# Patient Record
Sex: Female | Born: 1975 | Race: Black or African American | Hispanic: No | Marital: Married | Smoking: Never smoker
Health system: Southern US, Community
[De-identification: ages and names within clinical notes are randomized; demographics above are authoritative.]

## PROBLEM LIST (undated history)

## (undated) DIAGNOSIS — J189 Pneumonia, unspecified organism: Secondary | ICD-10-CM

## (undated) DIAGNOSIS — G8929 Other chronic pain: Secondary | ICD-10-CM

## (undated) DIAGNOSIS — Z87440 Personal history of urinary (tract) infections: Secondary | ICD-10-CM

## (undated) DIAGNOSIS — J42 Unspecified chronic bronchitis: Secondary | ICD-10-CM

## (undated) DIAGNOSIS — E7800 Pure hypercholesterolemia, unspecified: Secondary | ICD-10-CM

## (undated) DIAGNOSIS — J40 Bronchitis, not specified as acute or chronic: Secondary | ICD-10-CM

## (undated) DIAGNOSIS — D649 Anemia, unspecified: Secondary | ICD-10-CM

## (undated) DIAGNOSIS — G932 Benign intracranial hypertension: Secondary | ICD-10-CM

## (undated) DIAGNOSIS — I209 Angina pectoris, unspecified: Secondary | ICD-10-CM

## (undated) DIAGNOSIS — Z8719 Personal history of other diseases of the digestive system: Secondary | ICD-10-CM

## (undated) DIAGNOSIS — R112 Nausea with vomiting, unspecified: Secondary | ICD-10-CM

## (undated) DIAGNOSIS — K5909 Other constipation: Secondary | ICD-10-CM

## (undated) DIAGNOSIS — R569 Unspecified convulsions: Secondary | ICD-10-CM

## (undated) DIAGNOSIS — Z86718 Personal history of other venous thrombosis and embolism: Secondary | ICD-10-CM

## (undated) DIAGNOSIS — I10 Essential (primary) hypertension: Secondary | ICD-10-CM

## (undated) DIAGNOSIS — K219 Gastro-esophageal reflux disease without esophagitis: Secondary | ICD-10-CM

## (undated) DIAGNOSIS — R109 Unspecified abdominal pain: Secondary | ICD-10-CM

## (undated) DIAGNOSIS — Z9889 Other specified postprocedural states: Secondary | ICD-10-CM

## (undated) DIAGNOSIS — R7303 Prediabetes: Secondary | ICD-10-CM

## (undated) DIAGNOSIS — G43909 Migraine, unspecified, not intractable, without status migrainosus: Secondary | ICD-10-CM

## (undated) DIAGNOSIS — M545 Low back pain, unspecified: Secondary | ICD-10-CM

## (undated) DIAGNOSIS — H539 Unspecified visual disturbance: Secondary | ICD-10-CM

## (undated) HISTORY — PX: OTHER SURGICAL HISTORY: SHX169

## (undated) HISTORY — DX: Gastro-esophageal reflux disease without esophagitis: K21.9

## (undated) HISTORY — DX: Benign intracranial hypertension: G93.2

## (undated) HISTORY — PX: DILATION AND CURETTAGE OF UTERUS: SHX78

## (undated) HISTORY — PX: HERNIA REPAIR: SHX51

## (undated) HISTORY — PX: LAPAROSCOPIC CHOLECYSTECTOMY: SUR755

## (undated) HISTORY — PX: LIPOSUCTION: SHX10

## (undated) HISTORY — PX: AUGMENTATION MAMMAPLASTY: SUR837

## (undated) HISTORY — PX: BREAST ENHANCEMENT SURGERY: SHX7

## (undated) HISTORY — PX: ACHILLES TENDON LENGTHENING: SUR826

## (undated) HISTORY — PX: ABDOMINAL HYSTERECTOMY: SHX81

## (undated) HISTORY — PX: ABDOMINOPLASTY: SUR9

## (undated) HISTORY — PX: LUMBAR PUNCTURE: SHX1985

---

## 2001-03-10 ENCOUNTER — Other Ambulatory Visit: Admission: RE | Admit: 2001-03-10 | Discharge: 2001-03-10 | Payer: Self-pay | Admitting: Obstetrics and Gynecology

## 2001-03-10 ENCOUNTER — Encounter (INDEPENDENT_AMBULATORY_CARE_PROVIDER_SITE_OTHER): Payer: Self-pay

## 2001-03-22 ENCOUNTER — Encounter (INDEPENDENT_AMBULATORY_CARE_PROVIDER_SITE_OTHER): Payer: Self-pay | Admitting: Specialist

## 2001-03-22 ENCOUNTER — Observation Stay (HOSPITAL_COMMUNITY): Admission: RE | Admit: 2001-03-22 | Discharge: 2001-03-23 | Payer: Self-pay | Admitting: Obstetrics and Gynecology

## 2001-04-21 ENCOUNTER — Emergency Department (HOSPITAL_COMMUNITY): Admission: EM | Admit: 2001-04-21 | Discharge: 2001-04-21 | Payer: Self-pay | Admitting: Emergency Medicine

## 2001-05-29 ENCOUNTER — Emergency Department (HOSPITAL_COMMUNITY): Admission: EM | Admit: 2001-05-29 | Discharge: 2001-05-30 | Payer: Self-pay | Admitting: *Deleted

## 2001-05-30 ENCOUNTER — Encounter: Payer: Self-pay | Admitting: *Deleted

## 2003-01-16 ENCOUNTER — Emergency Department (HOSPITAL_COMMUNITY): Admission: EM | Admit: 2003-01-16 | Discharge: 2003-01-16 | Payer: Self-pay | Admitting: Emergency Medicine

## 2003-07-31 ENCOUNTER — Encounter: Payer: Self-pay | Admitting: Emergency Medicine

## 2003-07-31 ENCOUNTER — Emergency Department (HOSPITAL_COMMUNITY): Admission: EM | Admit: 2003-07-31 | Discharge: 2003-08-01 | Payer: Self-pay | Admitting: Emergency Medicine

## 2003-10-04 ENCOUNTER — Emergency Department (HOSPITAL_COMMUNITY): Admission: EM | Admit: 2003-10-04 | Discharge: 2003-10-04 | Payer: Self-pay | Admitting: Internal Medicine

## 2003-11-27 ENCOUNTER — Ambulatory Visit (HOSPITAL_COMMUNITY): Admission: RE | Admit: 2003-11-27 | Discharge: 2003-11-27 | Payer: Self-pay | Admitting: Family Medicine

## 2004-04-15 ENCOUNTER — Emergency Department (HOSPITAL_COMMUNITY): Admission: EM | Admit: 2004-04-15 | Discharge: 2004-04-15 | Payer: Self-pay | Admitting: Emergency Medicine

## 2004-06-08 ENCOUNTER — Emergency Department (HOSPITAL_COMMUNITY): Admission: EM | Admit: 2004-06-08 | Discharge: 2004-06-08 | Payer: Self-pay | Admitting: Emergency Medicine

## 2004-11-07 ENCOUNTER — Emergency Department (HOSPITAL_COMMUNITY): Admission: EM | Admit: 2004-11-07 | Discharge: 2004-11-07 | Payer: Self-pay | Admitting: Emergency Medicine

## 2004-11-19 ENCOUNTER — Emergency Department (HOSPITAL_COMMUNITY): Admission: EM | Admit: 2004-11-19 | Discharge: 2004-11-19 | Payer: Self-pay | Admitting: Emergency Medicine

## 2004-12-10 ENCOUNTER — Emergency Department (HOSPITAL_COMMUNITY): Admission: EM | Admit: 2004-12-10 | Discharge: 2004-12-10 | Payer: Self-pay | Admitting: Family Medicine

## 2005-02-03 ENCOUNTER — Emergency Department (HOSPITAL_COMMUNITY): Admission: EM | Admit: 2005-02-03 | Discharge: 2005-02-03 | Payer: Self-pay | Admitting: Family Medicine

## 2005-03-11 ENCOUNTER — Emergency Department (HOSPITAL_COMMUNITY): Admission: EM | Admit: 2005-03-11 | Discharge: 2005-03-11 | Payer: Self-pay | Admitting: Emergency Medicine

## 2005-04-21 ENCOUNTER — Emergency Department (HOSPITAL_COMMUNITY): Admission: EM | Admit: 2005-04-21 | Discharge: 2005-04-21 | Payer: Self-pay | Admitting: Emergency Medicine

## 2005-06-13 ENCOUNTER — Emergency Department (HOSPITAL_COMMUNITY): Admission: EM | Admit: 2005-06-13 | Discharge: 2005-06-13 | Payer: Self-pay | Admitting: Family Medicine

## 2005-07-08 ENCOUNTER — Emergency Department (HOSPITAL_COMMUNITY): Admission: EM | Admit: 2005-07-08 | Discharge: 2005-07-08 | Payer: Self-pay | Admitting: Emergency Medicine

## 2005-09-03 ENCOUNTER — Emergency Department (HOSPITAL_COMMUNITY): Admission: EM | Admit: 2005-09-03 | Discharge: 2005-09-03 | Payer: Self-pay | Admitting: Family Medicine

## 2005-09-03 ENCOUNTER — Emergency Department (HOSPITAL_COMMUNITY): Admission: EM | Admit: 2005-09-03 | Discharge: 2005-09-03 | Payer: Self-pay | Admitting: Emergency Medicine

## 2006-03-02 ENCOUNTER — Emergency Department (HOSPITAL_COMMUNITY): Admission: EM | Admit: 2006-03-02 | Discharge: 2006-03-02 | Payer: Self-pay | Admitting: Emergency Medicine

## 2006-06-17 ENCOUNTER — Ambulatory Visit (HOSPITAL_COMMUNITY): Admission: RE | Admit: 2006-06-17 | Discharge: 2006-06-17 | Payer: Self-pay | Admitting: Family Medicine

## 2006-10-06 HISTORY — PX: COLONOSCOPY: SHX174

## 2006-12-28 ENCOUNTER — Emergency Department (HOSPITAL_COMMUNITY): Admission: EM | Admit: 2006-12-28 | Discharge: 2006-12-28 | Payer: Self-pay | Admitting: Family Medicine

## 2006-12-30 ENCOUNTER — Emergency Department (HOSPITAL_COMMUNITY): Admission: EM | Admit: 2006-12-30 | Discharge: 2006-12-30 | Payer: Self-pay | Admitting: Emergency Medicine

## 2007-06-01 ENCOUNTER — Emergency Department: Payer: Self-pay | Admitting: Emergency Medicine

## 2007-06-01 ENCOUNTER — Other Ambulatory Visit: Payer: Self-pay

## 2007-07-26 ENCOUNTER — Other Ambulatory Visit: Admission: RE | Admit: 2007-07-26 | Discharge: 2007-07-26 | Payer: Self-pay | Admitting: Obstetrics and Gynecology

## 2007-08-13 ENCOUNTER — Ambulatory Visit: Payer: Self-pay | Admitting: Internal Medicine

## 2007-08-20 ENCOUNTER — Ambulatory Visit (HOSPITAL_COMMUNITY): Admission: RE | Admit: 2007-08-20 | Discharge: 2007-08-20 | Payer: Self-pay | Admitting: Internal Medicine

## 2007-08-20 ENCOUNTER — Ambulatory Visit: Payer: Self-pay | Admitting: Internal Medicine

## 2007-08-25 ENCOUNTER — Ambulatory Visit (HOSPITAL_COMMUNITY): Admission: RE | Admit: 2007-08-25 | Discharge: 2007-08-25 | Payer: Self-pay | Admitting: *Deleted

## 2007-10-07 HISTORY — PX: ESOPHAGOGASTRODUODENOSCOPY: SHX1529

## 2008-04-21 ENCOUNTER — Ambulatory Visit (HOSPITAL_COMMUNITY): Admission: RE | Admit: 2008-04-21 | Discharge: 2008-04-21 | Payer: Self-pay | Admitting: Family Medicine

## 2008-04-25 ENCOUNTER — Ambulatory Visit (HOSPITAL_COMMUNITY): Admission: RE | Admit: 2008-04-25 | Discharge: 2008-04-25 | Payer: Self-pay | Admitting: Obstetrics & Gynecology

## 2008-05-04 ENCOUNTER — Ambulatory Visit (HOSPITAL_COMMUNITY): Admission: RE | Admit: 2008-05-04 | Discharge: 2008-05-04 | Payer: Self-pay | Admitting: Family Medicine

## 2008-05-09 ENCOUNTER — Ambulatory Visit (HOSPITAL_COMMUNITY): Admission: RE | Admit: 2008-05-09 | Discharge: 2008-05-09 | Payer: Self-pay | Admitting: Family Medicine

## 2008-05-18 ENCOUNTER — Ambulatory Visit: Payer: Self-pay | Admitting: Internal Medicine

## 2008-05-22 ENCOUNTER — Ambulatory Visit (HOSPITAL_COMMUNITY): Admission: RE | Admit: 2008-05-22 | Discharge: 2008-05-22 | Payer: Self-pay | Admitting: Internal Medicine

## 2008-05-22 ENCOUNTER — Ambulatory Visit: Payer: Self-pay | Admitting: Internal Medicine

## 2008-05-24 ENCOUNTER — Encounter: Payer: Self-pay | Admitting: Orthopedic Surgery

## 2008-05-24 ENCOUNTER — Emergency Department (HOSPITAL_COMMUNITY): Admission: EM | Admit: 2008-05-24 | Discharge: 2008-05-24 | Payer: Self-pay | Admitting: Emergency Medicine

## 2008-05-29 ENCOUNTER — Ambulatory Visit: Payer: Self-pay | Admitting: Orthopedic Surgery

## 2008-05-29 DIAGNOSIS — M66369 Spontaneous rupture of flexor tendons, unspecified lower leg: Secondary | ICD-10-CM

## 2008-06-08 ENCOUNTER — Ambulatory Visit (HOSPITAL_COMMUNITY): Admission: RE | Admit: 2008-06-08 | Discharge: 2008-06-08 | Payer: Self-pay | Admitting: Family Medicine

## 2008-06-19 ENCOUNTER — Ambulatory Visit: Payer: Self-pay | Admitting: Internal Medicine

## 2008-06-28 ENCOUNTER — Ambulatory Visit (HOSPITAL_COMMUNITY): Admission: RE | Admit: 2008-06-28 | Discharge: 2008-06-28 | Payer: Self-pay | Admitting: Family Medicine

## 2008-06-29 ENCOUNTER — Telehealth: Payer: Self-pay | Admitting: Orthopedic Surgery

## 2008-07-04 ENCOUNTER — Ambulatory Visit: Payer: Self-pay | Admitting: Unknown Physician Specialty

## 2008-07-05 ENCOUNTER — Ambulatory Visit: Payer: Self-pay | Admitting: Unknown Physician Specialty

## 2008-07-14 ENCOUNTER — Ambulatory Visit (HOSPITAL_COMMUNITY): Admission: RE | Admit: 2008-07-14 | Discharge: 2008-07-14 | Payer: Self-pay | Admitting: Family Medicine

## 2008-07-17 ENCOUNTER — Ambulatory Visit: Payer: Self-pay | Admitting: Cardiology

## 2008-07-17 ENCOUNTER — Inpatient Hospital Stay (HOSPITAL_COMMUNITY): Admission: AD | Admit: 2008-07-17 | Discharge: 2008-08-04 | Payer: Self-pay | Admitting: Family Medicine

## 2008-07-18 ENCOUNTER — Encounter: Payer: Self-pay | Admitting: Cardiology

## 2008-07-18 ENCOUNTER — Ambulatory Visit: Payer: Self-pay | Admitting: Internal Medicine

## 2008-07-19 ENCOUNTER — Ambulatory Visit: Payer: Self-pay | Admitting: Gastroenterology

## 2008-07-23 ENCOUNTER — Ambulatory Visit: Payer: Self-pay | Admitting: Gastroenterology

## 2008-07-24 ENCOUNTER — Ambulatory Visit: Payer: Self-pay | Admitting: Internal Medicine

## 2008-07-25 ENCOUNTER — Ambulatory Visit: Payer: Self-pay | Admitting: Gastroenterology

## 2008-07-26 ENCOUNTER — Ambulatory Visit: Payer: Self-pay | Admitting: Gastroenterology

## 2008-07-27 ENCOUNTER — Ambulatory Visit: Payer: Self-pay | Admitting: Internal Medicine

## 2008-07-31 ENCOUNTER — Ambulatory Visit: Payer: Self-pay | Admitting: Gastroenterology

## 2008-08-02 ENCOUNTER — Encounter (INDEPENDENT_AMBULATORY_CARE_PROVIDER_SITE_OTHER): Payer: Self-pay | Admitting: General Surgery

## 2008-08-10 ENCOUNTER — Ambulatory Visit: Payer: Self-pay | Admitting: Internal Medicine

## 2009-02-01 ENCOUNTER — Ambulatory Visit (HOSPITAL_COMMUNITY): Admission: RE | Admit: 2009-02-01 | Discharge: 2009-02-01 | Payer: Self-pay | Admitting: Family Medicine

## 2009-08-06 ENCOUNTER — Emergency Department (HOSPITAL_COMMUNITY): Admission: EM | Admit: 2009-08-06 | Discharge: 2009-08-06 | Payer: Self-pay | Admitting: Emergency Medicine

## 2009-12-25 ENCOUNTER — Ambulatory Visit (HOSPITAL_COMMUNITY): Admission: RE | Admit: 2009-12-25 | Discharge: 2009-12-25 | Payer: Self-pay | Admitting: Family Medicine

## 2010-01-14 ENCOUNTER — Ambulatory Visit (HOSPITAL_COMMUNITY): Admission: RE | Admit: 2010-01-14 | Discharge: 2010-01-14 | Payer: Self-pay | Admitting: Family Medicine

## 2010-06-21 ENCOUNTER — Ambulatory Visit (HOSPITAL_COMMUNITY): Admission: RE | Admit: 2010-06-21 | Discharge: 2010-06-21 | Payer: Self-pay | Admitting: Family Medicine

## 2010-07-01 ENCOUNTER — Emergency Department (HOSPITAL_COMMUNITY): Admission: EM | Admit: 2010-07-01 | Discharge: 2010-07-01 | Payer: Self-pay | Admitting: Emergency Medicine

## 2010-08-21 ENCOUNTER — Emergency Department (HOSPITAL_COMMUNITY): Admission: EM | Admit: 2010-08-21 | Discharge: 2010-08-21 | Payer: Self-pay | Admitting: Emergency Medicine

## 2010-10-09 ENCOUNTER — Emergency Department: Payer: Self-pay | Admitting: Emergency Medicine

## 2010-10-28 ENCOUNTER — Encounter: Payer: Self-pay | Admitting: Emergency Medicine

## 2010-11-21 ENCOUNTER — Ambulatory Visit (HOSPITAL_COMMUNITY)
Admission: RE | Admit: 2010-11-21 | Discharge: 2010-11-21 | Disposition: A | Discharge status: Home / Self Care | Payer: Medicaid Other | Source: Ambulatory Visit | Attending: Family Medicine | Admitting: Family Medicine

## 2010-11-21 ENCOUNTER — Other Ambulatory Visit (HOSPITAL_COMMUNITY): Payer: Self-pay | Admitting: Family Medicine

## 2010-11-21 DIAGNOSIS — R05 Cough: Secondary | ICD-10-CM | POA: Insufficient documentation

## 2010-11-21 DIAGNOSIS — R059 Cough, unspecified: Secondary | ICD-10-CM | POA: Insufficient documentation

## 2010-11-21 DIAGNOSIS — R509 Fever, unspecified: Secondary | ICD-10-CM

## 2010-11-21 DIAGNOSIS — R062 Wheezing: Secondary | ICD-10-CM | POA: Insufficient documentation

## 2010-11-21 DIAGNOSIS — I10 Essential (primary) hypertension: Secondary | ICD-10-CM | POA: Insufficient documentation

## 2010-12-18 ENCOUNTER — Other Ambulatory Visit: Payer: Self-pay | Admitting: Neurology

## 2010-12-18 DIAGNOSIS — D496 Neoplasm of unspecified behavior of brain: Secondary | ICD-10-CM

## 2010-12-19 LAB — DIFFERENTIAL
Basophils Absolute: 0.1 10*3/uL (ref 0.0–0.1)
Basophils Relative: 1 % (ref 0–1)
Eosinophils Absolute: 1 10*3/uL — ABNORMAL HIGH (ref 0.0–0.7)
Neutro Abs: 4.7 10*3/uL (ref 1.7–7.7)
Neutrophils Relative %: 61 % (ref 43–77)

## 2010-12-19 LAB — BASIC METABOLIC PANEL
BUN: 7 mg/dL (ref 6–23)
Calcium: 8.7 mg/dL (ref 8.4–10.5)
Creatinine, Ser: 0.69 mg/dL (ref 0.4–1.2)
GFR calc non Af Amer: >60 mL/min (ref >60)
Glucose, Bld: 93 mg/dL (ref 70–99)
Sodium: 139 mEq/L (ref 135–145)

## 2010-12-19 LAB — CBC
Hemoglobin: 13.1 g/dL (ref 12.0–15.0)
MCH: 29.9 pg (ref 26.0–34.0)
MCHC: 33.9 g/dL (ref 30.0–36.0)
Platelets: 362 10*3/uL (ref 150–400)
RDW: 13.8 % (ref 11.5–15.5)

## 2010-12-23 ENCOUNTER — Ambulatory Visit (HOSPITAL_COMMUNITY)
Admission: RE | Admit: 2010-12-23 | Discharge: 2010-12-23 | Disposition: A | Discharge status: Home / Self Care | Payer: Medicaid Other | Source: Ambulatory Visit | Attending: Neurology | Admitting: Neurology

## 2010-12-23 DIAGNOSIS — G932 Benign intracranial hypertension: Secondary | ICD-10-CM | POA: Insufficient documentation

## 2010-12-23 DIAGNOSIS — D496 Neoplasm of unspecified behavior of brain: Secondary | ICD-10-CM

## 2010-12-23 DIAGNOSIS — R51 Headache: Secondary | ICD-10-CM | POA: Insufficient documentation

## 2010-12-23 LAB — CSF CELL COUNT WITH DIFFERENTIAL: Tube #: 3

## 2010-12-23 LAB — PROTEIN AND GLUCOSE, CSF: Total  Protein, CSF: 19 mg/dL (ref 15–45)

## 2010-12-25 ENCOUNTER — Emergency Department (HOSPITAL_COMMUNITY)
Admission: EM | Admit: 2010-12-25 | Discharge: 2010-12-25 | Disposition: A | Discharge status: Home / Self Care | Payer: Medicaid Other | Attending: Emergency Medicine | Admitting: Emergency Medicine

## 2010-12-25 ENCOUNTER — Emergency Department (HOSPITAL_COMMUNITY): Payer: Medicaid Other

## 2010-12-25 DIAGNOSIS — R110 Nausea: Secondary | ICD-10-CM | POA: Insufficient documentation

## 2010-12-25 DIAGNOSIS — H53149 Visual discomfort, unspecified: Secondary | ICD-10-CM | POA: Insufficient documentation

## 2010-12-25 DIAGNOSIS — R51 Headache: Secondary | ICD-10-CM | POA: Insufficient documentation

## 2010-12-26 ENCOUNTER — Other Ambulatory Visit: Payer: Self-pay | Admitting: Neurology

## 2010-12-26 DIAGNOSIS — G971 Other reaction to spinal and lumbar puncture: Secondary | ICD-10-CM

## 2010-12-27 ENCOUNTER — Ambulatory Visit
Admission: RE | Admit: 2010-12-27 | Discharge: 2010-12-27 | Disposition: A | Discharge status: Home / Self Care | Payer: Medicaid Other | Source: Ambulatory Visit | Attending: Neurology | Admitting: Neurology

## 2010-12-27 ENCOUNTER — Other Ambulatory Visit: Payer: Medicaid Other

## 2010-12-27 DIAGNOSIS — G971 Other reaction to spinal and lumbar puncture: Secondary | ICD-10-CM

## 2011-01-08 LAB — COMPREHENSIVE METABOLIC PANEL
AST: 16 U/L (ref 0–37)
Albumin: 3.5 g/dL (ref 3.5–5.2)
BUN: 8 mg/dL (ref 6–23)
Chloride: 104 mEq/L (ref 96–112)
Creatinine, Ser: 0.65 mg/dL (ref 0.4–1.2)
GFR calc Af Amer: >60 mL/min (ref >60)
Potassium: 3.4 mEq/L — ABNORMAL LOW (ref 3.5–5.1)
Total Bilirubin: 0.6 mg/dL (ref 0.3–1.2)
Total Protein: 6.7 g/dL (ref 6.0–8.3)

## 2011-01-08 LAB — DIFFERENTIAL
Eosinophils Relative: 4 % (ref 0–5)
Lymphocytes Relative: 24 % (ref 12–46)
Monocytes Absolute: 0.5 10*3/uL (ref 0.1–1.0)
Monocytes Relative: 7 % (ref 3–12)
Neutro Abs: 4.6 10*3/uL (ref 1.7–7.7)

## 2011-01-08 LAB — POCT CARDIAC MARKERS
CKMB, poc: <1 ng/mL — ABNORMAL LOW (ref 1.0–8.0)
CKMB, poc: <1 ng/mL — ABNORMAL LOW (ref 1.0–8.0)
Myoglobin, poc: 13.2 ng/mL (ref 12–200)
Troponin i, poc: <0.05 ng/mL (ref 0.00–0.09)

## 2011-01-08 LAB — CBC
HCT: 38 % (ref 36.0–46.0)
MCV: 88.5 fL (ref 78.0–100.0)
Platelets: 322 10*3/uL (ref 150–400)
RDW: 13.2 % (ref 11.5–15.5)
WBC: 7.1 10*3/uL (ref 4.0–10.5)

## 2011-01-08 LAB — D-DIMER, QUANTITATIVE: D-Dimer, Quant: 0.76 ug/mL-FEU — ABNORMAL HIGH (ref 0.00–0.48)

## 2011-01-12 ENCOUNTER — Emergency Department (HOSPITAL_COMMUNITY): Payer: Medicaid Other

## 2011-01-12 ENCOUNTER — Emergency Department (HOSPITAL_COMMUNITY)
Admission: EM | Admit: 2011-01-12 | Discharge: 2011-01-12 | Disposition: A | Discharge status: Home / Self Care | Payer: Medicaid Other | Attending: Emergency Medicine | Admitting: Emergency Medicine

## 2011-01-12 DIAGNOSIS — R0602 Shortness of breath: Secondary | ICD-10-CM | POA: Insufficient documentation

## 2011-01-12 DIAGNOSIS — I10 Essential (primary) hypertension: Secondary | ICD-10-CM | POA: Insufficient documentation

## 2011-01-12 DIAGNOSIS — J45909 Unspecified asthma, uncomplicated: Secondary | ICD-10-CM | POA: Insufficient documentation

## 2011-01-12 DIAGNOSIS — K219 Gastro-esophageal reflux disease without esophagitis: Secondary | ICD-10-CM | POA: Insufficient documentation

## 2011-02-18 NOTE — Group Therapy Note (Signed)
[name], [name]               ACCOUNT NO.:  [id number]   MEDICAL RECORD NO.:  [id number]          PATIENT TYPE:  INP   LOCATION:  A317                          FACILITY:  [facility]   PHYSICIAN:  Kofi A. Gerilyn Pilgrim, M.D. DATE OF BIRTH:  Dec 03, 1975   DATE OF PROCEDURE:  DATE OF DISCHARGE:                                 PROGRESS NOTE   The patient does not report having headaches or blurred vision.  She  continues to have significant discomfort involving the chest,  epigastrium, and the abdominal region.  It appears that the feeling that  she has significant gastroesophageal reflux disease.  Temperature 98.2,  pulse 85, and blood pressure 103/67.  She is awake and alert and  converses well.  Speech, language, and cognition are intact.  She moves  all 4 extremities.   ASSESSMENT AND PLAN:  Pseudotumor cerebri, which seems to responded well  to spinal tap.  I would continue with the Diamox and Topamax for  continued medical treatment.  We will sign off, consult p.r.n.      Kofi A. Gerilyn Pilgrim, M.D.  Electronically Signed     KAD/MEDQ  D:  07/28/2008  T:  07/29/2008  Job:  [id number]

## 2011-02-18 NOTE — Consult Note (Signed)
NAMEROSENE, PILLING NO.:  [id number]   MEDICAL RECORD NO.:  [id number]          PATIENT TYPE:  INP   LOCATION:  A317                          FACILITY:  [facility]   PHYSICIAN:  Tilford Pillar, MD      DATE OF BIRTH:  1976-06-02   DATE OF CONSULTATION:  DATE OF DISCHARGE:                                 CONSULTATION   REASON FOR CONSULTATION:  Abdominal pain.   HISTORY OF PRESENT ILLNESS:  The patient is a 35 year old female who  presented to [facility] with a history of chronic constipation,  gastroesophageal reflux disease, and known right ovarian vein  thrombosis.  The patient presented with relatively diffuse abdominal  pain and has been admitted for extensive workup.  At this time, she  complains of mostly left-sided abdominal pain, but also epigastric and  right upper quadrant abdominal discomfort.  She does have occasional  episodes of nausea and has had an episode of emesis.  This last episode  of emesis was consistent with coffee-ground emesis consistent with  hematemesis.  No frank blood was evident.  She has had history of blood  with bowel movements.  However, no recent episodes and has had a recent  colonoscopy as well as upper endoscopy.  She is unaware of any family  history of biliary disease, but she has had previous pregnancies and  complains of increased symptomatology with oral ingestion just with her  fatty greasy foods. In addition, the patient has had a HIDA scan and in  discussion with the patient, her symptoms were exacerbated by the oral  administration of __________   PAST MEDICAL HISTORY:  1. Chronic constipation.  2. GERD.  3. Hypertension.  4. Hypercholesterolemia.  5. History of hydrocephaly.  6. History of right ovarian vein thrombosis.   PAST SURGICAL HISTORY:  She has had a left Achilles tendon repair.  She  has had ruptured disk as well as partial hysterectomy.   MEDICATIONS:  1. Lipitor.  2. Coumadin.  3.  Singulair.  4. Percocet.  5. Lisinopril and hydrochlorothiazide.  6. Prilosec.  7. Xanax.   ALLERGIES:  No known drug allergies.   SOCIAL HISTORY:  No tobacco use.  Occasional alcohol use.  Distant  marijuana use.  She has had three pregnancies.   PHYSICAL EXAMINATION:  VITAL SIGNS:  Temperature 98.1, heart rate 84,  respirations 21, blood pressure 142/68, she has 98% O2 saturation on  room air.  She has had 3 episode of urine output today.  Volumes were  not recorded.  GENERAL:  She is an obese female in no acute distress.  She is alert and  oriented x3.  HEENT:  Pupils are equal, round, reactive.  Extraocular movements are  intact.  No conjunctival pallor or scleral icterus noted.  Oral mucosa  is pink and moist.  Normal occlusion.  NECK:  Trachea is midline.  No cervical lymphadenopathy apparent.  PULMONARY:  Unlabored respirations.  She is clear to auscultation  bilaterally.  CARDIOVASCULAR:  Regular rate and rhythm.  She has 2+ radial pulse  bilaterally.  ABDOMEN:  Positive  bowel sounds.  Abdomen is obese, soft.  She has  moderate epigastric and right upper quadrant abdominal pain.  No  peritoneal signs are elicited.  No masses.  No hernias.  EXTREMITIES:  Warm and dry.   PERTINENT LABORATORY AND RADIOGRAPHIC STUDIES:  CBC; white blood cell  count 8.7, hemoglobin 11.2, hematocrit 32.4, platelets 420.  This was on  July 30, 2008.  Her INR state was 1.4.  HIDA scan demonstrates a 9%  ejection fraction and again her symptomatology is exacerbated with the  oral administration of __________.   ASSESSMENT AND PLAN:  Biliary dyskinesia.  At this point, I did have a  relatively lengthy conversation with the patient in regards to her  physical findings as well as her laboratory and radiographic study.  As  she does have a history of chronic constipation, reflux, as well as  gastritis, it also appears that she does have __________ disease and  biliary dyskinesia.  I have  discussed with her and her husband who was  present at the time of the evaluation and discussion.  I have a  suspicion all of her symptomatology is associated with her biliary  dyskinesia is relatively well; however, I do think that the benefit of  discussing laparoscopic possible cholecystectomy.  I did discuss the  risks, benefits, and alternatives at length with the patient and her  husband including but not limited to risk of bleeding, infection, bile  leak as well as possibility of common bile duct injury as well as  possibility of intraoperative cardiac and pulmonary events.  At this  time as she is on Coumadin, this will need to be discontinued prior to  any surgical intervention.  However, at this time, __________ levels are  nontherapeutic and at this time, I would discontinue her Lovenox.  I  would anticipate reevaluating her INR in the morning prior to any  planned operative intervention.  Their questions and concerns were  addressed.  The patient does wish to proceed with a laparoscopic  possible open cholecystectomy on admission.  At this time, I will plan  for surgical intervention likely tomorrow pending the results of her  morning coag studies.  At this point, she will be kept n.p.o. after  midnight and __________ intervention can would be continued on IV fluid  hydration and her Lovenox can be discontinued after midnight tonight  __________  operation.      Tilford Pillar, MD  Electronically Signed     BZ/MEDQ  D:  08/02/2008  T:  08/02/2008  Job:  [id number]   cc:   Angus G. Renard Matter, MD  Fax: [phone]

## 2011-02-18 NOTE — Op Note (Signed)
NAMECHERRE, KOTHARI               ACCOUNT NO.:  [id number]   MEDICAL RECORD NO.:  [id number]          PATIENT TYPE:  INP   LOCATION:  A317                          FACILITY:  [facility]   PHYSICIAN:  R. Roetta Sessions, M.D. DATE OF BIRTH:  Dec 17, 1975   DATE OF PROCEDURE:  07/24/2008  DATE OF DISCHARGE:                               OPERATIVE REPORT   INDICATIONS FOR PROCEDURE:  A 35 year old obese African American female  with symptoms of chronic nausea, reflux, and regurgitation, atypical  chest pain in spite of Protonix 40 mg b.i.d.  Bravo study now being  performed.  Risks, benefits, alternatives, and limitations have been  reviewed.  Please see the EGD report under separate cover.  EGD was  normal.   Please note this study was done while on Protonix 40 mg orally q.12 h.   FINDINGS:  This was an abnormal study indicating excessive  gastroesophageal reflux breaking through acid suppression therapy.  She  had multiple episodes of gastroesophageal reflux through the 48-hour  study with relatively good correlation with symptoms of regurgitation  and chest pain during the study done.  Her DeMeester score on day 1 was  11.2.  Her DeMeester score in the day 2 was 17.4.  Acid reflux analysis  total during the study.  Number of refluxes during the study period 139.  Number of long refluxes greater than 5 minutes one; duration of longest  reflux in minutes 11.  Time pH less than 4 in minutes during study 76,  fraction time pH less than 3%.   This study was done, closely monitored, environment.  The patient was on  Protonix during the study.  This study indicates poorly-controlled  gastroesophageal reflux with breakthrough symptoms on b.i.d. PPI  therapy.   Clinically, this is a drug failure.   RECOMMENDATIONS:  1. We will begin the patient immediately on Zegerid 40 mg capsule one      q.12 h.  Hopefully, we will get a much better clinical response      with this agent within the next few  days.  2. I have also advocated antireflux measures including dietary      modification and weight loss as part of the multipronged approach      to the treatment of gastroesophageal reflux disease.      Jonathon Bellows, M.D.  Electronically Signed     RMR/MEDQ  D:  07/27/2008  T:  07/27/2008  Job:  [id number]   cc:   Angus G. Renard Matter, MD  Fax: [phone]   Hospital Chart

## 2011-02-18 NOTE — Consult Note (Signed)
[name], [name]               ACCOUNT NO.:  [id number]   MEDICAL RECORD NO.:  [id number]          PATIENT TYPE:  INP   LOCATION:  A317                          FACILITY:  [facility]   PHYSICIAN:  R. Roetta Sessions, M.D. DATE OF BIRTH:  April 07, 1976   DATE OF CONSULTATION:  DATE OF DISCHARGE:                                 CONSULTATION   REFERRING PHYSICIAN:  Dr. Renard Matter.   REASON FOR CONSULTATION:  Abnormal stomach on CT.   HISTORY OF PRESENT ILLNESS:  [name] is a 35 year old African American  female.  She is well-known to Dr. Jena Gauss with history of chronic  constipation, right ovarian vein thrombosis, and GERD.  She did have an  EGD which was normal on May 22, 2008 by Dr. Jena Gauss.  She was found to  have a markedly distended stomach with possible goo.  The stomach was  distended with contrast.  She has had stable chronic right ovarian vein  thrombosis and a new mild infiltrate in the right upper lobe suggestive  of pneumonia on CT from July 14, 2008.  She tells me she has had chest  pain for the last 2 or 3 years, and it has worsened over the last 2  months or so.  She has had sharp shooting pain across her chest.  Her  chest hurts with inspiration.  She continues to complain of chronic  constipation.  She has been taking Epsom salts previously.  She has  taken Amitiza 24 mcg in the morning and 8 mcg in the evening which does  seem to help, but she has run out.  She also notes that if she takes it  several days in a row, it does not work as well.  She has had  intermittent nausea and vomiting and some gagging.  She tells me that  she has not had any vomiting in several days, however.  She does have  heartburn, indigestion.  She has been taking some over-the-counter  Prilosec, but admits to only taking it on a p.r.n. basis, not every day.  She denies any melena.  She has had scant hematochezia.  She notes that  MiraLax did not seem to help with her constipation.  She last took 5  Dulcolax which did seem to help, but she constantly feels as though she  has incomplete evacuation.   PAST MEDICAL/SURGICAL HISTORY:  1. Chronic constipation.  2. Chronic GERD with normal EGD May 22, 2008 on PPI.  She had an      upper GI series, barium swallow esophagram on August 4th which      showed mild duodenitis.  This was not confirmed on EGD from May 22, 2008.  3. She had a colonoscopy by Dr. Jena Gauss August 20, 2007.  It was found      to have a friable anal canal.  4. She had a left Achilles tendon repair recently in [place].  She      still has sutures in place.  5. She has hypertension.  6. Hypercholesterolemia.  7. History of ruptured disk.  8.  Partial hysterectomy for menorrhagia.  9. She has history of hydrocephalous and some type of vaginal surgery      as a child.   MEDICATIONS PRIOR TO ADMISSION:  1. Lipitor 20 mg daily.  2. Coumadin 5 mg daily.  3. Singulair 10 mg daily.  4. Percocet 10/650 mg q.4 h. p.r.n.  5. Lisinopril/HCTZ 10/12.5 mg daily.  6. Prilosec 20 mg p.r.n.  7. Xanax 0.5 mg p.r.n.   ALLERGIES:  No known drug allergies.   FAMILY HISTORY:  There is no known family history of  GI carcinoma or  lower chronic GI problems.  Mother has diabetes.  Father was killed in a  car accident at age 60.   SOCIAL HISTORY:  [name] is married.  She has 3 healthy children.  She  is pursuing her GED.  She rarely consumes alcohol usually on holidays.  She does have remote history of marijuana use.  She has been unemployed  for about a year.  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   REVIEW OF SYSTEMS:  See HPI.  PULMONARY:  She has had some cough, some  shortness of breath on exertion; otherwise, negative.  CONSTITUTIONAL:  Some fatigue; otherwise, negative review of systems.   PHYSICAL EXAMINATION:  VITAL SIGNS:  Weight 111.7 kg, height 69 inches,  temp 97.3, pulse 62, respirations 18, blood pressure 109/60.  GENERAL:  [name]  is an obese African American female who is alert,  oriented, pleasant, cooperative, in no acute distress.  HEENT.  Sclerae clear, nonicteric, conjunctivae pink.  Oropharynx pink  and moist without any lesions.  NECK:  Supple without any masses or thyromegaly.  CHEST:  Heart regular rate and rhythm, normal S1, S2, no murmurs,  clicks, rubs or gallops.  LUNGS:  With decreased breath sounds bilaterally.  She does have  expiratory wheeze on the right.  No acute distress.  ABDOMEN:  Protuberant with positive bowel sounds x4.  No bruits  auscultated.  Soft, nontender, nondistended without palpable mass or  hepatosplenomegaly.  No rebound tenderness or guarding.  EXTREMITIES:  Without clubbing or edema.  She does have left ankle  sutures in place from previous surgery.   LABORATORY STUDIES:  WBC 7.3, hemoglobin 11.8, hematocrit 33.7,  platelets 406,000.  PT 20, INR 1.6.  Calcium 9.3.  Sodium 137, potassium  2.1, chloride 100, CO2 29, BUN 12, creatinine 0.71, glucose 106.  Total  bilirubin 0.2, alkaline phosphatase 54.  AST 18, ALT 20.  Total protein  7.1.  Albumin 3.7.   IMPRESSION:  [name] is a 35 year old female with multiple chronic GI  problems.  She is well-known to Dr. Luvenia Starch service with history of  chronic constipation, chronic abdominal pelvic pain with a right ovarian  vein thrombosis and chronic gastroesophageal reflux disease.  She has a  history of a recent normal esophagogastroduodenoscopy by Dr. Jena Gauss April 21, 2008.  Abnormal appearance of markedly distended stomach and  possible gastric outlet obstruction as no contrast emptied into the  small bowel is nonspecific.  There is no history of goo on recent EGD or  upper GI series.   Acute problems include pneumonia and chest pain with pending cardiac  workup along with hypokalemia and left Achilles tendon repair healing  well.   PLAN:  1. Would increase Amitiza to 24 mcg b.i.d. once she recovers from      acute  illness.  2. Can consider sitz marker study on an outpatient  basis for her      chronic constipation.  3. She needs PPI daily.  4. Agree with cardiology evaluation.  5. Will have Dr. Jena Gauss review CT images and make any further      suggestions regarding her distended stomach full of contrast.   We would like to thank Dr. Renard Matter for allowing [place] to participate in the  care of [name].      Lorenza Burton, N.P.      Jonathon Bellows, M.D.  Electronically Signed    KJ/MEDQ  D:  07/18/2008  T:  07/18/2008  Job:  [id number]   cc:   Angus G. Renard Matter, MD  Fax: [phone]

## 2011-02-18 NOTE — Group Therapy Note (Signed)
[name], [name]               ACCOUNT NO.:  [id number]   MEDICAL RECORD NO.:  [id number]          PATIENT TYPE:  INP   LOCATION:  A317                          FACILITY:  [facility]   PHYSICIAN:  Angus G. Renard Matter, MD   DATE OF BIRTH:  03/22/1976   DATE OF PROCEDURE:  DATE OF DISCHARGE:                                 PROGRESS NOTE   This patient continues to have anterior chest pain and pain in upper  abdomen.  She does have recurrent pseudotumor cerebri had been treated  with Diamox, also had been evaluated by Gastroenterology Service and  felt to have reflux.  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  Recent cardiac panel; CPK 43, CPK-  MB 0.7, troponin 0.03.   PHYSICAL EXAMINATION:  VITAL SIGNS:  Blood pressure 103/63, respirations  18, pulse 85, temperature 98.2.  Recent CBC within normal range.  LUNGS:  Clear.  HEART:  Regular rhythm.  ABDOMEN:  No palpable organs or masses.   ASSESSMENT:  The patient has multiple medical problems, history of  ovarian vein thrombosis, history of left Achilles tendon repair, known  duodenitis, gastroesophageal reflux, pneumonia which is improved,  pseudotumor cerebri, possible underlying coronary artery disease.   PLAN:  To obtain Cardiology consult.  Continue other medications as  ordered.      Angus G. Renard Matter, MD  Electronically Signed     AGM/MEDQ  D:  07/28/2008  T:  07/28/2008  Job:  [id number]

## 2011-02-18 NOTE — Assessment & Plan Note (Signed)
NAMEMarland Kitchen  BRANTLEY, NASER                CHART#:  [id number]   DATE:  06/19/2008                       DOB:  1975/11/14   CHIEF COMPLAINT:  Followup of abdominal pain, nausea, and recent EGD.   PROBLEM LIST:  1. Chronic constipation.  2. Recent abdominal pain with abnormal CT of the abdomen and pelvis      April 21, 2008, demonstrating right ovarian vein thrombus.  Pelvic      ultrasound revealed a small left ovarian or paraovarian cyst.  3. Upper GI series.  Barium pill esophagram revealed mild duodenitis,      but otherwise unremarkable.  4. Recent EGD May 22, 2008, by Dr. Jena Gauss was normal.  5. Colonoscopy on August 20, 2007, by Dr. Jena Gauss done for      constipation and hematochezia with intermittent diarrhea revealed      friable anal canal, but normal colon and terminal ileum.   SUBJECTIVE:  The patient is here for followup.  She had vomiting for a  few days recently.  She notes that she is under a great deal of stress  and she believes this may be why she is having some of her nausea and  anorexia.  Since we saw her, she has ruptured her left Achilles tendon  and is in a cast.  She feels that she needs to have surgery, but her  current orthopedic surgeon does not want to pursue surgery given her  history of ovarian vein thrombus on Coumadin.  She notes that she  continues to have hot bowel movements about every other day.  She is on  Amitiza 24 mcg daily.  She notes that she does have some nauseated  medications, but admittedly does not take it with food.  She has  Percocet for her leg pain and was taking 3-4 daily, but states she has  not had any in 3-4 days, however.  She notes she generally does not have  an appetite until later in the day.  She has had some vague heartburn-  like symptoms.  Denies any melena or bright red blood per rectum.   CURRENT MEDICATIONS:  1. Lipitor which she does not take daily.  2. Warfarin 10 mg daily.  3. Singulair 10 mg daily.  4. Percocet  7.5/500 mg 3-4 a day as needed.  5. Lisinopril/HCTZ 10/12.5 mg daily.  6. Amitiza 24 mcg daily.   ALLERGIES:  No known drug allergies.   PHYSICAL EXAMINATION:  VITAL SIGNS:  Weight unobtainable today, temp  98.3, blood pressure 120/88, and pulse 72.  GENERAL:  A pleasant, obese, African-American female in no acute  distress.  SKIN:  Warm and dry.  No jaundice.  HEENT:  Sclerae nonicteric.  ABDOMEN:  Positive bowel sounds.  Abdomen is obese and symmetrical.  She  has mild tenderness throughout the lower abdomen predominantly in the  right lower side.  No rebound or guarding.  No organomegaly or masses.  No abdominal bruits or hernias.  LOWER EXTREMITIES:  Left lower extremity covered with cast.   LABORATORY DATA:  Total bilirubin 0.3, alkaline phosphatase 62, AST 14,  ALT 18, and albumin 4.8.  Recently her ALT was one point elevated at 36.   IMPRESSION:  The patient is a 35 year old lady who has multiple  gastrointestinal complaints including abdominal pain, constipation,  anorexia,  nausea, and vague heartburn.  She has had quite an extensive  gastrointestinal evaluation at this point.  Beginning in November 2008,  she had a colonoscopy.  She has had a recent EGD.  LFTs recently normal.  She also had a CT which did reveal right ovarian vein thrombosis which  is likely the source of some of her abdominal pain.  She admittedly is  under a lot of stress which she feels is exacerbating her symptoms.  I  believe her recent narcotic use has also compounded her constipation and  possibly has affected her GI motility overall.  I suspect some of her  nausea is related to Amitiza being taking without food.  I provided  reassurance to the patient today that she has had a quite of extensive  evaluation.  We need to optimize treatment of her constipation and  provide coverage for uncomplicated gastroesophageal reflux disease.  If  she continued to have ongoing symptoms, we consider further work  up at  that time.   PLAN:  1. Amitiza 24 mcg in the morning, 8 mcg at bedtime, #12 of 8 mcg      provided.  2. Trial of Nexium 40 mg daily, #20 samples provided.  3. Advised her to limit her Percocet use as much as possible from a GI      standpoint.  4. Office visit if she continues to have ongoing symptoms.       Tana Coast, P.A.  Electronically Signed     R. Roetta Sessions, M.D.  Electronically Signed    LL/MEDQ  D:  06/19/2008  T:  06/20/2008  Job:  [id number]   cc:   Angus G. Renard Matter, MD

## 2011-02-18 NOTE — Consult Note (Signed)
NAME:  [name], [name]               ACCOUNT NO.:  [id number]   MEDICAL RECORD NO.:  [id number]          PATIENT TYPE:  AMB   LOCATION:  DAY                           FACILITY:  [facility]   PHYSICIAN:  R. Roetta Sessions, M.D. DATE OF BIRTH:  1976-05-30   DATE OF CONSULTATION:  08/13/2007  DATE OF DISCHARGE:                                 CONSULTATION   CHIEF COMPLAINT:  Blood in the stools, change in bowel movement,  abdominal pain.   HISTORY OF PRESENT ILLNESS:  The patient is a 35 year old African-  American female who presents for further evaluation of the above states  symptoms. She is referred by Dr. Mirna Mires. She states that she has a  history of chronic constipation. However, the past couple of months, she  has started having change in her bowel movement, having more frequent  stools, up to 3 and 4 times a day. In the past, she describes herself as  laxative dependent and chronically used laxatives in order to have a  bowel movement. Now, she is having 3 and 4 stools a day. Sometimes she  wakes up in the middle of the night to have a stool. She has noted  bright red blood as well as darker stools but really denies any melena.  A couple of weeks ago, she went to the bathroom. She felt like she had  to have diarrhea and she had a large amount of fresh looking blood on  the tissue when she wiped. She also complains of lower abdominal pain.  She complains of her stools having a very foul smell. She complains of  excessive flatulence. She denies any weight loss, recent antibiotic use,  or ill contacts. She saw Dr. Loleta Chance for these symptoms recently and was  Hemoccult positive. Otherwise, rectal examination was unremarkable.   CURRENT MEDICATIONS:  Lopressor, Lipitor, Diamox.   ALLERGIES:  NO KNOWN DRUG ALLERGIES.   PAST MEDICAL HISTORY:  History of chronic constipation with recent  change in bowel movements, hypertension, and hypercholesterolemia.   PAST SURGICAL HISTORY:  Partial  hysterectomy for excessive bleeding  several years ago. She had some sort of vaginal surgery as a child and  she also had what appears to be shunting for hydrocephalus in the past.   FAMILY HISTORY:  Multiple second and third degree relatives had various  cancers but she is not aware of any colorectal cancer. Her mother had  diabetes mellitus. Her father was killed in a car accident at age 60.   SOCIAL HISTORY:  She is married. She has 3 children. Her youngest is 69  years old. 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 She is a non-smoker. She consumes alcoholic  beverages only on holidays.   REVIEW OF SYSTEMS:  See HPI for GI. CONSTITUTIONAL:  No unintentional  weight loss. CARDIOPULMONARY:  No chest pain or shortness of breath.  GENITOURINARY:  No dysuria or hematuria.   HABITS:  VITAL SIGNS:  Weight 244. Height 5 foot 9. Temperature 98.4,  blood pressure 128/70, pulse 80.  GENERAL:  Pleasant, obese, African-American  female in no acute distress.  SKIN:  Warm and dry. No jaundice.  HEENT:  Sclerae non-icteric. Oral pharyngeal mucosa moist and pink. No  lesions, erythema, or exudate.  NECK:  No lymphadenopathy or thyromegaly.  CHEST: Lungs clear to auscultation.  CARDIOVASCULAR:  Regular rate and rhythm. Normal S1 and S2. No murmur,  rub, or gallop.  ABDOMEN:  Positive bowel sounds. Obese, but symmetrical. Diffusely  tender in the lower abdomen, especially on the right hand side. No  rebound tenderness or guarding. No focal tenderness. No organomegaly,  masses, abdominal hernias, or bruits.  EXTREMITIES: Lower extremities, no edema.   LABORATORY DATA:  From August 03, 2007 revealed a normal CBC.   IMPRESSION:  The patient is a 35 year old African-American female with a  history of chronic constipation, requiring laxatives, who is now having  3 and 4 loose, sometimes bloody, stools for the past 1 to 2 months. She  also complains of lower  abdominal discomfort. She complains of nocturnal  diarrhea. I have offered her a colonoscopy to further evaluate change in  bowel movement and hematochezia. We discussed risks, alternatives, and  benefits with regards to risk of reaction to medication, bleeding,  infection, and perforation and she is agreeable to proceed. Differential  diagnoses at this point incudes IBD, colitis secondary to other etiology  such as infectious, colorectal cancer, less likely IBS with benign  anorectal bleeding.   PLAN:  Colonoscopy in the near future with Dr. Jena Gauss.   I would like to thank Dr. Mirna Mires for allowing [place] to take part in  the care of this patient.      Tana Coast, P.AJonathon Bellows, M.D.  Electronically Signed    LL/MEDQ  D:  08/13/2007  T:  08/14/2007  Job:  [id number]   cc:   Annia Friendly. Loleta Chance, MD  Fax: [phone]

## 2011-02-18 NOTE — Assessment & Plan Note (Signed)
NAMEMarland Kitchen  [name], [name]                CHART#:  [id number]   DATE:  08/10/2008                       DOB:  May 09, 1976   CHIEF COMPLAINT:  Abdominal pain, constipation, and nausea.   SUBJECTIVE:  The patient is here for a followup visit.  She was recently  discharged from a prolonged hospitalization.  She was admitted for  approximately 3 weeks.  She was initially admitted for bronchopneumonia  of the right upper lobe.  We were involved in her care because of  ongoing atypical chest pain, nausea, and constipation.  She has a  history of ovarian vein thrombosis and has been on Coumadin since  August.  At the time of her admission, she had a CT, which revealed a  markedly distended stomach.  Gastric outlet obstruction was not  excluded.  She also had chronic right ovarian vein thrombosis.  She had  a right upper lobe early pneumonia.  During her hospitalization, she had  a gastric emptying study, which was normal.  She had a head CT, which  was normal.  A HIDA scan revealed a gallbladder ejection fraction of 9%.  She did ultimately have a laparoscopic cholecystectomy.  She states it  did not help any of her symptoms.  She also was seen by Dr. Gerilyn Pilgrim and  had a LP done with her history of pseudotumor cerebri.  This was done  because of headaches and concern for elevated pressures.  She was having  blurred vision as well.  This all resolved after she had spinal fluid  removed.  She also had an EGD, which was normal.  A 48-hour Bravo pH  study on Protonix 40 mg b.i.d. showed abnormal, excessive GERD.  The  DeMeester score on day 1 was 11.2 and 17.4 on day 2.  She had a 139  reflux episodes during the study.  The longest one lasted to 11 minutes.  Time pH less than 4, was 76 minutes.  She also had relatively good  correlation with symptoms of regurgitation and chest pain during the  study.  At this point in time, she was switched from Protonix to Zegerid  40 mg b.i.d.  She presents back today  stating that she continues to have  daily heartburn and regurgitation.  She complains of 3 episodes of  nocturnal regurgitation where she feels like she is choking to death.  She has had lot of nausea, but no vomiting.  She takes her Amitiza  without food and this may be adding to her symptoms.  She complains of  lower abdominal pain, which has been chronic likely related to her  ovarian vein thrombosis and/or constipation.  She states she is not  having any significant bowel movements.  Stools are very hard.  No blood  in the stool or melena.  She is on MiraLax b.i.d. and Amitiza b.i.d.   Please note, the patient has failed Prilosec 20 mg daily, Nexium 40 mg  daily, Protonix 40 mg b.i.d., and Zegerid 40 mg b.i.d.   CURRENT MEDICATIONS:  See updated list.   ALLERGIES:  No known drug allergies.   PHYSICAL EXAMINATION:  VITAL SIGNS:  Weight unobtainable.  Temp 98.0,  blood pressure 138/90, and pulse 80.  GENERAL:  A pleasant, obese, black female in no acute distress.  She is  lying on the bed  when I walk in.  She has positive affect and in no  acute distress.  SKIN:  Warm and dry, and no jaundice.  HEENT:  Sclerae nonicteric.  Oropharyngeal mucosa moist and pink.  CHEST:  Lungs are clear to auscultation.  CARDIOVASCULAR:  Regular rate and rhythm.  ABDOMEN:  Obese.  Positive bowel sounds.  Soft.  She has minimal  tenderness in the lower abdomen.  She has 4 laparoscopic incisions,  which are dressed with Steri-Strips.  LOWER EXTREMITIES:  No edema. Left lower extremities in a boot from  recent Achilles tendon repair.   IMPRESSION:  The patient is a 35 year old lady who has significant  gastroesophageal reflux disease documented on Protonix 40 mg b.i.d.  She  has had no clinical response to Zegerid 40 mg b.i.d.  She actively is  regurgitating during the day as well as nocturnally.  I have had a long  discussion with her today approximately 30 minutes about possibility of  antireflux  surgery, pros and cons, the risk of dysphagia and gas bloat  if the wrap is too tight or the risk of it being unsuccessful.  She is  really not sure at this point if she wants to pursue surgery although  she previously had been fairly adamant about having it done.  She also  has issues with chronic constipation, which is not controlled at this  time.   PLAN:  1. Stop Zegerid, try Kapidex 60 mg daily to b.i.d., #20 samples.  2. Magnesium citrate 10 ounces today and then she will continue      MiraLax and Amitiza as before.  3. We will discuss further with Dr. Jena Gauss, if she may need referral      for consideration of antireflux surgery.       [name], P.A.  Electronically Signed     [name], M.D.  Electronically Signed    LL/MEDQ  D:  08/10/2008  T:  08/10/2008  Job:  [id number]   cc:   Angus G. Renard Matter, MD

## 2011-02-18 NOTE — Group Therapy Note (Signed)
NAMESCOTTI, MOTTER               ACCOUNT NO.:  [id number]   MEDICAL RECORD NO.:  [id number]          PATIENT TYPE:  INP   LOCATION:  A317                          FACILITY:  [facility]   PHYSICIAN:  Angus G. Renard Matter, MD   DATE OF BIRTH:  [birth year]   DATE OF PROCEDURE:  DATE OF DISCHARGE:                                 PROGRESS NOTE   This patient is recovering from laparoscopic cholecystectomy.  She is 2  days postop, is somewhat improved.  She continues to complain of reflux  symptoms, stating she is unable to swallow.  Still requiring IV pain  meds with Dilaudid.   OBJECTIVE:  VITAL SIGNS:  Blood pressure 134/84, respirations 20, pulse  83, temp 97.9.  LUNGS:  Clear to P&A.  HEART:  Regular rhythm.  ABDOMEN:  The patient has tender around incisions in the upper abdomen.   ASSESSMENT:  The patient was taken to surgery 2 days ago for  laparoscopic cholecystectomy, for biliary dysplasia, and decreased  ejection fraction of gallbladder.  She does have a history of  pseudotumor cerebri, gastroesophageal reflux disease, history of ovarian  vein thrombosis on right and ruptured Achilles tendon on the left which  has improved.   PLAN:  To continue current medicine.  We will attempt gradual  ambulation, advancement of diet, and continue pain control.      Angus G. Renard Matter, MD  Electronically Signed     AGM/MEDQ  D:  08/04/2008  T:  08/04/2008  Job:  [id number]

## 2011-02-18 NOTE — H&P (Signed)
NAMEMCKENA, CHERN               ACCOUNT NO.:  [id number]   MEDICAL RECORD NO.:  [id number]          PATIENT TYPE:  INP   LOCATION:  A317                          FACILITY:  [facility]   PHYSICIAN:  Angus G. Renard Matter, MD   DATE OF BIRTH:  Dec 10, 1975   DATE OF ADMISSION:  07/17/2008  DATE OF DISCHARGE:  LH                              HISTORY & PHYSICAL   A 35 year old Afro-American female who was seen in the office today with  the complaint of chest pain, low-grade fever, slight cough.  This  patient has had multiple problems over the past 2 months.  She initially  was evaluated for lower abdominal pain and found to have a thrombus in  her right ovarian vein.  This was diagnosed in August.  She was placed  on Lovenox subcutaneously to begin with and then was anticoagulated with  Coumadin.  Subsequent to this while at an athletic event, she jumped up  from her seat and ruptured the left Achilles tendon.  She had been  troubled with this since and finally had it repaired on September 30, by  a physician in [place] at [facility], Dr. Gerrit Heck.  During this period of time, she had to be taken off of her  anticoagulation and then subsequently was re-anticoagulated.  The  patient throughout this period of time has had gastrointestinal symptoms  of early satiety and fullness in upper abdomen, heartburn.  She had been  seen by Gastroenterology [facility] and given trial of Amitiza 24 mcg  daily, which was increased to twice a day and MiraLax.  She apparently  did have EGD by Dr. Jena Gauss and was thought she had mild duodenitis.  The  patient has continued to have some intermittent chest pain, which she  described as being in anterior chest.  Subsequent CT of the abdomen  reveal markedly distended stomach with a possible gastric outlet  obstruction.  No mass or inflammatory process.  CT of the chest showed  new mild infiltrate right upper lobe consistent with early pneumonia.  Chronic  right ovarian vein thrombosis, which is thought to be stable.  No acute findings in pelvis.  Mild diffuse distal colonic wall  thickening with consideration of inflammatory bowel disease or  infectious colitis.  With all the patient's symptoms and problems, it  was felt she should be hospitalized for further evaluation and pain  control.   SOCIAL HISTORY:  The patient is married, has 3 children.  Does not smoke  or consume much alcohol.   FAMILY HISTORY:  Noncontributory.  Mother has diabetes.   PAST MEDICAL AND SURGICAL HISTORY:  The patient has a history of partial  hysterectomy, vaginal surgery as a child, has had surgery for  hydrocephalous in the past, has a history of pseudotumor cerebri.  Also  a torn left Achilles tendon, ovarian vein thrombosis.   No known allergies.   REVIEW OF SYSTEMS:  HEENT:  Negative.  CARDIOPULMONARY:  The patient has had intermittent episodes of anterior  chest pain and shortness of breath, slight cough.  GI:  No bowel irregularity.  Occasional  constipation.  Episodes of upper  abdominal pain, intermittent and occasional lower abdominal pain.  GU:  No dysuria or hematuria.   PHYSICAL EXAMINATION:  GENERAL:  Uncomfortable Afro-American female.  VITAL SIGNS:  Blood pressure 120/80, pulse 60, respirations 18, temp 98.  HEENT:  Eyes:  PERRLA.  TMs negative.  Oropharynx benign.  NECK:  Supple.  No JVD or thyroid abnormalities.  LUNGS:  Clear to P and A.  HEART:  Regular rhythm.  No murmurs.  No cardiomegaly.  ABDOMEN:  Slightly obese.  No palpable organs or masses, tenderness in  mid abdomen.  No organomegaly.  EXTREMITIES:  Free of edema.  The patient has a cast on left lower leg.   DIAGNOSES:  1. Bronchopneumonia, right upper lobe.  2. Chest pain, undetermined etiology possibly related to pneumonitis.  3. Recent tear of left Achilles tendon status post surgery.  4. History of ovarian vein thrombosis, which occurred in August.  5. 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  6. Possible infectious colitis.  7. Gastroesophageal reflux disease.  8. Gastroparesis.   PLAN:  To obtain cultures of blood and start appropriate antibiotic if  needs be.  Continue anticoagulation.  We will provide pain control.  Obtain GI and possible cardiology consult.      Angus G. Renard Matter, MD  Electronically Signed     AGM/MEDQ  D:  07/17/2008  T:  07/18/2008  Job:  [id number]

## 2011-02-18 NOTE — Group Therapy Note (Signed)
[name], [name]               ACCOUNT NO.:  [id number]   MEDICAL RECORD NO.:  [id number]          PATIENT TYPE:  INP   LOCATION:  A317                          FACILITY:  [facility]   PHYSICIAN:  Angus G. Renard Matter, MD   DATE OF BIRTH:  1975/10/22   DATE OF PROCEDURE:  DATE OF DISCHARGE:                                 PROGRESS NOTE   This patient had laparoscopic cholecystectomy yesterday with removal of  gallbladder.  She had had biliary dysplasia and with decreased ejection  fraction of gallbladder and abdominal pain.  The patient appears more  comfortable.   OBJECTIVE:  VITAL SIGNS:  Blood pressure 129/81, respiration 18, pulse  67, and temperature 97.5.  LUNGS:  Clear to P&A.  HEART:  Regular rhythm.  ABDOMEN:  Tenderness around surgical incisions.   ASSESSMENT:  The patient is a day one following laparoscopic  cholecystectomy for biliary dysplasia and decreased ejection fraction of  gallbladder.  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   PLAN:  To continue her current medication.  We will attempt gradual  ambulation.      Angus G. Renard Matter, MD  Electronically Signed     AGM/MEDQ  D:  08/03/2008  T:  08/03/2008  Job:  [id number]

## 2011-02-18 NOTE — Group Therapy Note (Signed)
[name], [name]               ACCOUNT NO.:  [id number]   MEDICAL RECORD NO.:  [id number]          PATIENT TYPE:  INP   LOCATION:  A317                          FACILITY:  [facility]   PHYSICIAN:  Angus G. Renard Matter, MD   DATE OF BIRTH:  26-Jan-1976   DATE OF PROCEDURE:  DATE OF DISCHARGE:                                 PROGRESS NOTE   This patient was seen by neurologist and it was felt that she likely had  recurrent pseudotumor cerebri.  He has recommended spinal tap and  beginning Diamox.  The patient was evaluated by Gastroenterology and  felt to have normal esophagus and stomach.  Diet has been advanced.  She  is to continue Protonix.  MRI of head has been recommended.   OBJECTIVE:  VITAL SIGNS:  Blood pressure 110/64, respirations 20, pulse  77, temp 98.5, and O2 sats range from 96 to 97.  LUNGS:  Clear to P & A.  HEART:  Regular rhythm.  ABDOMEN:  No palpable organs or masses.  EXTREMITIES:  The patient has healing incision over the left Achilles  tendon.   ASSESSMENT:  The patient is admitted with above-stated problems.   PLAN:  To continue her current regimen assuming that Dr. Gerilyn Pilgrim will  consider lumbar puncture as noted.      Angus G. Renard Matter, MD  Electronically Signed     AGM/MEDQ  D:  07/26/2008  T:  07/26/2008  Job:  [id number]

## 2011-02-18 NOTE — Op Note (Signed)
NAMERELDA, AGOSTO NO.:  [id number]   MEDICAL RECORD NO.:  [id number]          PATIENT TYPE:  INP   LOCATION:  A317                          FACILITY:  [facility]   PHYSICIAN:  Tilford Pillar, MD      DATE OF BIRTH:  08-Sep-1976   DATE OF PROCEDURE:  08/02/2008  DATE OF DISCHARGE:                               OPERATIVE REPORT   PREOPERATIVE DIAGNOSIS:  Biliary dyskinesia.   POSTOPERATIVE DIAGNOSIS:  Biliary dyskinesia.   PROCEDURE:  Laparoscopic cholecystectomy.   SURGEON:  Tilford Pillar, MD   ANESTHESIA:  General endotracheal, local anesthetic 0.5% Sensorcaine  plain.   SPECIMEN:  Gallbladder with aberrant liver tissue.   ESTIMATED BLOOD LOSS:  Minimal.   INDICATION:  The patient is a 35 year old female who had presented to  [facility] with a course of diffuse abdominal pain.  She was  actually admitted on July 17, 2008 and had undergone an extensive  workup for her abdominal pain involving both upper and lower  endoscopies.  During her workup, she has also had a HIDA scan, which  demonstrated a 9% ejection fraction and did exacerbate some of her right  upper quadrant and epigastric abdominal symptoms.  All of her  symptomatology may not necessarily be contributed to a biliary etiology,  but she does have symptoms that are consistent with gallbladder  etiology.  The risks, benefits, and alternatives of a laparoscopic  possible open cholecystectomy were discussed at length including, but  not limited to risk of bleeding, infection, bile leak, and common bile  duct injury as well as the possibility of intraoperative cardiac or  pulmonary events.  The patient's questions and concerns were addressed.  The patient consented for a planned procedure.   OPERATION:  The patient was taken to the operating room and was placed  in supine position on the operating table, at which time general  anesthetic was administered.  Once the patient was asleep, she  was  endotracheally intubated by Anesthesia.  At this point, her abdomen was  prepped with DuraPrep solution and dressed in standard fashion.  A stab  incision was created supraumbilically with an 11-blade scalpel.  Digital  dissection down to the subcuticular tissue was carried out using a  Kocher clamp, which was utilized to grasp the anterior abdominal wall  fascia anteriorly.  A Veress needle was inserted.  Once sufficient  pneumoperitoneum was obtained, an 11-mm trocar was inserted over a  laparoscope allowing visualization of the trocar entering into the  peritoneal cavity.  At this time, the inner cannula was removed.  The  laparoscope was reinserted.  There was no evidence of any trocar or  Veress needle placement injury.  At this time, the remaining trocars  were placed with an 11-mm trocar in the epigastrium, a 5-mm trocar in  the midline between the two 11-mm trocars and a 5-mm trocar in the right  lateral abdominal wall.  At this point, the patient was placed in a  reverse Trendelenburg left lateral decubitus position.  The fundus of  the gallbladder was grasped and lifted up  and over the right lobe of the  liver.  At this point, the peritoneal reflection on the infundibulum of  the gallbladder was dissected free using a Art gallery manager.  This  exposed the cystic duct entering into the infundibulum.  A window was  created behind the cystic duct.  Three EndoClips were placed proximally  and 1 distally and the cystic duct was divided between 2 most distal  clips.  Similarly, the cystic artery was identified.  A window was  created behind the cystic artery.  Two EndoClips were placed proximally  and 1 distally and the cystic artery was divided between the 2 most  distal clips.  At this point, electrocautery was utilized to dissect the  gallbladder free from the gallbladder fossa.  It was noted during  initial evaluation of the gallbladder that there was a small aberrant   hepatic growth on the gallbladder serosa of the body of the gallbladder.  This was clearly detached from any portion of the true hepatic  parenchyma and capsule and appeared to be isolated and encapsulated on  its own.  On appearances, it appeared to be normal liver tissue, it did  not look malignant in its appearance.  During the dissection, as the  gallbladder was removed, a small cholecystotomy was created, which did  allow some bile escape.  This was quickly aspirated and controlled.  The  gallbladder was freed.  It was placed in the EndoCatch bag, which was  placed up and over the right lobe of the liver.  Hemostasis was obtained  on the gallbladder fossa using electrocautery.  The clips were  evaluated.  They were noted to be in excellent position.  There was no  evidence of any hemorrhage from the cystic artery or any bile leak from  the cystic duct.  At this time, I turned my attention to closure.   Using an EndoClose suture passing device, a 2-0 Vicryl suture was passed  through both the 11-mm trocar sites and the sutures in place.  The  gallbladder was removed from the epigastric trocar site in an intact  EndoCatch bag.  It was placed on the back table and sent as a permanent  specimen to Pathology.  This also included the small portion of aberrant  finishing.  With the gallbladder removed in an intact EndoCatch bag, the  Vicryl sutures were secured after the pneumoperitoneum was evacuated.  Local anesthetic was instilled.  A 4-0 Monocryl was utilized to  reapproximate the skin edges at all 4 trocar sites in a running  subcuticular suture.  The skin was washed and dried with a moistened dry  towel.  Benzoin was applied around the incision.  Half-inch Steri-Strips  were placed.  Drapes were removed.  The patient was allowed to come out  of general aesthetic, and she was transferred back to a regular hospital  bed and transferred to the Postanesthetic Care Unit in stable  condition.  At the conclusion of the procedure, all instrument, sponge, and needle  counts were correct.  The patient tolerated the procedure well.      Tilford Pillar, MD  Electronically Signed     BZ/MEDQ  D:  08/02/2008  T:  08/03/2008  Job:  [id number]   cc:   R. Roetta Sessions, M.D.  [address]  [place]  [place] [address]

## 2011-02-18 NOTE — Group Therapy Note (Signed)
NAMEBRAELIN, [name]               ACCOUNT NO.:  [id number]   MEDICAL RECORD NO.:  [id number]          PATIENT TYPE:  INP   LOCATION:  A317                          FACILITY:  [facility]   PHYSICIAN:  Kofi A. Gerilyn Pilgrim, M.D. DATE OF BIRTH:  Dec 21, 1975   DATE OF PROCEDURE:  DATE OF DISCHARGE:                                 PROGRESS NOTE   FOLLOW-UP  NOTE   Temperature 98.1, pulse 90, blood pressure 143/67.   Patient continues to have significant headaches and blurry vision.   Apparently, the MRI was cancelled by GI because of the procedure they  were going to do.  They have extended it until November, but I think we  will need to get the answers before then.  Will need to assess her  intracranial pressure, as she can develop permanent visual loss from  raised intracranial pressure due to pseudotumor cerebri.   We will discontinue the Coumadin, which was restarted.  Will also stop  the MRI and do a head CT scan, which should be sufficient.  We will go  ahead and hold the Lovenox for tonight and tomorrow morning and plan to  do the spinal tap on October 21 after head CT scan has been obtained.      Kofi A. Gerilyn Pilgrim, M.D.  Electronically Signed     KAD/MEDQ  D:  07/26/2008  T:  07/26/2008  Job:  [id number]

## 2011-02-18 NOTE — Group Therapy Note (Signed)
NAMEVANDY, FONG               ACCOUNT NO.:  [id number]   MEDICAL RECORD NO.:  [id number]           PATIENT TYPE:  INP   LOCATION:  A317                          FACILITY:  [facility]   PHYSICIAN:  Angus G. Renard Matter, MD   DATE OF BIRTH:  [birth year]   DATE OF PROCEDURE:  DATE OF DISCHARGE:                                 PROGRESS NOTE   This patient continues to have upper abdominal pain, reflux symptoms,  and chest pain.  She does have recurrent pseudotumor cerebri.  It is  being treated by Diamox and is being treated by Gastroenterology Service  who ordered HIDA scan to evaluate for potential gallbladder disease.   OBJECTIVE:  VITAL SIGNS:  Blood pressure 102/54, respirations 16, pulse  75, and temperature 98.1.  LUNGS:  Clear to P&A.  HEART:  Regular rhythm.  ABDOMEN:  No palpable organs or masses.  Slight tenderness in upper  abdomen.  Hypoactive bowel sounds.   ASSESSMENT:  The patient was admitted with above-stated problems and  proceed with HIDA scan on Monday.  Continue same regimen otherwise.      Angus G. Renard Matter, MD  Electronically Signed     AGM/MEDQ  D:  07/30/2008  T:  07/30/2008  Job:  [id number]

## 2011-02-18 NOTE — Group Therapy Note (Signed)
[name], [name]               ACCOUNT NO.:  [id number]   MEDICAL RECORD NO.:  [id number]          PATIENT TYPE:  INP   LOCATION:  A317                          FACILITY:  [facility]   PHYSICIAN:  Angus G. Renard Matter, MD   DATE OF BIRTH:  1976-06-16   DATE OF PROCEDURE:  DATE OF DISCHARGE:                                 PROGRESS NOTE   This patient continues to have upper abdominal pain and reflux symptoms.  She does have recurrent pseudotumor cerebri.  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   OBJECTIVE:  VITAL SIGNS:  Blood pressure 100/58, respirations 20, pulse  90, and temperature 98.5.  LUNGS:  Clear to P&A.  HEART:  Regular rhythm.  ABDOMEN:  Slight tenderness in upper abdomen.   LABORATORY DATA:  WBC 8700 with hemoglobin of 11.2 and hematocrit of  32.4.  INR 1.1.   ASSESSMENT:  The patient does have severe reflux.  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   PLAN:  Proceed with plans for cholecystectomy.      Angus G. Renard Matter, MD  Electronically Signed     AGM/MEDQ  D:  08/01/2008  T:  08/01/2008  Job:  [id number]

## 2011-02-18 NOTE — Group Therapy Note (Signed)
[name], [name]               ACCOUNT NO.:  [id number]   MEDICAL RECORD NO.:  [id number]          PATIENT TYPE:  INP   LOCATION:  A317                          FACILITY:  [facility]   PHYSICIAN:  Kofi A. Gerilyn Pilgrim, M.D. DATE OF BIRTH:  10-02-1976   DATE OF PROCEDURE:  DATE OF DISCHARGE:                                 PROGRESS NOTE   The patient is status post lumbar puncture.  She had an elevated  pressure with 30 cm of water with a closing pressure of 18.  The patient  reports resolution of headaches and blurry vision, but now has  complaints of chest pain and abdominal pain.  She had these on  presentation, but it appears that they have gone worse.  Today, her  Lovenox was held for 24 hours and has been restarted on last night and  this morning.   Today, temperature 98.2, pulse 77, and blood pressure 105/54.  The  patient is awake and alert.  She converses well.  Obviously, she is  having some discomfort from chest.  Today, she has been evaluated by GI  and Cardiology.   ASSESSMENT AND PLAN:  The spinal fluid analysis clearly shows evidence  of pseudotumor cerebri.  We will follow up the routine labs associated  with this.  Attempt to rule out this chest pain is currently being  worked up by GI and Cardiology.  The patient is on full-dose  anticoagulation, therefore I suspect unlikely that she has developed  another deep vein thrombosis that may have caused a pulmonary embolism,  although this certainly could be evaluated.      Kofi A. Gerilyn Pilgrim, M.D.  Electronically Signed     KAD/MEDQ  D:  07/27/2008  T:  07/27/2008  Job:  [id number]

## 2011-02-18 NOTE — Group Therapy Note (Signed)
NAMERYHANNA, DUNSMORE               ACCOUNT NO.:  [id number]   MEDICAL RECORD NO.:  [id number]          PATIENT TYPE:  INP   LOCATION:  A317                          FACILITY:  [facility]   PHYSICIAN:  Angus G. Renard Matter, MD   DATE OF BIRTH:  [birth year]   DATE OF PROCEDURE:  07/18/2008  DATE OF DISCHARGE:                                 PROGRESS NOTE   SUBJECTIVE:  This patient has a history of right ovarian vein  thrombosis, also history of left Achilles tendon rupture and repair.  Does have a history of mild duodenitis, was admitted on this occasion  with continued chest pain and shortness of breath.  She does have what  appears to be bronchopneumonia, right upper lobe.  Does have a markedly  distended stomach on admission.  Gastric outlet obstruction could not be  ruled out.  The patient was started on IV antibiotics.   OBJECTIVE:  VITAL SIGNS:  Blood pressure 120/80, respirations 20, pulse  60, and temperature 97.9.  GENERAL:  __________ slightly low potassium of 3.1  LUNGS:  Clear to P&A.  HEART:  Regular rhythm.  ABDOMEN:  No palpable organs or masses.   ASSESSMENT:  The patient admitted with above-stated problems.  She does  have slightly low serum potassium, which will be repleted.  Obtain  gastroenterologist consults and Cardiology consult.      Angus G. Renard Matter, MD  Electronically Signed     AGM/MEDQ  D:  07/18/2008  T:  07/18/2008  Job:  [id number]

## 2011-02-18 NOTE — Op Note (Signed)
NAME:  [name], [name]               ACCOUNT NO.:  [id number]   MEDICAL RECORD NO.:  [id number]          PATIENT TYPE:  AMB   LOCATION:  DAY                           FACILITY:  [facility]   PHYSICIAN:  R. Roetta Sessions, M.D. DATE OF BIRTH:  Oct 13, 1975   DATE OF PROCEDURE:  05/22/2008  DATE OF DISCHARGE:                               OPERATIVE REPORT   PROCEDURE:  Esophagogastroduodenoscopy, diagnostic.   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  Her INR was 1.2, last week,  she stopped her Coumadin.  She stopped Lovenox 3 days ago.  She was  significantly constipated.  She had been on MiraLax.  She was started on  Amitiza recently through our office and was associated with marked  improvement in constipation symptoms, but her lower abdominal pain  really has not improved to any significant degree.  Labs through our  office, amylase and lipase normal at 57 and 31 respectively.  LFTs are  okay except for slightly elevated SGPT 36 (1 point above normal).  CBC  entirely normal.  EGD is now being done.  This approach has been  discussed with the patient at length.  Risks, benefits, and alternatives  have been reviewed and questions answered.  Please see documentation in  medical record.   PROCEDURE NOTE:  O2 saturation, blood pressure, pulse, and respirations  were monitored throughout the entire procedure.   CONSCIOUS SEDATION:  Versed 6 mg IV and Demerol 125 mg IV in divided  doses.  Cetacaine spray for topical pharyngeal anesthesia.   INSTRUMENT:  Pentax video chip system.   FINDINGS:  Examination of the tubular esophagus revealed no mucosal  abnormalities.  Esophageal mucosa appeared entirely normal and the  tubular esophagus was widely patent through the EG junction.   Stomach:  Gastric cavity was emptied and insufflated well with air.  Thorough  examination of the gastric mucosa including retroflexed view of  the proximal stomach, esophagogastric junction demonstrated no  abnormalities.  Pylorus was patent and easily traversed.  Examination of  the bulb and second portion revealed no abnormalities.  The scope was  pulled back into the stomach and gastric mucosa was once again seen and  the scope was reintroduced into the duodenum and normal findings were  once again confirmed.   THERAPEUTIC/DIAGNOSTIC MANEUVERS PERFORMED:  None.   The patient tolerated the procedure well and was reacted in Endoscopy.   IMPRESSION:  Normal esophagus, stomach, D1 and D2.  No endoscopic  explanation for the patient's symptoms.  Right ovarian vein thrombosis  may be causing the majority of the patient's abdominal pain as it may  not have yet resorbed.  Constipation has definitely improved with  Amitiza.   RECOMMENDATIONS:  1. Resume Coumadin and Lovenox today.  Continue  with Lovenox until      INR therapeutic.  We will arrange for her to have an INR in 5 days      and followup with Dr. Renard Matter.  Dr. Renard Matter will determine  timing      of Lovenox cessation.  2. Continue Amitiza 24 mcg twice daily with meals.  Use MiraLax in      addition as needed.  3. We will arrange for [name] to come back to see [place] in the office      in 1 month and we will check hepatic profile just prior to her      office visit.      Jonathon Bellows, M.D.  Electronically Signed     RMR/MEDQ  D:  05/22/2008  T:  05/23/2008  Job:  [id number]   cc:   Angus G. Renard Matter, MD  Fax: [phone]

## 2011-02-18 NOTE — Op Note (Signed)
NAME:  [name], [name]               ACCOUNT NO.:  [id number]   MEDICAL RECORD NO.:  [id number]          PATIENT TYPE:  AMB   LOCATION:  DAY                           FACILITY:  [facility]   PHYSICIAN:  R. Roetta Sessions, M.D. DATE OF BIRTH:  1976/01/31   DATE OF PROCEDURE:  08/20/2007  DATE OF DISCHARGE:                               OPERATIVE REPORT   PROCEDURE:  Diagnostic colonoscopy.   INDICATIONS FOR PROCEDURE:  A 35 year old lady with marked changes in  bowel habits, recently going from constipation to a tendency towards  diarrhea, some nocturnal diarrhea and some small volume hematochezia.  Colonoscopy is now being done.  This approach has been discussed with  the patient at length.  Potential risks, benefits and alternatives have  been reviewed and questions answered.  She is agreeable.  Please note  that from August 03, 2007, her CBC was completely normal with a white  count of 6.8, H&H was 13.7 and 40.5, platelet count 390,000.  Please see  documentation on the medical record.   PROCEDURE NOTE:  O2 saturation, blood pressure, pulses, and respirations  were monitored throughout the entire procedure.  Conscious sedation with  IV Demerol 75 mg IV and Versed 5 mg IV in divided doses.   INSTRUMENT:  Pentax video chip system.   FINDINGS:  Digital rectal exam revealed no abnormalities.   ENDOSCOPIC FINDINGS:  Prep was excellent except for in the ascending  segment, there was a thin coating of yellowish stool, which had to be  dealt with.   COLON:  The colonic mucosa was surveyed from the rectosigmoid junction  through the left transverse, right colon, to the appendiceal orifice,  the ileocecal valve, and cecum.  These structures were well seen and  photographed for the record.  The terminal ileum was intubated to 20 cm.  From this level, the scope was slowly withdrawn and all previously  mentioned mucosal surfaces were again seen.  There was a thin coating of  yellowish,  mucus-like stool on the cecum and ascending colon.  It was  easily washed away.  Careful inspection of the colonic mucosa revealed  no mucosal abnormalities.  The terminal ileum mucosa also appeared  normal.  The scope was pulled down to the rectum, where a thorough  examination of the rectal mucosa, including retroflexion of the anal  verge, __________  of the anal canal demonstrated some friable anal  canal hemorrhage, otherwise the rectal mucosa appeared entirely normal.  The patient tolerated the procedure well and was reactive.   ENDOSCOPY IMPRESSION:  Friable anal canal, otherwise normal rectum,  colon, and terminal ileum.   I would be somewhat concerned, as this lady did acquire a bacterial  infection recently and may or may not still have a festering component.  There is no evidence of inflammatory bowel disease on today's  examination.   A fresh stool sample has been submitted to the lab.   RECOMMENDATIONS:  1. Will follow up on stool studies from today.  2. A 10-day course of Anusol-HC suppositories, 1 per rectum at      bedtime.  3. Will consider further intervention pending results of stool      studies.  4. I have recommended that she use Imodium p.r.n.      Jonathon Bellows, M.D.  Electronically Signed     RMR/MEDQ  D:  08/20/2007  T:  08/21/2007  Job:  [id number]   cc:   Annia Friendly. Loleta Chance, MD  Fax: [phone]

## 2011-02-18 NOTE — Group Therapy Note (Signed)
[name], [name]               ACCOUNT NO.:  [id number]   MEDICAL RECORD NO.:  [id number]           PATIENT TYPE:  INP   LOCATION:  A317                          FACILITY:  [facility]   PHYSICIAN:  Angus G. Renard Matter, MD   DATE OF BIRTH:  [birth year]   DATE OF PROCEDURE:  DATE OF DISCHARGE:                                 PROGRESS NOTE   This patient has multiple medical problems.  She does have a history of  right ovarian vein thrombosis, history of left Achilles tendon rupture  and repair, does have known duodenitis, had early pneumonia right upper  lobe which has improved, a distended stomach on admission, possible  gastric outlet obstruction, is being followed by GI and Cardiology  service.  A 2-D echo showed no significant abnormalities.  She continues  to have episodes of nausea and chest pain.   OBJECTIVE:  VITAL SIGNS:  Blood pressure 114/62, respiration 20, pulse  79, temperature 97.9, and O2 sats 97%.  LUNGS:  Clear to P&A.  HEART:  Regular rhythm.  ABDOMEN:  Slightly distended.   Chemistries remain normal.  CBC normal.   ASSESSMENT:  The patient was admitted with above-stated problems.  She  does have possible gastric outlet obstruction with distended stomach.  Her pneumonia has improved and all other problems remains stable.  She  is to have gastric outlet study tomorrow by Gastroenterology Service.      Angus G. Renard Matter, MD  Electronically Signed     AGM/MEDQ  D:  07/20/2008  T:  07/20/2008  Job:  [id number]

## 2011-02-18 NOTE — Consult Note (Signed)
[name], [name] NO.:  [id number]   MEDICAL RECORD NO.:  [id number]          PATIENT TYPE:  INP   LOCATION:  A317                          FACILITY:  [facility]   PHYSICIAN:  Gerrit Friends. Dietrich Pates, MD, FACCDATE OF BIRTH:  May 31, 1976   DATE OF CONSULTATION:  07/18/2008  DATE OF DISCHARGE:                                 CONSULTATION   REFERRING PHYSICIAN:  Angus G. McInnis, MD   HISTORY OF PRESENT ILLNESS:  A 35 year old woman without known  cardiovascular disease and with no important cardiovascular risk factors  other than the positive family history and a questionable history of  hyperlipidemia and hypertension.  Referred for evaluation of chest  discomfort.  [name] has had chronic chest discomfort with evaluation  at [facility] approximately 2 years ago including negative stress  test by her report and a number of visits to the emergency department in  2006.  She describes near daily episodes of left anterior pressure of  mild-to-moderate intensity with some sharp components and some related  discomfort in the left shoulder.  The discomfort has a pleuritic  component, which prevents her from taking a deep breath.  She describes  dyspnea, but this is questionable.  She has intermittent sweats, but no  related diaphoresis nor nausea.  Symptoms last few minutes and resolved  spontaneously.  There is no relationship to exertion, body position, or  movement of left upper extremity or trunk.   [name] is now admitted for an infiltrate on chest x-ray.  She has not  had a clinical pneumonia, describing no fevers, cough nor sputum.  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  Attempts have  been made to anticoagulate her since then, but a therapeutic INR has  been difficult to achieve.  She has been treated with subcutaneous  enoxaparin in full dose.  She recently started  to run suddenly and  ruptured her left Achilles tendon, which required surgical repair.  She  had a recent upper endoscopy that showed some mucosal inflammation.  She  has had gastric distention on the CT scan, thought to possibly represent  gastric outlet obstruction.   Past medical history is otherwise notable for a history of  hydrocephalous and pseudotumor cerebri.  She had gestational  hypertension in her 73s.  She has had a herniated nucleus pulposus in  the lumbosacral spine.   FAMILY HISTORY:  Positive for coronary artery disease.   SOCIAL HISTORY:  Married with 3 children; no use of tobacco products; no  excessive use of alcohol; unemployed for the past year or 2.   CURRENT MEDICATIONS:  1. Lisinopril 10 mg daily.  2. Atorvastatin 20 mg daily.  3. Warfarin 5 mg daily.  4. Singulair 10 mg daily.  5. Xanax 0.5 mg p.r.n.  6. Percocet 10/650 mg p.r.n.   REVIEW OF SYSTEMS:  Notable for early satiety, increased flatulence, and  chronic constipation.  She notes occasional bright red blood per rectum  that she attributes to  hemorrhoids.  She experiences intermittent  headaches, which she characterizes as migraines.  She has episodic  dizziness.  She experiences chronic back pain.  She follows a regular  diet.  She occasionally experiences insomnia and takes Xanax.  All other  systems reviewed and are negative.   PHYSICAL EXAMINATION:  GENERAL:  Pleasant well-appearing woman.  VITAL SIGNS:  The weight is 112 kg, height 69 inches, O2 saturation 98%  on room air, temperature 98.7, heart rate 72 and regular, respirations  20, blood pressure 115/75.  HEENT:  EOMs full; normal lids and conjunctiva; normal oral mucosa.  NECK:  No jugular venous distention; normal carotid upstrokes without  bruits.  ENDOCRINE:  No thyromegaly.  HEMATOPOIETIC:  No adenopathy.  SKIN:  No significant lesions; multiple tattoos.  LUNGS:  Clear.  CARDIAC:  Split first heart sounds; normal second heart  sound; normal  PMI.  ABDOMEN:  Soft and nontender; no organomegaly; normal bowel sounds.  EXTREMITIES:  Surgical incision over the posterior lower left leg; no  edema; normal distal pulses.  NEUROLOGIC:  Symmetric strength and tone; normal cranial nerve.   EKG:  Sinus rhythm; left atrial abnormality; delayed R-wave progression.   Other laboratory notable for a normal CBC except for elevated platelet  count of 450,000, an INR of 1.6, potassium of 3.1, normal renal and  hepatic testing, normal cardiac markers.  CT scan of the chest reviewed;  a very minimal findings on the right upper lobe.   IMPRESSION:  [name] has a longstanding chest pain that is almost  certainly not of cardiac origin.  Her likelihood of having developed  atherosclerotic coronary disease at age 72 with probable continuing  ovarian function is remote.  She is apparently already undergone stress  testing.  A stress echocardiogram would be reasonable to perform as an  outpatient after current issues have been appropriately managed.  Her  chest symptoms are more likely related to gastrointestinal problems.  A  gastroesophageal reflux disease or esophageal spasm could account for  her symptoms.  Anxiety is another major diagnostic possibility.  For  now, I would recommend symptomatic treatment with a PPI and sublingual  nitroglycerin as needed.   She has been diagnosed with right ovarian thrombosis.  A  hypercoagulability profile will be obtained.  Her dose of warfarin  should be increased in an attempt to achieve therapeutic  anticoagulation, as long-standing treatment with Lovenox is not to in  her best interest.  If this fails, the drug should be administered under  direct observation.   She has nonspecific EKG abnormalities.  An echocardiogram will be  obtained to evaluate myocardial thickness and function.  The remainder  of her evaluation could be performed on an outpatient basis.  We  appreciate the request  for consultation and will be happy to follow this  nice woman with you.     Gerrit Friends. Dietrich Pates, MD, [facility]  Electronically Signed    RMR/MEDQ  D:  07/18/2008  T:  07/19/2008  Job:  [id number]

## 2011-02-18 NOTE — Op Note (Signed)
[name], [name]               ACCOUNT NO.:  [id number]   MEDICAL RECORD NO.:  [id number]          PATIENT TYPE:  INP   LOCATION:  A317                          FACILITY:  [facility]   PHYSICIAN:  R. Roetta Sessions, M.D. DATE OF BIRTH:  10-15-1975   DATE OF PROCEDURE:  07/24/2008  DATE OF DISCHARGE:                               OPERATIVE REPORT   __________ Bravo pH probe placement.   INDICATIONS FOR PROCEDURE:  A 35 year old lady with prolonged  hospitalization with multiple GI and non-GI complaints.  She has had  refractory nausea and reflux symptoms in spite of an aggressive medical  regimen.  Diagnostic EGD earlier this year was completely normal.  Gastric emptying study this admission demonstrated normal gastric  emptying.  In addition to reflux symptoms, she has a prominent  regurgitation and possible poorly-controlled reflux disease on b.i.d.  PPI therapy.  EGS now being done unless inflammatory changes were found.  She will have a Bravo probe placed.  An MRI of her head has been ordered  by Dr. Gerilyn Pilgrim.  However, on per protocol once Bravo was placed and we  will have to wait 30 days prior to any MRI studies.  [name] questions  were answered and she is agreeable with this approach.  Risks, benefits,  alternatives, and limitations have been reviewed.   PROCEDURE NOTE:  O2 saturation, blood pressure, pulse respirations were  monitored throughout the entire procedure.  Conscious sedation, Versed 4  mg, IV Demerol 75 mg IV in divided doses.  Phenergan 25 mg diluted slow  IV push prior to procedure to augment conscious sedation.   INSTRUMENT:  Pentax video chip system.   FINDINGS:  Examination of the tubular esophagus revealed entirely normal  esophageal mucosa of the EG junction was identified at 42 cm from the  incisors with the stomach both inflated and deflated.  EG junction was  easily traversed.  Gastric cavity inflated well with air.  Thorough  examination of the  gastric mucosa including retroflexed view of the  proximal stomach esophagogastric demonstrated no abnormalities.  Pylorus  widely patent, easily traversed.  Examination of the bulb and second  portion revealed no abnormalities.   THERAPEUTIC/DIAGNOSTIC MANEUVERS PERFORMED:  The gastric __________ was  removed from the patient.  The Bravo deployment catheter was advanced  blindly to 36 cm in the incising corresponding to a location of 6 cm  above the GE junction.  It was deployed in standard fashion, catheter  was removed.  A look back revealed the Bravo unit was successfully  anchored to the esophageal mucosa.   IMPRESSION:  Normal esophagus stomach D1, D2 stents were placement with  Bravo pH probe.   RECOMMENDATIONS:  1. Advance diet as tolerated.  2. Continue Protonix 40 mg orally twice daily.  3. May resume Coumadin today, may resume Lovenox tomorrow.  Again, no      MRI until after July 24, 2008, see orders.  Further      recommendations to follow.      Jonathon Bellows, M.D.  Electronically Signed     RMR/MEDQ  D:  07/24/2008  T:  07/25/2008  Job:  [id number]   cc:   Gerrit Friends. Dietrich Pates, MD, [facility]  [address]  [place], [place] [address]   Angus G. Renard Matter, MD  Fax: [phone]   Kofi A. Gerilyn Pilgrim, M.D.  Fax: [phone]

## 2011-02-18 NOTE — Group Therapy Note (Signed)
NAMECHANTALLE, [name]               ACCOUNT NO.:  [id number]   MEDICAL RECORD NO.:  [id number]          PATIENT TYPE:  INP   LOCATION:  A317                          FACILITY:  [facility]   PHYSICIAN:  Angus G. Renard Matter, MD   DATE OF BIRTH:  [birth year]   DATE OF PROCEDURE:  07/20/2008  DATE OF DISCHARGE:                                 PROGRESS NOTE   This patient has multiple medical problems.  She is scheduled for a  gastric emptying study today.  She does have a history of right ovarian  vein thrombosis and is chronically anticoagulated, history of left  Achilles tendon rupture and repair, does have known duodenitis, and  early pneumonia which has improved.  She does have a distended stomach  with possible gastric outlet obstruction, is being followed by GI and  Cardiology Service.  A 2-D echo did not show significant abnormalities.  She continues to have episodes of nausea.   OBJECTIVE:  VITAL SIGNS:  Blood pressure 119/70, respirations 18, pulse  82, and temp 98.4.  LUNGS:  Clear to P&A.  HEART:  Regular rhythm.  ABDOMEN:  No palpable organs or masses.   CBC within normal limits with exception of slightly low hemoglobin 10.4,  hematocrit 30.3, INR 1.1.   ASSESSMENT:  The patient was admitted with above stated problems.  She  is scheduled for gastric emptying study today and will be seen by  Neurology regarding prior history of pseudotumor cerebri.  Continue  current regimen.      Angus G. Renard Matter, MD  Electronically Signed     AGM/MEDQ  D:  07/21/2008  T:  07/21/2008  Job:  [id number]

## 2011-02-18 NOTE — Group Therapy Note (Signed)
[name], [name]               ACCOUNT NO.:  [id number]   MEDICAL RECORD NO.:  [id number]          PATIENT TYPE:  INP   LOCATION:  A317                          FACILITY:  [facility]   PHYSICIAN:  Angus G. Renard Matter, MD   DATE OF BIRTH:  1975-12-01   DATE OF PROCEDURE:  DATE OF DISCHARGE:                                 PROGRESS NOTE   This patient continues to have anterior chest pain and upper abdominal  pain with reflux symptoms.  She does have recurrent pseudotumor cerebri  and had been evaluated by Neurology and is being treated with Diamox.  Gastroenterology Service has planned HIDA scan to evaluate for potential  gallbladder disease today.   OBJECTIVE:  VITAL SIGNS:  Blood pressure 115/70, respirations 18, pulse  72, temp 98.2.  HEART:  Regular rhythm.  LUNGS:  Clear to P&A.  ABDOMEN:  No palpable organs or masses.   ASSESSMENT:  The patient continues to have upper abdominal pain and low  chest pain with episodes of nausea.   PLAN:  To continue current regimen.  We will obtain HIDA scan today to  evaluate more fully status of gallbladder.      Angus G. Renard Matter, MD  Electronically Signed     AGM/MEDQ  D:  07/31/2008  T:  07/31/2008  Job:  [id number]

## 2011-02-18 NOTE — Group Therapy Note (Signed)
[name], BARNA               ACCOUNT NO.:  [id number]   MEDICAL RECORD NO.:  [id number]          PATIENT TYPE:  INP   LOCATION:  A317                          FACILITY:  [facility]   PHYSICIAN:  Kofi A. Gerilyn Pilgrim, M.D. DATE OF BIRTH:  April 04, 1976   DATE OF PROCEDURE:  DATE OF DISCHARGE:                                 PROGRESS NOTE   The patient continues to report headaches and blurred vision.  She is  awake, alert, and converses well.  Head CT scan of the brain shows  nothing acute and essentially negative.  Her Lovenox has been held for  24 hours and Coumadin has also been held.   Temperature is 98.0, pulse 83, and blood pressure 122/59.   ASSESSMENT AND PLAN:  I assume it to be cerebri.  I think we will go  ahead and arrange for the patient to have a spinal tap.  We will arrange  for opening pressure and removing the fluid until the closing pressures  are less than 50.  The fluid is sent for routine studies.      Kofi A. Gerilyn Pilgrim, M.D.  Electronically Signed     KAD/MEDQ  D:  07/26/2008  T:  07/26/2008  Job:  [id number]

## 2011-02-18 NOTE — Consult Note (Signed)
NAMETANAYIA, WAHLQUIST               ACCOUNT NO.:  [id number]   MEDICAL RECORD NO.:  [id number]          PATIENT TYPE:  INP   LOCATION:  A317                          FACILITY:  [facility]   PHYSICIAN:  Kofi A. Gerilyn Pilgrim, M.D. DATE OF BIRTH:  [birth year]   DATE OF CONSULTATION:  DATE OF DISCHARGE:                                 CONSULTATION   REFERRING PHYSICIAN:  Angus G. McInnis, MD   REASON FOR CONSULTATION:  History of pseudotumor with recurrent  symptoms.   HISTORY OF PRESENT ILLNESS:  The patient is a 35 year old black female  who has had a baseline diagnosis with pseudotumor cerebri.  She was seen  and evaluated at [facility] by Dr. Alvester Morin approximately 8 years ago.  She had  significant headaches, blurred vision, and diplopia.  At that time, she  apparently underwent a diagnostic and therapeutic lumbar puncture and  she responded well.  She also saw an ophthalmologist.  She does endorse  that she was on Diamox, but has not taken this in many years.  She  apparently lost some weight and was asymptomatic until recently and is  regaining some weight again.  She reports that she is 245 pounds and  height 5 feet 9 inches.  The patient reports returning headaches and  some blurred vision.  She indicates that she was apparently a hard  spinal tap at that time and the procedure had been done under  fluoroscopic guidance.  She has been admitted for bronchopneumonia right  upper lobe with chest pain.  She has also been diagnosed with ovarian  venous thrombosis, which occurred in October.  She recently tore the  Achilles tendon on the left and underwent surgery and was somewhat  immobilized, but the venous thrombosis apparently occurred before this.   PAST MEDICAL HISTORY:  Again, pseudotumor cerebri,  gastroparesis,  gastroesophageal reflux disease, bronchopneumonia this admission,  ovarian venous thrombosis, and rupture/tear of the left Achilles.   PAST SURGICAL HISTORY:  Status post repair of  the left Achilles 3 weeks  ago.  She is status post hysterectomy.   FAMILY HISTORY:  Diabetes.   SOCIAL HISTORY:  She is married and has 3 children.  No tobacco or  alcohol use.  No illicit drug use.   ALLERGIES:  None known.   REVIEW OF SYSTEMS:  As stated in the history of present illness,  otherwise unrevealing.   PHYSICAL EXAMINATION:  GENERAL:  Shows an overweight pleasant lady in no  acute distress.  VITAL SIGNS:  Temperature 98.7, pulse 74, respirations 24, and blood  pressure 127/76.  HEENT:  Normocephalic and atraumatic.  NECK:  Supple.  ABDOMEN:  Soft.  EXTREMITIES:  Significant pain and reduced range of motion of the left  ankle.  She seems to have significant weakness there.  No significant  swelling or edema.  MENTATION:  The patient is awake and alert.  Speech, language, and  cognition are intact.  CRANIAL NERVES:  The disk space is slightly blurred, and I could not  clearly see spontaneous venous palpation.  Extraocular movements are  intact.  The visual fields are  full.  Facial muscle strength is  symmetric.  Tongue is midline.  Uvula midline.  Shoulder shrug is  normal.  MOTOR:  In the left ankle is less for this week status post surgery.  All the muscle groups show normal tone, bulk, and strength including the  proximal left upper extremity.  There is no pronator drift or reflexes  present.  Sensation normal to temperature and to light touch.  MRI done  in September 2007 was normal.   ASSESSMENT:  1. Likely recurrent pseudotumor cerebri.  She is currently being      evaluated by Gastroenterology, and her coagulation has been held.  2. Obesity.  3. Left ankle weakness status post surgery.   RECOMMENDATIONS:  1. MRI of brain.  2. She will need to have lumbar spine tapped with serial intracranial      pressure again as she appears to be symptomatic referable to the      pseudotumor.  We will likely view this under fluoroscopic guidance      given that she  was a hard spinal tap previously.  3. We will go ahead and start her on Diamox and also low-dose Topamax.      We will have to watch her CO2 and other electrolytes periodically      on these medications.      Kofi A. Gerilyn Pilgrim, M.D.  Electronically Signed     KAD/MEDQ  D:  07/24/2008  T:  07/25/2008  Job:  [id number]

## 2011-02-18 NOTE — Group Therapy Note (Signed)
[name], [name]               ACCOUNT NO.:  [id number]   MEDICAL RECORD NO.:  [id number]          PATIENT TYPE:  INP   LOCATION:  A317                          FACILITY:  [facility]   PHYSICIAN:  Angus G. Renard Matter, MD   DATE OF BIRTH:  1976-01-30   DATE OF PROCEDURE:  DATE OF DISCHARGE:                                 PROGRESS NOTE   This patient continues to have anterior chest pain and upper abdominal  pain and reflux symptoms.  She does have recurrent pseudotumor cerebri  and has been evaluated by Neurology.  Has been treated with Diamox.  Gastroenterology Service has ordered HIDA scan to evaluate for potential  gallbladder disease.  The patient did have an abnormal EKG initially and  then another EKG, which was normal today.  She continues to have  symptoms of reflux and chest pain and upper abdominal pain when she is  sitting and lying down.   OBJECTIVE:  VITAL SIGNS:  Blood pressure 103/60, respiration 20, pulse  84, and temperature 98.2.  LUNGS:  Clear to P and A.  HEART:  Regular rhythm.  ABDOMEN:  No palpable organs or masses.   ASSESSMENT:  The patient continues with the above-stated problems.  Plan  to proceed with HIDA scan probably today.      Angus G. Renard Matter, MD  Electronically Signed     AGM/MEDQ  D:  07/29/2008  T:  07/29/2008  Job:  [id number]

## 2011-02-18 NOTE — Group Therapy Note (Signed)
NAMEEDDITH, MENTOR               ACCOUNT NO.:  [id number]   MEDICAL RECORD NO.:  [id number]          PATIENT TYPE:  INP   LOCATION:  A317                          FACILITY:  [facility]   PHYSICIAN:  Angus G. Renard Matter, MD   DATE OF BIRTH:  March 30, 1976   DATE OF PROCEDURE:  DATE OF DISCHARGE:                                 PROGRESS NOTE   This patient was admitted with multiple medical problems.  Throughout  the night, she had nausea and vomiting and upper abdominal discomfort.  She does have a history of right ovarian vein thrombosis, history of  left Achilles tendon rupture and repair, does have history of mild  duodenitis, does have early pneumonia right upper lobe, markedly  distended stomach on admission, possible gastric outlet obstruction.  She is being followed by GI Service, and Cardiology Service.  A 2-D echo  was ordered with no significant abnormalities.   OBJECTIVE:  VITAL SIGNS:  Blood pressure 110/64, respiration 20, pulse  73, temperature 97.9, O2 sats 97.  LUNGS:  Clear to P&A.  HEART:  Regular rhythm.  ABDOMEN:  Slightly distended.   LABORATORY DATA:  CBC remains relatively normal.  INR 1.6, potassium 3.1  on admission.   ASSESSMENT:  The patient is admitted with above-stated problems.   PLAN:  To continue current medications.  Replete potassium.  Repeat  chest x-ray today.      Angus G. Renard Matter, MD  Electronically Signed     AGM/MEDQ  D:  07/19/2008  T:  07/19/2008  Job:  [id number]

## 2011-02-18 NOTE — Group Therapy Note (Signed)
[name], [name]               ACCOUNT NO.:  [id number]   MEDICAL RECORD NO.:  [id number]          PATIENT TYPE:  INP   LOCATION:  A317                          FACILITY:  [facility]   PHYSICIAN:  Angus G. Renard Matter, MD   DATE OF BIRTH:  04/24/1976   DATE OF PROCEDURE:  08/01/2008  DATE OF DISCHARGE:                                 PROGRESS NOTE   This patient continues to have upper abdominal pain, reflux symptoms,  occasional nausea.  She does have recurrent pseudotumor cerebri, does  have evidence of abnormal gallbladder response to fatty meals  stimulation and significantly decreased gallbladder ejection fraction.  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   OBJECTIVE:  VITAL SIGNS:  Blood pressure 112/70, respirations 20, pulse  84, temp 98.3.  HEART:  Regular rhythm.  LUNGS:  Clear to P&A.  ABDOMEN:  Tender upper quadrants.   ASSESSMENT:  The patient has evidence of abnormal gallbladder response  to fatty meal, decreased gallbladder ejection fraction 9%, thought to  have biliary dyskinesia.   PLAN:  To proceed with laparoscopic cholecystectomy today by Dr. Lovell Sheehan  and Dr. Leticia Penna.      Angus G. Renard Matter, MD  Electronically Signed     AGM/MEDQ  D:  08/02/2008  T:  08/02/2008  Job:  [id number]

## 2011-02-18 NOTE — Group Therapy Note (Signed)
[name], [name]               ACCOUNT NO.:  [id number]   MEDICAL RECORD NO.:  [id number]           PATIENT TYPE:  INP   LOCATION:  A317                          FACILITY:  [facility]   PHYSICIAN:  Angus G. McInnis, MD   DATE OF BIRTH:  1976-06-02   DATE OF PROCEDURE:  DATE OF DISCHARGE:                                 PROGRESS NOTE   This patient has multiple medical problems, history of right ovarian  vein thrombosis for which she has been chronically anticoagulated,  history of left Achilles tendon rupture and repair for which sutures  have been removed; she has known duodenitis, early pneumonia which is  improved, distended stomach on admission, possible gastric outlet  obstruction, although the previous test showed complete emptying of  stomach.   OBJECTIVE:  VITAL SIGNS:  Blood pressure 126/76, respiration 24, pulse  84, temperature 98.7, O2 sats ranges from 95-98%.  LUNGS:  Clear to P&A.  HEART:  Regular rhythm.  ABDOMEN:  No palpable organs or masses.   ASSESSMENT:  The patient was admitted with above-stated problems,  continues to have episodes of nausea, continues to have chronic  headaches; plan to continue current regimen.  We will attempt to get the  patient into physical therapy and also she will be seen and consultation  by Neurology regarding her headaches.      Angus G. Renard Matter, MD  Electronically Signed     AGM/MEDQ  D:  07/24/2008  T:  07/24/2008  Job:  [id number]

## 2011-02-18 NOTE — Group Therapy Note (Signed)
[name], [name]               ACCOUNT NO.:  [id number]   MEDICAL RECORD NO.:  [id number]          PATIENT TYPE:  INP   LOCATION:  A317                          FACILITY:  [facility]   PHYSICIAN:  Angus G. 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  She is feeling some better, but continues  to have headache, intermittent, and left chest pain, also occasional  abdominal pain.   OBJECTIVE:  VITAL SIGNS:  Blood pressure 110/64, respiration 20, pulse  81, temperature 98.2, O2 sats 97-98%.  LUNGS:  Clear to P&A.  HEART:  Regular rhythm.  ABDOMEN:  No palpable organs or masses.   LABORATORY DATA:  Chemistries remain normal.   ASSESSMENT:  The patient continues to be bothered with above-stated  problems.   PLAN:  To continue current regimen, but may discontinue antibiotic.  Continue anticoagulation.      Angus G. Renard Matter, MD  Electronically Signed     AGM/MEDQ  D:  07/22/2008  T:  07/22/2008  Job:  [id number]

## 2011-02-18 NOTE — Group Therapy Note (Signed)
[name], [name]               ACCOUNT NO.:  [id number]   MEDICAL RECORD NO.:  [id number]           PATIENT TYPE:  INP   LOCATION:  A317                          FACILITY:  [facility]   PHYSICIAN:  [name] G. Renard Matter, MD   DATE OF BIRTH:  [birth year]   DATE OF PROCEDURE:  DATE OF DISCHARGE:                                 PROGRESS NOTE   HISTORY OF PRESENT ILLNESS:  This patient has recurrent pseudotumor  cerebri.  She has spinal tap done by Dr. Gerilyn [name] yesterday, was begun  on Diamox.  She is being evaluated by Gastroenterology Service and felt  to have normal stomach and esophagus.  Diet has been advanced because of  continued chest pain.  She had electrocardiogram, which showed evidence  of anteroseptal infarct, age undetermined.  Her cardiac panel CPK 48,  CPK-MB 0.7, troponin 0.03.  Spinal fluid have 40-80 lymphocytes, 15-45  monocytes, glucose, and protein essentially normal.   OBJECTIVE:  VITAL SIGNS:  Blood pressure 105/54, respirations 18, pulse  77, and temperature 98.2.  LUNGS:  Clear to P and A.  HEART:  Regular rhythm  ABDOMEN:  No palpable organs or masses.   ASSESSMENT:  The patient has multiple medical problems, history of  ovarian vein thrombosis, history of left Achilles tendon repair, known  duodenitis, early pneumonia, which is improved, pseudotumor cerebri.  The patient now has a consideration of underlying coronary artery  disease.   PLAN:  To continue current regimen.  Obtain Cardiology consult today as  well.      [name] G. Renard Matter, MD  Electronically Signed     AGM/MEDQ  D:  07/27/2008  T:  07/27/2008  Job:  [id number]

## 2011-02-18 NOTE — Group Therapy Note (Signed)
NAMESULMA, RUFFINO               ACCOUNT NO.:  [id number]   MEDICAL RECORD NO.:  [id number]          PATIENT TYPE:  INP   LOCATION:  A317                          FACILITY:  [facility]   PHYSICIAN:  Angus G. Renard Matter, MD   DATE OF BIRTH:  05/15/1977   DATE OF PROCEDURE:  DATE OF DISCHARGE:                                 PROGRESS NOTE   This patient has multiple medical problems.  He has history of right  ovarian vein thrombosis, history of left Achilles tendon rupture repair,  has known duodenitis, and early pneumonia which has improved, distended  stomach on admission, possible gastric outlet obstruction.  She is being  followed by GI and Cardiology Service.  A 2-D echo showed no significant  abnormalities.  The patient has milder headache and continued  intermittent chest pain.   OBJECTIVE:  VITAL SIGNS:  Blood pressure 142/86, respiration 20, pulse  72, temp 98.5.  LUNGS:  Clear to P&A.  HEART:  Regular rhythm.  ABDOMEN:  No palpable organs or masses.   ASSESSMENT:  The patient continues to have chest pain and upper  abdominal pain.  She was admitted with above-stated problems.   PLAN:  To continue current regimen.  We will discuss with GI Service and  she is scheduled for a consultation with neurologist.      Angus G. Renard Matter, MD  Electronically Signed     AGM/MEDQ  D:  07/23/2008  T:  07/23/2008  Job:  [id number]

## 2011-02-18 NOTE — Group Therapy Note (Signed)
[name], [name]               ACCOUNT NO.:  [id number]   MEDICAL RECORD NO.:  [id number]          PATIENT TYPE:  INP   LOCATION:  A317                          FACILITY:  [facility]   PHYSICIAN:  Angus G. Renard Matter, MD   DATE OF BIRTH:  12/15/1975   DATE OF PROCEDURE:  07/24/2008  DATE OF DISCHARGE:                                 PROGRESS NOTE   This patient has multiple medical problems.  He has a history of right  ovarian vein thrombosis.  He has been chronically anticoagulated for  this.  He does have a history of left Achilles tendon rupture repair,  known duodenitis, and early pneumonia.  Pneumonia has improved.  She  continues to have episodes of pain, nausea, and headache.   OBJECTIVE:  VITAL SIGNS:  Blood pressure 118/75, respiration 20, pulse  73, temp 98.7, O2 sats 94-98%.  LUNGS:  Clear to P&A.  HEART:  Regular rhythm.  ABDOMEN:  No palpable organs or masses.   CBC within normal range.  INR 1.3.   ASSESSMENT:  The patient continues to have above-stated problems.  She  is being evaluated for possible gastroesophageal reflux disease.  Will  continue current meds.      Angus G. Renard Matter, MD  Electronically Signed     AGM/MEDQ  D:  07/25/2008  T:  07/25/2008  Job:  [id number]

## 2011-02-18 NOTE — Consult Note (Signed)
NAME:  [name], [name]               ACCOUNT NO.:  [id number]   MEDICAL RECORD NO.:  [id number]          PATIENT TYPE:  AMB   LOCATION:  DAY                           FACILITY:  [facility]   PHYSICIAN:  R. Roetta Sessions, M.D. DATE OF BIRTH:  [birth year]   DATE OF CONSULTATION:  05/18/2008  DATE OF DISCHARGE:                                 CONSULTATION   REASON FOR CONSULTATION:  Severe pain in the lower stomach.   HISTORY OF PRESENT ILLNESS:  The patient is a 35 year old African  American female, patient of Dr. Butch Penny, who presents today at the  request of Dr. Renard Matter for further evaluation of severe lower abdominal  pain.  The patient states that she has had difficult couple of months.  She started having lower abdominal pain, which was quite severe.  This  led to a CT of the abdomen and pelvis on April 21, 2008, which  demonstrated a right ovarian vein thrombus, felt could be acute due to  its central location.  There was mild prominence in both ovaries.  Therefore, a pelvic ultrasound was recommended.  On pelvic ultrasound,  she was found to be status post hysterectomy.  There was a question of  small left ovarian or paraovarian cyst.  The right ovary was normal in  size and morphology.  No mass was seen.  Internal blood flow was seen  within both ovaries by Doppler.  On May 04, 2008, she had a CT angio of  the chest for the left abdominal pain radiating up to the chest  associated with shortness of breath.  There was no evidence of pulmonary  embolus.  Exam was limited somewhat due to suboptimal opacification of  the pulmonary artery tree.  Because of ongoing GI symptoms including  dysphagia, nausea, vomiting, the left upper quadrant pain, and chest  pain, she had a upper GI series, Barium pill esophagram on August 4,  which revealed mild duodenitis but otherwise unremarkable.  The patient  states that she is not felt well on a couple of months.  She also notes  that she  continues to be on Lovenox shots which she started on April 21, 2008, due to the fact that her INRs are still subtherapeutic.  She  states her highest INR on Coumadin has been 1.1.  She goes to have it  checked again today.  Currently, she is on Coumadin 10 mg daily.  She  has multiple GI complaints.  She complains of chronic nausea, which she  has had for quite sometime.  She complains of postprandial early  satiety.  She has dysphagia to swallow foods intermittently.  She states  she feels full from her throat down.  She admits to chronic constipation  and has to take epsom salt in order to have a bowel movement.  She  states she has tried everything over-the-counter without relief.  She  has also been on MiraLax daily for a couple of weeks with no relief.  She notes she only eats once a day, because she never feels hungry.  She  also feels very  full with even a small amount of intake.  She complains  of malodorous flatulence and complains that her belches smell like  stool.  She has occasional bright red blood per rectum, felt to be due  to hemorrhoids.  Denies any melena.  She complains of lower abdominal  pain in both quadrants.  She states on the left side, however, it  radiates up the left abdomen and into the lower left chest.   CURRENT MEDICATIONS:  Lipitor 20 mg daily.  She tried MiraLax 17 g at  bedtime, but currently out, Lovenox b.i.d., Coumadin 10 mg daily, Zofran  4 mg every 6 hours as needed, dicyclomine 10 mg q.i.d., Singulair 10 mg  daily, Percocet 7.5/500 mg every 4 hours as needed usually 3-4 a day,  Maxzide 75/50 mg daily, and lisinopril/HCTZ 10/12.5 mg daily.   ALLERGIES:  No known drug allergies.   PAST MEDICAL HISTORY:  Chronic constipation, hypertension,  hypercholesterolemia, history of ruptured disc, right ovarian vein  thrombosis as above.   PAST SURGICAL HISTORY:  Partial hysterectomy for excessive bleeding  several years ago.  She had some sort of vaginal  surgery as a child.  She has had a surgery for hydrocephalus in the past.   FAMILY HISTORY:  Multiple second and third degree relatives of various  cancers, but she is not aware of any colorectal cancer.  Her mother has  diabetes.  Her father was killed in a car accident at age 29.   SOCIAL HISTORY:  She is married.  She has 3 children.  Her youngest is 68  years old.  She is currently out of school due to illness.  She is a  nonsmoker.  Consumes alcoholic beverages only on holidays.   REVIEW OF SYSTEMS:  See HPI for GI.  CONSTITUTIONAL:  No weight loss.  CARDIOPULMONARY:  No chest pain or shortness of breath.  GENITOURINARY:  No dysuria or hematuria.   PHYSICAL EXAMINATION:  VITAL SIGNS:  Weight 240 pounds, stable.  Height  5 feet 9 inches, temp 98.4, blood pressure 120/78, and pulse 60.  GENERAL:  A pleasant, obese African American female in no acute  distress.  SKIN:  Warm and dry.  No jaundice.  HEENT:  Sclerae nonicteric.  Oropharyngeal mucosa moist and pink.  CHEST:  Lungs are clear to auscultation.  CARDIAC:  Regular rate and rhythm.  No murmurs, rubs, or gallops.  ABDOMEN:  Positive bowel sounds.  Abdomen is obese, but symmetrical.  She has tenderness in the right lower quadrant region to deep palpation.  She also has tenderness in the left.  Lower abdomen radiates up the left  side and in to the epigastrium.  No rebound or guarding.  No  organomegaly or masses.  No abdominal bruits or hernias.  LOWER EXTREMITIES:  No edema.   Available labs are from May 04, 2008, a normal MET-7.  Her INR was 1.1.  On May 08, 2008, her INR was 1.   IMPRESSION:  The patient is a 35 year old lady with multiple  gastrointestinal complaints.  She was referred for low abdominal pain.  She was diagnosed with a right ovarian vein thrombosis on April 21, 2008.  She has been on Lovenox injection and Coumadin since that time.  She  remains subtherapeutic.  She is not sure why she developed ovarian  vein  thrombosis.  Her workup thus far has included of course a CT of the  abdomen and pelvis as well as the CT chest angio, upper  gastrointestinal  series with barium pill swallow.  Only other gastrointestinal finding  has been that of a mild duodenitis.  She does complain a lot of  postprandial, early satiety and fullness.  She has typical heartburn  symptoms as well.  She states that she does have refractory  gastroesophageal reflux disease and may have some gastroparesis.  We  need to further evaluate the duodenitis seen at the time of EGD.  She  has chronic constipation, which is poorly controlled and likely will  help improve her overall discomfort.   Likely some of her pain especially the right lower quadrant is related  to her right ovarian vein thrombosis.   PLAN:  1. We will begin her on Prilosec OTC 20 mg daily, #20 samples      provided.  She has her INR checked frequently therefore, should not      be any issues with her Coumadin.  2. I will have her stop her dicyclomine, which is likely making her      constipation worse.  3. Trial of Amitiza 24 mcg once daily, may increase to twice daily as      needed to be taken with food, #20 samples provided.  4. She may continue MiraLax in addition to the Amitiza if needed.  5. EGD with Dr. Jena Gauss on Monday.  Likely, her INR will still be      subtherapeutic and it should not be an issue.  We will get her INR      checked today and go from there.  6. Further recommendations to follow.      Tana Coast, P.AJonathon Bellows, M.D.  Electronically Signed    LL/MEDQ  D:  05/18/2008  T:  05/19/2008  Job:  [id number]   cc:   Angus G. Renard Matter, MD  Fax: [phone]

## 2011-02-21 NOTE — Op Note (Signed)
[facility]  Patient:    NIMRIT, KEHRES                      MRN: [id number] Proc. Date: 03/22/01 Adm. Date:  [id number] Attending:  Leonard Schwartz CC:         Ms. Chilton Si,  [place]   Operative Report  PREOPERATIVE DIAGNOSIS: 1. Dysmenorrhea. 2. Menorrhagia. 3. Dyspareunia. 4. Adenomyosis.  POSTOPERATIVE DIAGNOSIS: 1. Dysmenorrhea. 2. Menorrhagia. 3. Dyspareunia. 4. Adenomyosis.  OPERATION:  Laparoscopically assisted vaginal hysterectomy.  SURGEON:  Janine Limbo, M.D.  ASSISTANT:  Henreitta Leber, P.A.  ANESTHESIA:  General.  DISPOSITION:  The patient is a 35 year old female, para 3-0-0-3, who presents with the above-mentioned diagnosis.  She has not improved with hormonal therapy nor pain medicine.  She understands the indications for her procedure and she accepts the risks of, but not limited to, anesthetic complications, bleeding, infections and possible damage to the surrounding organs.  FINDINGS:  The uterus was eight to 10 weeks size and smooth.  The fallopian tubes and the ovaries appeared normal.  The patient does have a history of IUD use but there was no evidence of pelvic inflammatory disease.  DESCRIPTION OF PROCEDURE:  The patient was taken to the operating room where a general anesthetic was given.  The patients abdomen, perineum, and vagina were prepped with multiple layers of Betadine.  A Foley catheter was placed in the bladder.  The patient was sterilely draped.  Examination under anesthesia was performed.  A Hulka tenaculum was placed inside the uterus.  The subumbilical area was injected with 4 cc of 0.5% Marcaine.  An incision was made and the Veress needle was inserted into the abdominal cavity without difficulty.  Proper placement was confirmed using the saline drop test.  A pneumoperitoneum was obtained.  The laparoscopic trocar and then the laparoscope were substituted for  the Veress needle.  The pelvic structures were identified with findings as mentioned above.  Because there was no evidence of adhesions formation, we felt that it was now appropriate to proceed with vaginal hysterectomy.  The patient was then prepared.  A weighted speculum was placed in the posterior vagina.  The cervix was injected with 40 cc of a diluted solution of Pitressin and saline.  A circumferential incision was made around the cervix.  The vaginal mucosa was advanced both anteriorly and posteriorly.  The anterior cul-de-sac was sharply entered.  The posterior cul-de-sac was sharply entered.  Alternating from right to left, the uterosacral ligaments, paracervical tissues, parametrial tissues and uterine arteries were clamped, cut, sutured and tied securely.  The uterus was inverted through the posterior colpotomy.  The upper pedicles were then clamped and cut.  The upper pedicles were secured using free ties and then suture ligatures.  The left fallopian tube was noted to have bleeding and this was then clamped, cut and suture ligated.  The sutures attached to the uterosacral ligaments were brought out through the vaginal angles and tied securely.  A McCall culdoplasty suture was placed in the posterior cul-de-sac incorporating the uterosacral ligament and the posterior peritoneum.  A final check was made for hemostasis and again hemostasis was adequate.  The vaginal cuff was closed using figure-of-eight sutures incorporating the anterior vaginal mucosa, the anterior peritoneum, the peritoneum, and the posterior vaginal mucosa.  The McCall culdoplasty suture was tied securely and the apex of the vagina was noted to elevate into the midpelvis.  We then  reinflated the abdomen through the laparoscope.  The pelvis was inspected and there was no evidence of bleeding.  There was no evidence of harm to the vital structures in the pelvis.  The bowel was carefully inspected and there was no  evidence of trocar damage to the bowel.  All instruments were then removed.  The subumbilical incision was closed using deep and superficial sutures of 3-0 Vicryl.  Sponge, needle and instrument counts were correct on two occasions. Estimated blood loss for this procedure was 75 cc.  The patient tolerated the procedure well.  She was noted to drain clear yellow urine at the end of her procedure. DD:  03/22/01 TD:  03/22/01 Job: [id number]

## 2011-02-21 NOTE — Discharge Summary (Signed)
[name], [name]               ACCOUNT NO.:  [id number]   MEDICAL RECORD NO.:  [id number]          PATIENT TYPE:  INP   LOCATION:  A317                          FACILITY:  [facility]   PHYSICIAN:  Angus G. Renard Matter, MD   DATE OF BIRTH:  08-15-1976   DATE OF ADMISSION:  07/17/2008  DATE OF DISCHARGE:  10/30/2009LH                               DISCHARGE SUMMARY   A 35 year old African American female admitted on July 17, 2008 and  discharged on August 04, 2008, 18 days hospitalization.   DIAGNOSES:  Thrombosis of ovarian vein, rupture of left Achilles tendon,  bronchopneumonia right upper lobe, chest pain of undetermined etiology  possibly related to pneumonitis, gastroesophageal reflux, gastroparesis,  pseudotumor cerebri, duodenitis, hypokalemia, irritable bowel syndrome,  hyperlipidemia, hypertension, gastroesophageal reflux, bronchopneumonia.  The patient's condition was stable at the time of her discharge.   HISTORY OF PRESENT ILLNESS:  This 35 year old African American female  was seen in the office on day of admission with chief complaint of chest  pain, low grade fever, and slight cough.  The patient has had multiple  problems over the past 2 months, was evaluated for lower abdominal pain  and found to have thrombus in the right ovarian vein.  This was  diagnosed in August.  She is placed on Lovenox subcutaneously to begin  with and then was anticoagulated with Coumadin.  Subsequently while at  an athletic event, she jumped up from her seat and ruptured her left  Achilles tendon.  She had been troubled with this and finally had it  repaired on July 05, 2008, by [facility] physician, Dr. Gerrit Heck at  [facility]  During this period of time, she had to be  taken off her anticoagulation and subsequently was reanticoagulated.  The patient throughout this time had gastrointestinal symptoms, early  satiety, fullness in her upper abdomen and heartburn.  She had  been seen  by Gastroenterology Service and given a trial of Amitiza 24 mcg daily.  This was increased to twice a day along with MiraLax.  Apparently did  have EGD by Dr. Jena Gauss and was thought to have mild duodenitis.  She  continued to have some intermittent chest pain which she describes being  in the anterior chest.  Subsequent CT of the abdomen revealed markedly  distended stone with possible gastric outlet obstruction.  No mass or  inflammatory process.  CT of the chest showed new mild infiltrate in the  right upper lobe consistent with early pneumonia.  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  No acute findings in  pelvis.  Mild diffuse distal colonic wall thickening, consideration of  inflammatory bowel disease or infectious colitis.  With these patient's  symptoms and problems, it was felt that she should be hospitalized for  further evaluation and pain control.   PHYSICAL EXAMINATION:  GENERAL:  Uncomfortable African American female.  VITAL SIGNS:  Blood pressure 120/80, pulse 60, respirations 18,  temperature 98.  HEENT:  Eyes, PERRLA.  TMs negative.  Oropharynx benign.  NECK:  Supple.  No JVD or thyroid  abnormalities.  LUNGS:  Clear to P and A.  HEART:  Regular rhythm.  No murmurs.  No cardiomegaly.  ABDOMEN:  Slightly obese.  No palpable organs or masses but tenderness  in midabdomen.  EXTREMITIES:  Free of edema.  The patient does have a cast on left lower  leg.   LABORATORY DATA:  Admission CBC, WBC 7100 with hemoglobin of 12.7,  hematocrit 36.5.  Throughout the entire hospitalization, she had  multiple CBCs done which remained well within normal range with the  exception of slightly lower hemoglobin at 10.9, hematocrit 31.8.  The  patient's prothrombin time and INR within normal range.  Chemistries on  July 17, 2008, sodium 137, potassium 3.1, chloride 100, CO2 of 29,  glucose 106, BUN 12, creatinine 0.71 and on August 02, 2008 sodium  140,  potassium 3.4, chloride 111, CO2 of 21, glucose 107, BUN 6, creatinine  0.77, GFR greater than 60.  Liver enzymes, SGOT 18, SGPT 20, alkaline  phosphatase 54, bilirubin 0.2.  Cardiac markers series were within  normal range.  Blood culture showed no growth after 5 days.   X-rays, the nuclear hepatobiliary imaging study patent biliary tree,  abnormal gallbladder with positive fatty meals stimulation with  significant decreased gallbladder function, ejection fraction of 9%.  Diagnostic lumbar puncture, fluoroscopic guided lumbar puncture opening  pressure 30.2 cm, CSF collected and transported to the lab.  CT of the  head, normal examination.  Nuclear medicine gastric emptying study,  normal gastric emptying study.  Chest x-ray, no active lung disease.  Electrocardiogram, normal sinus rhythm, QRS decreased duration.   HOSPITAL COURSE:  The patient at the time of her admission was placed on  D5 half-normal saline at 125 mL/hour.  Vital signs were monitored four  times a day, an NG tube was inserted and put to moderate suction.  She  is placed on nasal O2 at 3 L per minute, neb treatments with  albuterol/Atrovent every 4 hours while awake was ordered.  Lovenox a  milligram b.i.d. was started.  She was given IV Dilaudid a milligram  every 3-4 hours p.r.n. for pain.  Because of the findings of  bronchopneumonia, she was started on IV Rocephin 1 gram daily along with  Zithromax 500 mg daily.  On July 17, 2008, the NG tube was  discontinued.  Because of continued complaint of substernal pain, she  had cardiac markers done which remained normal.  She was seen by  Cardiology.  Echocardiogram was ordered and also seen by  Gastroenterology Service.  Because of low serum potassium on July 19, 2008, she was given three rounds of KCl 10 mEq each.  She was  subsequently placed on IV Protonix 40 mg every 24 hours and Amitiza 24  mcg b.i.d. was added.  Gastric emptying study was performed  which was  normal.  The patient continued to complain of pain in the epigastric  area.  She was started back on Coumadin on July 20, 2008 and the  dosage was monitored by pharmacy.  Because of continued complaints of  headache, she was seen in consultation by Dr. Gerilyn Pilgrim who felt that she  had pseudotumor cerebri.  A spinal tap was performed and with normal  results.  She did have a EGD done by Dr. Jena Gauss and noted to have tubular  esophagus, entirely normal esophageal mucosa, and EG junction.  Their  opinion was that she had a normal esophagus and stomach.  D1 and D2  stent placement  with Bravo pH probe.  Zegerid 40 mg was ordered every 12  hours.  Antireflux measures were ordered.  The patient showed very slow  improvement but gradual improvement throughout her hospital stay.  Her  diet was advanced.  She was gradually ambulated.  Her Coumadin was  monitored.  She was started on Carafate 1 g a.c. and at bedtime.  On  July 15, 2008, it was felt that probably the patient could be  discharged home.   DISCHARGE MEDICATIONS:  The patient was discharged on Carafate 1 g  before meals and at bedtime, Duragesic patch 25 mcg every 3 days,  Amitiza 24 mcg twice a day, Diamox 250 mg b.i.d., Topamax 75 mg daily,  Zegerid 40 mg b.i.d., MiraLax 17 g b.i.d., Celebrex 200 mg b.i.d., Xanax  0.5 mg q.4 h. p.r.n.   CONDITION ON DISCHARGE:  The patient was stable and improved at time of  discharge.      Angus G. Renard Matter, MD  Electronically Signed     AGM/MEDQ  D:  09/06/2008  T:  09/06/2008  Job:  [id number]

## 2011-02-21 NOTE — H&P (Signed)
[facility]  Patient:    [name], [name]                        MRN: [id number] Attending:  Janine Limbo, M.D. CC:         Ms. Cathie Beams, [place], [place]   History and Physical  DATE OF BIRTH:                [birth year]  HISTORY OF PRESENT ILLNESS:   The patient is a 35 year old female, para 3-0-0-3, who presents for a laparoscopically assisted vaginal hysterectomy because of menorrhagia, dysmenorrhea and dyspareunia.  The patients discomfort has now progressed to the point that she is having difficulty performing her normal functions both at home and at work.  The patient has had an ultrasound and there was a question of adenomyosis.  She had a negative endometrial biopsy.  Her most recent Pap smear was within normal limits.  The patient has been treated in the past with birth control pills as well as nonsteroidal anti-inflammatory agents.  They have not been able to relieve her discomfort.  The patient has had an IUD in the past.  She denies a past history of sexually transmitted infections.  Her husband has had a vasectomy. The patient was treated with Depo-Provera but it caused severe headaches; she was taken off this medication by her family physician.  She now wants to proceed with definitive therapy.  OBSTETRICAL HISTORY:          The patient has had three vaginal deliveries at term.  PAST MEDICAL HISTORY:         The patient had gestational hypertension.  She has a ruptured disk in her lower back and she is being followed by her physician in [place].  DRUG ALLERGIES:               None known.  SOCIAL HISTORY:               The patient drinks wine coolers occasionally. She denies cigarette use.  She denies other recreational drug uses.  CURRENT MEDICATIONS:          The patient takes ginseng, iron, and carbohydrate blockers to help her lose weight.  REVIEW OF SYSTEMS:            The patient complains of  dyspareunia.  FAMILY HISTORY:               The patients great-grandmother had stomach cancer.  Her maternal grandmother had hypertension and diabetes.  PHYSICAL EXAMINATION:  GENERAL:                      Weight is 213 pounds.  Height is 5 feet 8 inches.  HEENT:                        Within normal limits.  CHEST:                        Clear.  HEART:                        Regular rate and rhythm.  BREASTS:                      Without masses.  ABDOMEN:  Nontender.  EXTREMITIES:                  Within normal limits.  NEUROLOGIC:                   Grossly normal.  PELVIC:                       External genitalia is normal.  Vagina is normal. Cervix is nontender.  The uterus is upper-limits-normal-size, slightly tender to exam, and smooth.  Adnexa:  No masses.  Rectovaginal exam confirms.  ASSESSMENT:                   1. Dysmenorrhea.                               2. Menorrhagia.                               3. Dyspareunia.                               4. Questionable adenomyosis.  PLAN:                         We discussed the options for management.  We discussed further medical therapy but because of the problems she has had in the past, she declines further birth control pill use and Depo-Provera use. Pain medications have not relieved her discomfort.  We discussed endometrial ablation and uterine artery embolization.  The patient feels that she wants to proceed with definitive therapy.  The risks and benefits of hysterectomy were reviewed including, but not limited to, anesthetic complications, bleeding, infections and possible damage to the surrounding organs.  She accepts these risks and she is ready to proceed.  The patient has a history of IUD use; for that reason, we are going to begin our procedure with a diagnostic laparoscopy.  The patient was also given doxycycline 100 mg to take twice each day for one week prior to her procedure. DD:   03/21/01 TD:  03/21/01 Job: [id number]

## 2011-03-04 ENCOUNTER — Emergency Department (HOSPITAL_COMMUNITY): Payer: Medicaid Other

## 2011-03-04 ENCOUNTER — Emergency Department (HOSPITAL_COMMUNITY)
Admission: EM | Admit: 2011-03-04 | Discharge: 2011-03-05 | Disposition: A | Discharge status: Home / Self Care | Payer: Medicaid Other | Attending: Emergency Medicine | Admitting: Emergency Medicine

## 2011-03-04 DIAGNOSIS — J45901 Unspecified asthma with (acute) exacerbation: Secondary | ICD-10-CM | POA: Insufficient documentation

## 2011-03-04 DIAGNOSIS — R0602 Shortness of breath: Secondary | ICD-10-CM | POA: Insufficient documentation

## 2011-05-06 ENCOUNTER — Inpatient Hospital Stay (HOSPITAL_COMMUNITY)
Admission: EM | Admit: 2011-05-06 | Discharge: 2011-05-09 | DRG: 103 | Disposition: A | Discharge status: Home / Self Care | Payer: Medicaid Other | Attending: Internal Medicine | Admitting: Internal Medicine

## 2011-05-06 ENCOUNTER — Emergency Department (HOSPITAL_COMMUNITY): Payer: Medicaid Other

## 2011-05-06 DIAGNOSIS — G911 Obstructive hydrocephalus: Secondary | ICD-10-CM | POA: Diagnosis present

## 2011-05-06 DIAGNOSIS — J45909 Unspecified asthma, uncomplicated: Secondary | ICD-10-CM | POA: Diagnosis present

## 2011-05-06 DIAGNOSIS — G932 Benign intracranial hypertension: Principal | ICD-10-CM | POA: Diagnosis present

## 2011-05-06 DIAGNOSIS — R51 Headache: Secondary | ICD-10-CM

## 2011-05-06 DIAGNOSIS — E7800 Pure hypercholesterolemia, unspecified: Secondary | ICD-10-CM | POA: Diagnosis present

## 2011-05-06 DIAGNOSIS — K5900 Constipation, unspecified: Secondary | ICD-10-CM | POA: Diagnosis present

## 2011-05-06 LAB — DIFFERENTIAL
Basophils Absolute: 0 10*3/uL (ref 0.0–0.1)
Basophils Relative: 0 % (ref 0–1)
Eosinophils Absolute: 0.4 10*3/uL (ref 0.0–0.7)
Eosinophils Relative: 6 % — ABNORMAL HIGH (ref 0–5)
Monocytes Absolute: 0.5 10*3/uL (ref 0.1–1.0)

## 2011-05-06 LAB — CBC
MCH: 29.7 pg (ref 26.0–34.0)
MCHC: 34.5 g/dL (ref 30.0–36.0)
RDW: 13.4 % (ref 11.5–15.5)

## 2011-05-06 LAB — CSF CELL COUNT WITH DIFFERENTIAL
Eosinophils, CSF: 0 % (ref 0–1)
Tube #: 1

## 2011-05-06 LAB — PROTEIN AND GLUCOSE, CSF
Glucose, CSF: 63 mg/dL (ref 43–76)
Total  Protein, CSF: 19 mg/dL (ref 15–45)

## 2011-05-06 LAB — POCT I-STAT, CHEM 8
Glucose, Bld: 98 mg/dL (ref 70–99)
HCT: 46 % (ref 36.0–46.0)
Hemoglobin: 15.6 g/dL — ABNORMAL HIGH (ref 12.0–15.0)
Potassium: 4.1 mEq/L (ref 3.5–5.1)
Sodium: 140 mEq/L (ref 135–145)
TCO2: 24 mmol/L (ref 0–100)

## 2011-05-06 LAB — APTT: aPTT: 25 seconds (ref 24–37)

## 2011-05-06 LAB — GLUCOSE, CAPILLARY

## 2011-05-06 LAB — GRAM STAIN

## 2011-05-06 LAB — COMPREHENSIVE METABOLIC PANEL
AST: 23 U/L (ref 0–37)
Alkaline Phosphatase: 61 U/L (ref 39–117)
CO2: 24 mEq/L (ref 19–32)
Chloride: 105 mEq/L (ref 96–112)
Creatinine, Ser: 0.71 mg/dL (ref 0.50–1.10)
GFR calc non Af Amer: >60 mL/min (ref >60)
Potassium: 4.1 mEq/L (ref 3.5–5.1)
Total Bilirubin: 0.1 mg/dL — ABNORMAL LOW (ref 0.3–1.2)

## 2011-05-06 LAB — PROTIME-INR: INR: 0.93 (ref 0.00–1.49)

## 2011-05-06 LAB — TROPONIN I: Troponin I: <0.3 ng/mL (ref <0.30)

## 2011-05-07 ENCOUNTER — Encounter: Payer: Self-pay | Admitting: Ophthalmology

## 2011-05-07 ENCOUNTER — Inpatient Hospital Stay (HOSPITAL_COMMUNITY): Payer: Medicaid Other

## 2011-05-07 DIAGNOSIS — R51 Headache: Secondary | ICD-10-CM

## 2011-05-07 DIAGNOSIS — G932 Benign intracranial hypertension: Secondary | ICD-10-CM

## 2011-05-07 LAB — CBC
HCT: 41 % (ref 36.0–46.0)
RBC: 4.8 MIL/uL (ref 3.87–5.11)
RDW: 13.4 % (ref 11.5–15.5)
WBC: 10.3 10*3/uL (ref 4.0–10.5)

## 2011-05-07 LAB — BASIC METABOLIC PANEL
BUN: 8 mg/dL (ref 6–23)
CO2: 20 mEq/L (ref 19–32)
Chloride: 105 mEq/L (ref 96–112)
GFR calc Af Amer: >60 mL/min (ref >60)
Potassium: 4.4 mEq/L (ref 3.5–5.1)
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
## 2011-05-07 NOTE — Consult Note (Signed)
NAMEDIARRA, CEJA NO.:  [id number]  MEDICAL RECORD NO.:  [id number]  LOCATION:  3037                         FACILITY:  [facility]  PHYSICIAN:  Levie Heritage, MD       DATE OF BIRTH:  1976-04-05  DATE OF CONSULTATION:  05/06/2011 DATE OF DISCHARGE:                                CONSULTATION   REFERRING PHYSICIAN:  ER Team.  REASON FOR CONSULTATION:  Code stroke.  CHIEF COMPLAINT:  Left-sided paresthesias.  HISTORY OF PRESENT ILLNESS:  This patient is a 35 year old African American woman who states that she started noticing left face, arm and leg painful, shocking-like sensation almost couple hours ago while driving and she came into the emergency.  She states that majority of those feelings have already resolved.  She can still feel a very subtle left facial painful sensation, but other than that, denies any neurological deficit currently.  She does state that she has off and on visual blurriness as well with or without above symptoms and she attributes these changes to the known pseudotumor cerebri that she has.  PAST MEDICAL HISTORY: 1. She was diagnosed with pseudotumor cerebri at age 35 in [facility], [place] and states that she has been on Diamox for     a while, but now has stopped taking the Diamox. 2. Hypertension. 3. Hypercholesterolemia. 4. History of right ovarian vein thrombosis for what she had been on     Coumadin in the past. 5. Left Achilles tendon repair.  MEDICATIONS:  The patient states that she has been on multiple medications in the past including Diamox and blood thinner medications and cholesterol medication, but has not been taking any medications regularly these days.  ALLERGIES:  No known drug allergies.  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  FAMILY HISTORY:  Paternal grandparents had strokes.  The  patient's mother has diabetes mellitus.  REVIEW OF SYSTEMS:  Denies any chest pain.  Denies any shortness of breath.  Denies any change in her vision actively now.  She says that majority of her paresthesias have resolved as well and were very painful.  She has no motor deficits.  Denies any fever.  Denies any rashes. Denies any joint pains.  Denies any sputum production or cough.  Denies any nausea, vomiting, diarrhea.  Denies any burning urination.  Rest of 10-organ review of system unremarkable.  REVIEW OF CLINICAL DATA:  I have seen her CT scan of the head performed today in the emergency department and do not see anything of acute abnormality on this scan.  I have seen her lab results performed today and stroke panel is quite unremarkable.  Her Chem-8 is also unremarkable except mild increased hemoglobin.  PHYSICAL EXAMINATION:  She is comfortably lying down on the bed with vitals, blood pressure of 132/68 mmHg and pulse of 78 per minute.  Her NIH stroke scale now is equal to over less than 1 given the patchy altered sensation on the left side and is not reproducible consistently.  Otherwise, completely benign neurological examination as following.  Bilateral  funduscopy reveals clear disk margins and vascular markings without any signs of increased intraocular pressure.  Awake, oriented x3 in no acute distress.  Moves eyes to all direction. Bilateral pupils reactive to light and accommodation.  No field cut noted on the bedside of examination.  Moves eyes to all direction.  Face symmetrical for strength testing.  Tongue is midline without atrophy or fasciculation.  Palate elevates symmetrically bilaterally.  Motor examination reveals strength 5/5, all over symmetrical.  Sensory examination reveals intact light touch all over, however, there is some alteration in a patchy distribution on the left side that is also not reproducible.  The gait was  deferred.  IMPRESSION:  A 35 year old woman with atypical left-sided painful paresthesias that have resolved already.  Her symptomatology does not seem to be the result of a central nervous system ischemic event, although the complete possibility can never be ruled out in such settings on clinical examination alone.  Clinically, I do not see any obvious findings suggestive of worsening hydrocephalus either, however, she has not been on any medications recently.  Not t-PA is not offered as the symptoms do not seem to be of central nervous system ischemia and also because of almost 0 NIH stroke scale.  PLAN:  I have suggested the ER Team to get the patient's lumbar puncture for the opening pressure measurement and if it is more than 20, then start her on Diamox.  If it is less than 20, it is okay, not to start any medication for the hydrocephalus.  She will need the MRI of the brain to rule out the less likely possibility of a CNS ischemic event.  Would suggest starting her on aspirin 81 mg daily basis after the lumbar puncture is done.  I have discussed the details of my impression with the patient as well as with the ER Team.          ______________________________ Levie Heritage, MD     WS/MEDQ  D:  05/06/2011  T:  05/07/2011  Job:  [id number]  Electronically Signed by Levie Heritage MD on 05/07/2011 11:51:14 AM

## 2011-05-08 DIAGNOSIS — R51 Headache: Secondary | ICD-10-CM

## 2011-05-08 DIAGNOSIS — G932 Benign intracranial hypertension: Secondary | ICD-10-CM

## 2011-05-08 LAB — CBC
HCT: 38.8 % (ref 36.0–46.0)
Hemoglobin: 13.3 g/dL (ref 12.0–15.0)
MCHC: 34.3 g/dL (ref 30.0–36.0)
WBC: 12.5 10*3/uL — ABNORMAL HIGH (ref 4.0–10.5)

## 2011-05-08 LAB — BASIC METABOLIC PANEL
BUN: 13 mg/dL (ref 6–23)
Chloride: 104 mEq/L (ref 96–112)
Glucose, Bld: 97 mg/dL (ref 70–99)
Potassium: 3.4 mEq/L — ABNORMAL LOW (ref 3.5–5.1)

## 2011-05-09 LAB — CBC
HCT: 38.1 % (ref 36.0–46.0)
MCHC: 33.3 g/dL (ref 30.0–36.0)
MCV: 86.6 fL (ref 78.0–100.0)
RDW: 13.9 % (ref 11.5–15.5)

## 2011-05-09 LAB — BASIC METABOLIC PANEL
BUN: 16 mg/dL (ref 6–23)
Creatinine, Ser: 0.8 mg/dL (ref 0.50–1.10)
GFR calc Af Amer: >60 mL/min (ref >60)
GFR calc non Af Amer: >60 mL/min (ref >60)

## 2011-05-09 LAB — DIFFERENTIAL
Eosinophils Relative: 3 % (ref 0–5)
Lymphocytes Relative: 44 % (ref 12–46)
Lymphs Abs: 2.9 10*3/uL (ref 0.7–4.0)
Monocytes Absolute: 0.8 10*3/uL (ref 0.1–1.0)

## 2011-05-10 LAB — CSF CULTURE W GRAM STAIN: Culture: NO GROWTH

## 2011-05-12 NOTE — Discharge Summary (Signed)
[name], DOLIN NO.:  [id number]  MEDICAL RECORD NO.:  [id number]  LOCATION:  3037                         FACILITY:  [facility]  PHYSICIAN:  Tilford Pillar, MD     DATE OF BIRTH:  1976-07-07  DATE OF ADMISSION:  05/07/2011 DATE OF DISCHARGE:  05/09/2011                        DISCHARGE SUMMARY - REFERRING   DISCHARGE DIAGNOSES: 1. Pseudotumor cerebri. 2. Hypertension.  DISPOSITION AND FOLLOWUP:  The patient is scheduled to follow up with Dr. Butch Penny on August 08.  This patient was started on Lasix 40 mg daily this hospitalization for her pseudotumor cerebri as well as concurrent potassium supplementation with KCl 20 mEq daily and discharged.  We recommended obtaining a basic metabolic panel at that appointment to assess her potassium level as well as her creatinine.  Last BMET before discharge drawn on May 09, 2011, showed potassium 3.8, creatinine 0.80.  Her pain management will also need to be addressed.  She was given a short course of oxycodone following discharge the last until this follow-up appointment.  The patient also plans to call Dr. Alvester Morin at [facility] whom she is seen in the past to schedule a follow-up appointment as soon as possible for management of her pseudotumor cerebri as an outpatient. Inpatient Neurology consultant, Dr. Thad Ranger, recommended that [name] try to sustain medical therapy before surgical options are considered for pseudotumor cerebri on condition she has had for many years.  PROCEDURES PERFORMED: 1. Lumbar puncture by guided fluoroscopy:  Lumbar puncture was     performed at the L4-L5 level.  Opening pressure was 42 cm of water.     Cerebral spinal fluid 15 mL was drained for laboratory studies.  2. MRI of brain without contrast:  No acute infarct.  No intracranial     hemorrhage.  No intracranial mass or lesion noted on this     unenhanced exam.       Minimal flattening of the superior margin  of the pituitary gland     which is unchanged from prior examination.  The perioptic spaces     did not appear significantly enlarged as can be seen with     pseudotumor cerebri.  There are, however, slightly prominent Meckel     cave greater on the left.  This has been reported as associated     with pseudotumor cerebri.  Appearance is unchanged.      Cervicomedullary junction in pineal region remarkable.  Major     intracranial vascular structures are intact.  Minimal accept     almost.  3. CT head without contrast:  Negative noncontrast CT.  DISCHARGE MEDICATION: 1. Furosemide 40 mg by mouth daily. 2. Potassium chloride 20 mEq by mouth daily. 3. Oxycodone 5 mg tablets, take 1-2 tablets every 6 hours as needed     for pain.  #30 tablets given on May 09, 2011. 4. Colace 100 mg by mouth b.i.d. 5. Senokot-S daily as needed for constipation. 6. Topiramate 75 mg by mouth daily. 7. Celecoxib 200 mg by mouth twice per day. 8. Acetaminophen 325 mg, take 2 tablets every 4 hours as needed for     pain. 9. Alprazolam 0.5  mg tablets, take one-half mg by mouth every 4 hours     as needed. 10.Albuterol inhaler 1-2 puffs every 6 hours as needed.  CONSULTATIONS:  [facility].  ADMITTING HISTORY AND PHYSICAL:  The patient is a 35 year old woman with a history of pseudotumor cerebri who presents with headache and blurry vision without dizziness or double vision.  This has been worsening gradually over the past 2 months.  She also had an acute episode of numbness and weakness in her left face, arm and leg and electric-shock like pain in the left cheek.  This happened today while she was driving and she had one similar episode a week or two ago that resolved spontaneously.  She also reports difficulty with speaking as well as walking and reports that she fell en route to the hospital and had to be taken into wheelchair.  When seen on arrival, the symptoms had resolved. She reports a  history of increased blurry vision whenever her headaches related to pseudotumor cerebri are more severe.  She denies fever, dizziness or double vision.  ADMITTING PHYSICAL EXAMINATION:  VITAL SIGNS:  Temperature 99.2, blood pressure 145/92, heart rate 77, O2 saturation 99%. GENERAL:  Obese gentlewoman resting in bed, appears older than stated age. HEENT:  Pupils are equally round and reactive to light.  Extraocular muscles intact.  No scleral icterus. CARDIAC:  Regular rate and rhythm.  No rubs, murmurs or gallops. PULMONARY:  Clear to auscultation bilaterally.  Moving normal volumes of air. ABDOMEN:  Soft, nontender, nondistended.  Bowel sounds present. EXT:  Warm and well perfused, no pedal edema. NEURO:  Alert and oriented x3.  Cranial nerves II through XII grossly intact.  Sensation to light touch, subjectively slightly decreased in left face in upper and lower extremities.  Strength equal bilaterally.  ADMISSION LABS: 1. CBC:  WBC 7.3, hemoglobin 14.3, hematocrit 41.5, platelets 395. 2. BMET:  Sodium 140, potassium 4.1, chloride 107, BUN 8, creatinine     0.70, glucose 98, calcium 1.22.  INR 0.93.  APTT 25.  Prothrombin     time 12.7.  Total CK 75.  CK-MB 1.7.  Troponin I less than 0.30. 3. LFTs:  Total protein 8.3, albumin 4.2, AST 23, ALT 28, alkaline     phosphatase 61, total bilirubin 0.1. 4. CSF Studies:  Colorless clear fluid.  CSF glucose 63.  CSF total     protein at 19.  CSF RBC count 19.  CSF WBC count 2.  CSF segmented     neutrophils too few to count.  CSF lymphocytes few.  CSF monocyte     macrophage few.  CSF eosinophils 0.  HOSPITAL COURSE: 1. Pseudotumor cerebri:  The patient reported severe headache and left-     sided weakness, numbness and tingling on admission.  Stroke was     ruled out on CT and MRI and pseudotumor cerebri was confirmed with     high opening pressure on lumbar puncture.  The patient was given a     dose of acetazolamide 250 mg on the  morning of May 07, 2011.     That evening, she was given a dose of Lasix 20 mg daily.     Acetazolamide was switched to Lasix at the advice of inpatient     neurology consultant, Dr. Thad Ranger, in the setting of [name]'s     long history of acetazolamide use.  Lasix 40 mg daily was started     the following morning and continued through discharge.  Potassium     was noted to drop from 4.4 on the early morning of August 1st to     3.4 in the early morning of August 2nd.  Potassium chloride 40 mEq     x2 was given on May 08, 2011, and potassium chloride 40 mEq x1     was given on May 09, 2011.  Potassium was 3.8 on discharge.  The     patient was discharged on potassium chloride 20 mEq daily.  By     discharge, Ms. Huron headache was still quite significant but she     explained that it had returned to her baseline.  She also experienced back pain during this admission following her multiple lumbar puncture attempts.  The site had no significant erythema, drainage or other signs of infection, but it was tender to palpation.  Her back pain remained on discharge but was slightly improved, with no neurological abnormalities on discharge physical examination.  Inpatient Neurology consultant, Dr. Thad Ranger, evaluated her headache and back pain on August 01 and felt that she was not experiencing spinal headache.  On admission, morphine and Celebrex were given for pain control. Morphine was changed to p.r.n. Dilaudid IV in the morning of admission, IV Dilaudid was changed to p.o. oxycodone on August 02.  Her home Topamax was also continued during hospitalization.  Her pain responded somewhat to narcotic therapy but not entirely.  She was prescribed a short course of oxycodone and discharged the last until her follow-up with her primary care provider.  1. Hypertension:  Ms. Maiorana has a history of hypertension.  Blood     pressure peaked to 158/89 on this admission; however, majority  of     systolic blood pressures were 161 or less and diastolic blood     pressures were 75 or less.  The patient's blood pressure     medications were now continued in the setting of starting diuretics     for pseudotumor cerebri.  Hypertension and treatment will need to     be reassessed on follow-up.  DISCHARGE PHYSICAL EXAMINATION:  VITAL SIGNS:  Temperature 98.0, blood pressure 105/68, pulse 73, respirations 20, O2 saturation 96% on room air. GENERAL:  Obese young woman resting in bed.  Appears to be in mild discomfort. HEENT:  Pupils equally round and reactive to light.  Extraocular muscles intact, no scleral icterus. CARDIAC:  Regular rate and rhythm, no rubs, murmurs or gallops. PULMONARY:  Clear to auscultation bilaterally, moving normal volumes of air. ABDOMEN:  Soft, nontender, nondistended.  Positive bowel sounds. EXTREMITIES:  Warm and well perfused, no pedal edema. NEURO:  Alert and oriented x3, cranial nerves II through XII grossly intact.  Sensation to light touch and pinprick on face as well as upper and lower extremities is now completely symmetric bilaterally.  Strength 5/5 and symmetric in upper and lower extremities as well.  DISCHARGE LABS: 1. CBC:  White blood cell 6.7, hemoglobin 12.7, hematocrit 38.1,     platelets 405.  Neutrophils 40%. 2. BMET:  Sodium 138, potassium 3.8, chloride 107, CO2 of 25, glucose     106, BUN 16, creatinine 0.80, calcium 8.5.    ______________________________ Blanca Friend, MD   ______________________________ Tilford Pillar, MD    BW/MEDQ  D:  05/11/2011  T:  05/11/2011  Job:  [id number]  Electronically Signed by Blanca Friend MD on 05/12/2011 08:21:41 AM Electronically Signed by Tilford Pillar  on 05/12/2011 11:32:56 AM

## 2011-07-07 LAB — DIFFERENTIAL
Basophils Absolute: 0
Basophils Absolute: 0
Basophils Absolute: 0
Basophils Absolute: 0
Basophils Absolute: 0
Basophils Absolute: 0
Basophils Absolute: 0
Basophils Absolute: 0.1
Basophils Absolute: 0.1
Basophils Relative: 0
Basophils Relative: 0
Basophils Relative: 1
Basophils Relative: 1
Basophils Relative: 1
Basophils Relative: 1
Basophils Relative: 1
Eosinophils Absolute: 0.4
Eosinophils Absolute: 0.4
Eosinophils Relative: 4
Eosinophils Relative: 6 — ABNORMAL HIGH
Eosinophils Relative: 7 — ABNORMAL HIGH
Eosinophils Relative: 7 — ABNORMAL HIGH
Eosinophils Relative: 8 — ABNORMAL HIGH
Lymphocytes Relative: 14
Lymphocytes Relative: 21
Lymphocytes Relative: 22
Lymphocytes Relative: 25
Lymphocytes Relative: 27
Lymphocytes Relative: 28
Lymphocytes Relative: 30
Lymphocytes Relative: 31
Lymphs Abs: 1.2
Lymphs Abs: 1.4
Lymphs Abs: 1.6
Lymphs Abs: 1.7
Lymphs Abs: 1.8
Lymphs Abs: 2.2
Lymphs Abs: 2.3
Monocytes Absolute: 0.4
Monocytes Absolute: 0.6
Monocytes Absolute: 0.6
Monocytes Absolute: 0.8
Monocytes Relative: 10
Monocytes Relative: 10
Monocytes Relative: 11
Monocytes Relative: 14 — ABNORMAL HIGH
Monocytes Relative: 9
Monocytes Relative: 9
Monocytes Relative: 9
Monocytes Relative: 9
Neutro Abs: 2.5
Neutro Abs: 2.9
Neutro Abs: 3.2
Neutro Abs: 3.8
Neutro Abs: 4.1
Neutro Abs: 4.9
Neutro Abs: 5.4
Neutro Abs: 5.4
Neutro Abs: 5.8
Neutrophils Relative %: 51
Neutrophils Relative %: 53
Neutrophils Relative %: 53
Neutrophils Relative %: 55
Neutrophils Relative %: 56
Neutrophils Relative %: 58
Neutrophils Relative %: 62
Neutrophils Relative %: 63
Neutrophils Relative %: 66
Neutrophils Relative %: 66
Neutrophils Relative %: 69

## 2011-07-07 LAB — CSF CELL COUNT WITH DIFFERENTIAL: WBC, CSF: 2

## 2011-07-07 LAB — CBC
HCT: 32.7 — ABNORMAL LOW
HCT: 33.4 — ABNORMAL LOW
HCT: 35 — ABNORMAL LOW
HCT: 36.5
Hemoglobin: 10.4 — ABNORMAL LOW
Hemoglobin: 11.1 — ABNORMAL LOW
Hemoglobin: 11.8 — ABNORMAL LOW
Hemoglobin: 11.9 — ABNORMAL LOW
Hemoglobin: 12.1
MCHC: 34.1
MCHC: 34.3
MCHC: 34.3
MCHC: 34.4
MCHC: 34.4
MCHC: 34.5
MCHC: 34.9
MCHC: 36.1 — ABNORMAL HIGH
MCV: 88.2
MCV: 88.3
MCV: 88.5
MCV: 88.7
Platelets: 354
Platelets: 384
Platelets: 400
Platelets: 423 — ABNORMAL HIGH
Platelets: 429 — ABNORMAL HIGH
Platelets: 430 — ABNORMAL HIGH
Platelets: 472 — ABNORMAL HIGH
RBC: 3.42 — ABNORMAL LOW
RBC: 3.6 — ABNORMAL LOW
RBC: 3.65 — ABNORMAL LOW
RBC: 3.65 — ABNORMAL LOW
RBC: 3.82 — ABNORMAL LOW
RBC: 3.96
RDW: 13.6
RDW: 13.8
RDW: 13.9
RDW: 14.1
RDW: 14.2
RDW: 14.4
RDW: 14.6
WBC: 4.8
WBC: 5.4
WBC: 7.3
WBC: 8.1
WBC: 8.7

## 2011-07-07 LAB — PROTIME-INR
INR: 1
INR: 1
INR: 1
INR: 1
INR: 1.1
INR: 1.1
INR: 1.1
INR: 1.1
INR: 1.2
INR: 1.2
INR: 1.2
INR: 1.3
INR: 1.3
INR: 1.4
INR: 1.6 — ABNORMAL HIGH
Prothrombin Time: 13.5
Prothrombin Time: 13.8
Prothrombin Time: 13.9
Prothrombin Time: 13.9
Prothrombin Time: 14.6
Prothrombin Time: 14.9
Prothrombin Time: 15.7 — ABNORMAL HIGH
Prothrombin Time: 15.9 — ABNORMAL HIGH
Prothrombin Time: 16.4 — ABNORMAL HIGH
Prothrombin Time: 17.9 — ABNORMAL HIGH

## 2011-07-07 LAB — BETA-2-GLYCOPROTEIN I ABS, IGG/M/A
Beta-2 Glyco I IgG: 69 U/mL (ref <20)
Beta-2-Glycoprotein I IgA: <4 U/mL (ref <10)
Beta-2-Glycoprotein I IgM: <4 U/mL (ref <10)

## 2011-07-07 LAB — BASIC METABOLIC PANEL
BUN: 4 — ABNORMAL LOW
BUN: 6
BUN: 6
CO2: 21
CO2: 28
Calcium: 8.2 — ABNORMAL LOW
Chloride: 105
Chloride: 107
Chloride: 111
Creatinine, Ser: 0.6
Creatinine, Ser: 0.75
Creatinine, Ser: 0.77
GFR calc Af Amer: >60
GFR calc Af Amer: >60
Glucose, Bld: 111 — ABNORMAL HIGH
Potassium: 4
Potassium: 4
Sodium: 138

## 2011-07-07 LAB — CARDIAC PANEL(CRET KIN+CKTOT+MB+TROPI)
CK, MB: 0.7
CK, MB: 0.7
CK, MB: 0.7
CK, MB: 0.7
CK, MB: 0.7
Relative Index: INVALID
Total CK: 43
Total CK: 53
Troponin I: 0.01
Troponin I: 0.02
Troponin I: 0.02
Troponin I: 0.02
Troponin I: 0.03

## 2011-07-07 LAB — COMPREHENSIVE METABOLIC PANEL
BUN: 12
CO2: 29
Calcium: 9.3
Creatinine, Ser: 0.71
GFR calc non Af Amer: >60
Glucose, Bld: 106 — ABNORMAL HIGH
Total Protein: 7.1

## 2011-07-07 LAB — CULTURE, BLOOD (ROUTINE X 2)
Culture: NO GROWTH
Culture: NO GROWTH
Report Status: 10172009
Report Status: 10172009
Report Status: 10172009

## 2011-07-07 LAB — LUPUS ANTICOAGULANT PANEL: PTT Lupus Anticoagulant: 50.1 — ABNORMAL HIGH (ref 36.3–48.8)

## 2011-07-07 LAB — ANTITHROMBIN III: AntiThromb III Func: 131 — ABNORMAL HIGH (ref 76–126)

## 2011-07-07 LAB — CARDIOLIPIN ANTIBODIES, IGG, IGM, IGA: Anticardiolipin IgA: 11 — ABNORMAL LOW (ref <13)

## 2011-07-07 LAB — PROTEIN S, TOTAL: Protein S Ag, Total: 98 % (ref 70–140)

## 2011-07-07 LAB — PROTHROMBIN GENE MUTATION

## 2011-07-07 LAB — APTT: aPTT: 39 — ABNORMAL HIGH

## 2011-07-15 LAB — OVA AND PARASITE EXAMINATION: Ova and parasites: NONE SEEN

## 2011-07-15 LAB — STOOL CULTURE

## 2011-07-15 LAB — CLOSTRIDIUM DIFFICILE EIA

## 2011-09-02 ENCOUNTER — Encounter (HOSPITAL_COMMUNITY): Payer: Self-pay | Admitting: Emergency Medicine

## 2011-09-02 ENCOUNTER — Emergency Department (HOSPITAL_COMMUNITY): Payer: Medicaid Other

## 2011-09-02 ENCOUNTER — Emergency Department (HOSPITAL_COMMUNITY)
Admission: EM | Admit: 2011-09-02 | Discharge: 2011-09-02 | Disposition: A | Discharge status: Home / Self Care | Payer: Medicaid Other | Attending: Emergency Medicine | Admitting: Emergency Medicine

## 2011-09-02 DIAGNOSIS — J45901 Unspecified asthma with (acute) exacerbation: Secondary | ICD-10-CM | POA: Insufficient documentation

## 2011-09-02 MED ORDER — PREDNISONE 50 MG PO TABS: 50.0000 mg | ORAL_TABLET | Freq: Every day | ORAL | Status: AC

## 2011-09-02 MED ORDER — SODIUM CHLORIDE 0.9 % IN NEBU
INHALATION_SOLUTION | RESPIRATORY_TRACT | Status: AC
  Administered 2011-09-02: 3 mL
  Filled 2011-09-02: qty 3

## 2011-09-02 MED ORDER — ALBUTEROL SULFATE (5 MG/ML) 0.5% IN NEBU
5.0000 mg | INHALATION_SOLUTION | Freq: Once | RESPIRATORY_TRACT | Status: AC
  Administered 2011-09-02: 5 mg via RESPIRATORY_TRACT
  Filled 2011-09-02: qty 1

## 2011-09-02 MED ORDER — PREDNISONE 20 MG PO TABS
60.0000 mg | ORAL_TABLET | Freq: Once | ORAL | Status: AC
  Administered 2011-09-02: 60 mg via ORAL
  Filled 2011-09-02: qty 3

## 2011-09-02 NOTE — ED Provider Notes (Signed)
Medical screening examination/treatment/procedure(s) were performed by non-physician practitioner and as supervising physician I was immediately available for consultation/collaboration.   Celene Kras, MD 09/02/11 [phone]

## 2011-09-02 NOTE — ED Notes (Signed)
PA in with patient at this time.

## 2011-09-02 NOTE — ED Provider Notes (Signed)
History     CSN: [id number] Arrival date & time: 09/02/2011  8:09 AM   First MD Initiated Contact with Patient 09/02/11 [phone]      Chief Complaint  Patient presents with  . Asthma  . Shortness of Breath  . Fatigue    (Consider location/radiation/quality/duration/timing/severity/associated sxs/prior treatment) Patient is a 35 y.o. female presenting with asthma and shortness of breath. The history is provided by the patient. No language interpreter was used.  Asthma This is a new problem. Episode onset: 3 days ago. The problem occurs constantly. The problem has been gradually worsening. Associated symptoms include coughing, fatigue, a fever, myalgias and weakness. Pertinent negatives include no chest pain. The symptoms are aggravated by exertion and coughing. She has tried nothing for the symptoms.  Shortness of Breath  Associated symptoms include a fever, cough, shortness of breath and wheezing. Pertinent negatives include no chest pain and no stridor. Her past medical history is significant for asthma.    Past Medical History  Diagnosis Date  . Pseudotumor cerebri   . Asthma   . GERD (gastroesophageal reflux disease)     Past Surgical History  Procedure Date  . Cholecystectomy   . Achilles tendon lengthening   . Abdominal hysterectomy     Family History  Problem Relation Age of Onset  . Migraines Mother   . Cancer Maternal Uncle   . Cancer Maternal Uncle   . Cancer Paternal Grandmother   . Cancer Paternal Grandfather     History  Substance Use Topics  . Smoking status: Never Smoker   . Smokeless tobacco: Never Used  . Alcohol Use: No    OB History    Grav Para Term Preterm Abortions TAB SAB Ect Mult Living   3 3 3       3       Review of Systems  Constitutional: Positive for fever and fatigue.  Respiratory: Positive for cough, shortness of breath and wheezing. Negative for stridor.   Cardiovascular: Negative for chest pain and leg swelling.    Musculoskeletal: Positive for myalgias.  Neurological: Positive for weakness.    Allergies  Review of patient's allergies indicates no known allergies.  Home Medications  No current outpatient prescriptions on file.  BP 163/114  Pulse 82  Temp(Src) 98.2 F (36.8 C) (Oral)  Resp 26  Ht 5\' 10"  (1.778 m)  Wt 240 lb (108.863 kg)  BMI 34.44 kg/m2  SpO2 100%  Physical Exam  Nursing note and vitals reviewed. Constitutional: She is oriented to person, place, and time. She appears well-developed and well-nourished. She is cooperative.  Non-toxic appearance. She does not have a sickly appearance. She does not appear ill. No distress.  HENT:  Head: Normocephalic and atraumatic.  Eyes: EOM are normal.  Neck: Normal range of motion.  Cardiovascular: Normal rate, regular rhythm and normal heart sounds.   Pulmonary/Chest: No accessory muscle usage. Tachypnea noted. No respiratory distress. She has no decreased breath sounds. She has wheezes in the right upper field, the right middle field, the right lower field, the left upper field, the left middle field and the left lower field. She has no rhonchi. She has no rales. She exhibits no tenderness.  Abdominal: Soft. She exhibits no distension. There is no tenderness.  Musculoskeletal: Normal range of motion.  Neurological: She is alert and oriented to person, place, and time.  Skin: Skin is warm and dry. She is not diaphoretic.  Psychiatric: She has a normal mood and affect. Judgment normal.  ED Course  Procedures (including critical care time)  Labs Reviewed - No data to display No results found.   No diagnosis found.    MDM  9:54 AM Pt states she feels generally fatigued but is breathing much easier.  She will call dr. Renard Matter for a follow up appt.        Worthy Rancher, [place] 09/02/11 [phone]

## 2011-09-02 NOTE — ED Notes (Signed)
Patient c/o shortness of breath, cough, fevers, and fatigue x3 days. Patient reports shortness of breath getting progressively worse. Patient reports using inhaler with no relief.

## 2011-09-02 NOTE — ED Notes (Signed)
Paged resp X2 for breathing treatment.

## 2011-09-02 NOTE — ED Notes (Signed)
Pt complaining of generalized weakness for three days and increased SOB with no relief using inhalers at home.

## 2011-09-17 ENCOUNTER — Encounter (HOSPITAL_COMMUNITY): Payer: Medicaid Other

## 2011-09-18 ENCOUNTER — Ambulatory Visit (HOSPITAL_COMMUNITY): Payer: Medicaid Other | Attending: Pulmonary Disease

## 2011-12-09 ENCOUNTER — Encounter (HOSPITAL_COMMUNITY): Payer: Self-pay | Admitting: Anesthesiology

## 2011-12-09 ENCOUNTER — Encounter (HOSPITAL_COMMUNITY): Payer: Self-pay

## 2011-12-09 ENCOUNTER — Ambulatory Visit (HOSPITAL_COMMUNITY): Payer: Medicaid Other | Attending: Anesthesiology

## 2011-12-09 DIAGNOSIS — R51 Headache: Secondary | ICD-10-CM | POA: Insufficient documentation

## 2011-12-09 DIAGNOSIS — Z9889 Other specified postprocedural states: Secondary | ICD-10-CM | POA: Insufficient documentation

## 2011-12-09 MED ORDER — OXYCODONE-ACETAMINOPHEN 5-325 MG PO TABS
1.0000 | ORAL_TABLET | Freq: Once | ORAL | Status: AC
  Administered 2011-12-09: 1 via ORAL

## 2011-12-09 MED ORDER — OXYCODONE-ACETAMINOPHEN 5-325 MG PO TABS
ORAL_TABLET | ORAL | Status: AC
  Administered 2011-12-09: 1 via ORAL
  Filled 2011-12-09: qty 1

## 2011-12-09 NOTE — Patient Instructions (Addendum)
Epidural Blood Patching in Spinal Headache An epidural blood patch (EBP) is a procedure used to treat a headache you may get following a spinal tap (epidural). This kind of headache is often called a spinal headache. This headache may also be called a postdural puncture headache (PDPH). An epidural blood patch involves injecting a small amount of your own blood into the space near the epidural puncture site. An epidural blood patch is generally used for treating a person who has received epidural anesthesia and who has symptoms that may include:  A headache that is so debilitating that it interferes with your ability to care for your baby.   A headache that has not been relieved by 2 to 3 days of:   Bed rest.   Drinking lots of fluids, including caffeinated drinks.   Taking oral medications for pain, including nonsteroidal anti-inflammatory agents. Only take over-the-counter or prescription medicines for pain, discomfort, or fever as directed by your caregiver.   Neck pain and stiffness that is very severe and associated with vomiting.   Hearing loss.   Double vision.  An epidural blood patch is not used when:  Your symptoms are due to an infection in the lower back (lumbar) area or the blood.   You have bleeding tendencies.   You are taking certain blood thinning medications.  PROCEDURE  Blood is withdrawn from the arm and injected into the space where the cerebrospinal fluid (CSF) is thought to be leaking. This is the same spot where the epidural anesthetic was injected. When the blood is injected, generally there will be a feeling of tightness in the buttocks, lower back, or thighs. AFTER THE PROCEDURE  You will be expected to lie on your back for 2 to 4 hours with some pillows under your knees. It is important to lie still while on your back so that a good clot can form. You should avoid any straining, especially right after the procedure. HOME CARE INSTRUCTIONS   Do not carry  anything heavier than 15 lb (7 kg) for 2 to 3 weeks.   Get as much bed rest as you can for the first 24 hours after the blood patch.   Squat rather than bend when picking up items in a low position.   Avoid excessive straining. This can cause the patch to blow out and the spinal headache to return.   Continue to drink as much as you can, especially beverages with caffeine in them.   Report any temperature, back pain, or pain radiating down the legs. Report to your caregiver if the headache returns or if you have any difficulty with your bowels or bladder. Also report any other problems not controlled by medications.  Most patients obtain almost instant relief from the spinal headache. In some, the relief comes on gradually over a 24-hour period. Following the EBP some people experience mild backache for a few days. Less than 2% of people also have a mild, passing sensation of prickly or tingly skin (paraesthesiae), neck pain, or nerve-root pain. MAKE SURE YOU:   Understand these instructions.   Will watch your condition.   Will get help right away if you are not doing well or get worse.  Call [facility] if you have continued problems [phone]

## 2011-12-09 NOTE — Progress Notes (Signed)
Pt states pain 10/10 but talking constantly with family, moving head freely while talking.

## 2011-12-09 NOTE — Progress Notes (Signed)
Pt states she already had appt. With Dr Gerilyn Pilgrim. Discharge instructions explained to pt and family. Up slowly. To car via wheelchair. Dr Jayme Cloud aware of current pain level.  Referred pt to Dr Gerilyn Pilgrim.

## 2011-12-09 NOTE — Anesthesia Procedure Notes (Addendum)
Epidural Patient location during procedure: post-op Start time: 12/09/2011 9:12 AM End time: 12/09/2011 9:26 AM  Staffing Anesthesiologist: Laurene Footman Performed by: anesthesiologist   Preanesthetic Checklist Completed: patient identified, site marked, surgical consent, pre-op evaluation, timeout performed, risks and benefits discussed and monitors and equipment checked  Epidural Patient position: sitting Prep: Betadine Patient monitoring: blood pressure and continuous pulse ox Approach: midline Injection technique: LOR saline  Needle:  Needle type: Tuohy  Needle gauge: 17 G Needle length: 9 cm Needle insertion depth: 8 cm  Additional Notes Epidural space verified with loss of resistance technique. 14cc of autologous blood instilled. Pt tolerated procedure very well. Will remain in recumbent position for 2 hrs and then d/c home for further bedrest. Post procedure epidural blood patch instruction sheet explained to patient and provided with a written copy.

## 2011-12-09 NOTE — OR Nursing (Signed)
Post op instructions reviewed with patient verbalized understanding. Report called to Lonn [place], RN to short stay in stable condition.

## 2011-12-09 NOTE — Anesthesia Preprocedure Evaluation (Signed)
Anesthesia Evaluation    Airway       Dental   Pulmonary asthma ,    Pulmonary exam normal       Cardiovascular negative cardio ROS      Neuro/Psych Pseudotumor cerebri hx. Recent LP by neurologist with post PL headache.    GI/Hepatic GERD-  Medicated,  Endo/Other    Renal/GU      Musculoskeletal   Abdominal   Peds  Hematology   Anesthesia Other Findings Positional headache of moderate - severe intensity. Unresponsive to conservative medical therapy. Plan for epidural blood patch to resolve this problem.  Reproductive/Obstetrics                           Anesthesia Physical Anesthesia Plan  ASA: II  Anesthesia Plan:    Post-op Pain Management:    Induction:   Airway Management Planned:   Additional Equipment:   Intra-op Plan:   Post-operative Plan:   Informed Consent:   Plan Discussed with:   Anesthesia Plan Comments:         Anesthesia Quick Evaluation

## 2011-12-09 NOTE — Progress Notes (Signed)
Dr. Gonzalez in to see pt. 

## 2012-03-09 ENCOUNTER — Encounter (HOSPITAL_COMMUNITY): Payer: Self-pay | Admitting: Emergency Medicine

## 2012-03-09 ENCOUNTER — Emergency Department (HOSPITAL_COMMUNITY)
Admission: EM | Admit: 2012-03-09 | Discharge: 2012-03-09 | Disposition: A | Discharge status: Home / Self Care | Payer: Medicaid Other | Attending: Emergency Medicine | Admitting: Emergency Medicine

## 2012-03-09 DIAGNOSIS — J45909 Unspecified asthma, uncomplicated: Secondary | ICD-10-CM | POA: Insufficient documentation

## 2012-03-09 DIAGNOSIS — R197 Diarrhea, unspecified: Secondary | ICD-10-CM | POA: Insufficient documentation

## 2012-03-09 DIAGNOSIS — G932 Benign intracranial hypertension: Secondary | ICD-10-CM | POA: Insufficient documentation

## 2012-03-09 DIAGNOSIS — K219 Gastro-esophageal reflux disease without esophagitis: Secondary | ICD-10-CM | POA: Insufficient documentation

## 2012-03-09 DIAGNOSIS — R112 Nausea with vomiting, unspecified: Secondary | ICD-10-CM | POA: Insufficient documentation

## 2012-03-09 DIAGNOSIS — R509 Fever, unspecified: Secondary | ICD-10-CM | POA: Insufficient documentation

## 2012-03-09 DIAGNOSIS — R109 Unspecified abdominal pain: Secondary | ICD-10-CM | POA: Insufficient documentation

## 2012-03-09 LAB — URINALYSIS, ROUTINE W REFLEX MICROSCOPIC
Nitrite: NEGATIVE
Specific Gravity, Urine: 1.018 (ref 1.005–1.030)
Urobilinogen, UA: 0.2 mg/dL (ref 0.0–1.0)

## 2012-03-09 LAB — DIFFERENTIAL
Basophils Absolute: 0 10*3/uL (ref 0.0–0.1)
Basophils Relative: 0 % (ref 0–1)
Monocytes Relative: 8 % (ref 3–12)
Neutro Abs: 4.3 10*3/uL (ref 1.7–7.7)
Neutrophils Relative %: 57 % (ref 43–77)

## 2012-03-09 LAB — COMPREHENSIVE METABOLIC PANEL
Alkaline Phosphatase: 59 U/L (ref 39–117)
BUN: 10 mg/dL (ref 6–23)
Calcium: 9.2 mg/dL (ref 8.4–10.5)
Creatinine, Ser: 0.82 mg/dL (ref 0.50–1.10)
GFR calc Af Amer: >90 mL/min (ref >90)
Glucose, Bld: 81 mg/dL (ref 70–99)
Potassium: 3.4 mEq/L — ABNORMAL LOW (ref 3.5–5.1)
Total Protein: 8.4 g/dL — ABNORMAL HIGH (ref 6.0–8.3)

## 2012-03-09 LAB — LIPASE, BLOOD: Lipase: 40 U/L (ref 11–59)

## 2012-03-09 LAB — CBC
Hemoglobin: 13.5 g/dL (ref 12.0–15.0)
MCHC: 34.2 g/dL (ref 30.0–36.0)
WBC: 7.5 10*3/uL (ref 4.0–10.5)

## 2012-03-09 MED ORDER — ONDANSETRON 8 MG PO TBDP: 8.0000 mg | ORAL_TABLET | Freq: Three times a day (TID) | ORAL | Status: AC | PRN

## 2012-03-09 MED ORDER — ONDANSETRON 4 MG PO TBDP
8.0000 mg | ORAL_TABLET | Freq: Once | ORAL | Status: AC
  Administered 2012-03-09: 8 mg via ORAL
  Filled 2012-03-09: qty 2

## 2012-03-09 NOTE — ED Provider Notes (Signed)
History     CSN: [id number]  Arrival date & time 03/09/12  1706   First MD Initiated Contact with Patient 03/09/12 1922      Chief Complaint  Patient presents with  . Emesis  . Abdominal Pain    (Consider location/radiation/quality/duration/timing/severity/associated sxs/prior treatment) Patient is a 36 y.o. female presenting with vomiting and abdominal pain. The history is provided by the patient.  Emesis  This is a new problem. Associated symptoms include abdominal pain, chills, diarrhea and a fever. Pertinent negatives include no headaches.  Abdominal Pain The primary symptoms of the illness include abdominal pain, fever, nausea, vomiting and diarrhea. The primary symptoms of the illness do not include shortness of breath or dysuria.  Additional symptoms associated with the illness include chills. Symptoms associated with the illness do not include constipation or back pain.   patient's had nausea vomiting with some diarrhea for the last 2 days. No dysuria. She states she also has had crampy abdominal pain. She states that the pain was severe it is improved somewhat. She states everything she ate came right back up. She states she's had fevers and chills. No headaches. No weakness. No clear sick contacts. She's had a previous hysterectomy. She's also had a cholecystectomy.  Past Medical History  Diagnosis Date  . Pseudotumor cerebri   . Asthma   . GERD (gastroesophageal reflux disease)     Past Surgical History  Procedure Date  . Cholecystectomy   . Achilles tendon lengthening   . Abdominal hysterectomy   . Lumbar puncture 12/04/2011    dr. Gerilyn Pilgrim    Family History  Problem Relation Age of Onset  . Migraines Mother   . Cancer Maternal Uncle   . Cancer Maternal Uncle   . Cancer Paternal Grandmother   . Cancer Paternal Grandfather     History  Substance Use Topics  . Smoking status: Never Smoker   . Smokeless tobacco: Never Used  . Alcohol Use: No    OB  History    Grav Para Term Preterm Abortions TAB SAB Ect Mult Living   3 3 3       3       Review of Systems  Constitutional: Positive for fever and chills. Negative for activity change and appetite change.  HENT: Negative for neck stiffness.   Eyes: Negative for pain.  Respiratory: Negative for chest tightness and shortness of breath.   Cardiovascular: Negative for chest pain and leg swelling.  Gastrointestinal: Positive for nausea, vomiting, abdominal pain and diarrhea. Negative for constipation, blood in stool, abdominal distention, anal bleeding and rectal pain.  Genitourinary: Negative for dysuria and flank pain.  Musculoskeletal: Negative for back pain.  Skin: Negative for rash.  Neurological: Negative for weakness, numbness and headaches.  Psychiatric/Behavioral: Negative for behavioral problems.    Allergies  Review of patient's allergies indicates no known allergies.  Home Medications   Current Outpatient Rx  Name Route Sig Dispense Refill  . ALBUTEROL SULFATE HFA 108 (90 BASE) MCG/ACT IN AERS Inhalation Inhale 2 puffs into the lungs daily as needed. Shortness of breath     . ALPRAZOLAM 1 MG PO TABS Oral Take 1 mg by mouth daily as needed. Anxiety     . BUDESONIDE-FORMOTEROL FUMARATE 80-4.5 MCG/ACT IN AERO Inhalation Inhale 2 puffs into the lungs 2 (two) times daily.      Marland Kitchen HYDROCODONE-ACETAMINOPHEN 10-650 MG PO TABS Oral Take 1 tablet by mouth every 6 (six) hours as needed. Pain     .  ONDANSETRON 8 MG PO TBDP Oral Take 1 tablet (8 mg total) by mouth every 8 (eight) hours as needed for nausea. 20 tablet 0    BP 109/63  Pulse 65  Temp(Src) 98.3 F (36.8 C) (Oral)  Resp 18  Ht 5\' 10"  (1.778 m)  Wt 240 lb (108.863 kg)  BMI 34.44 kg/m2  SpO2 100%  LMP 11/11/2011  Physical Exam  Nursing note and vitals reviewed. Constitutional: She is oriented to person, place, and time. She appears well-developed and well-nourished.  HENT:  Head: Normocephalic and atraumatic.    Eyes: EOM are normal. Pupils are equal, round, and reactive to light.  Neck: Normal range of motion. Neck supple.  Cardiovascular: Normal rate, regular rhythm and normal heart sounds.   No murmur heard. Pulmonary/Chest: Effort normal and breath sounds normal. No respiratory distress. She has no wheezes. She has no rales.  Abdominal: Soft. Bowel sounds are normal. She exhibits no distension. There is tenderness. There is no rebound and no guarding.       Minimal abdominal tenderness without rebound or guarding.  Musculoskeletal: Normal range of motion.  Neurological: She is alert and oriented to person, place, and time. No cranial nerve deficit.  Skin: Skin is warm and dry.  Psychiatric: She has a normal mood and affect. Her speech is normal.    ED Course  Procedures (including critical care time)  Labs Reviewed  COMPREHENSIVE METABOLIC PANEL - Abnormal; Notable for the following:    Potassium 3.4 (*)    Total Protein 8.4 (*)    Total Bilirubin 0.2 (*)    All other components within normal limits  LIPASE, BLOOD  CBC  DIFFERENTIAL  URINALYSIS, ROUTINE W REFLEX MICROSCOPIC  LAB REPORT - SCANNED   No results found.   1. Nausea vomiting and diarrhea       MDM  NVD. Labs reassuring. Tolerating orals. Benign exam. Will d/c.         Juliet Rude. Rubin Payor, MD 03/10/12 1345

## 2012-03-09 NOTE — Discharge Instructions (Signed)
Diet for Diarrhea, Adult Having frequent, runny stools (diarrhea) has many causes. Diarrhea may be caused or worsened by food or drink. Diarrhea may be relieved by changing your diet. IF YOU ARE NOT TOLERATING SOLID FOODS:  Drink enough water and fluids to keep your urine clear or pale yellow.   Avoid sugary drinks and sodas as well as milk-based beverages.   Avoid beverages containing caffeine and alcohol.   You may try rehydrating beverages. You can make your own by following this recipe:    tsp table salt.    tsp baking soda.   ? tsp salt substitute (potassium chloride).   1 tbs + 1 tsp sugar.   1 qt water.  As your stools become more solid, you can start eating solid foods. Add foods one at a time. If a certain food causes your diarrhea to get worse, avoid that food and try other foods. A low fiber, low-fat, and lactose-free diet is recommended. Small, frequent meals may be better tolerated.  Starches  Allowed:  White, French, and pita breads, plain rolls, buns, bagels. Plain muffins, matzo. Soda, saltine, or graham crackers. Pretzels, melba toast, zwieback. Cooked cereals made with water: cornmeal, farina, cream cereals. Dry cereals: refined corn, wheat, rice. Potatoes prepared any way without skins, refined macaroni, spaghetti, noodles, refined rice.   Avoid:  Bread, rolls, or crackers made with whole wheat, multi-grains, rye, bran seeds, nuts, or coconut. Corn tortillas or taco shells. Cereals containing whole grains, multi-grains, bran, coconut, nuts, or raisins. Cooked or dry oatmeal. Coarse wheat cereals, granola. Cereals advertised as "high-fiber." Potato skins. Whole grain pasta, wild or brown rice. Popcorn. Sweet potatoes/yams. Sweet rolls, doughnuts, waffles, pancakes, sweet breads.  Vegetables  Allowed: Strained tomato and vegetable juices. Most well-cooked and canned vegetables without seeds. Fresh: Tender lettuce, cucumber without the skin, cabbage, spinach, bean  sprouts.   Avoid: Fresh, cooked, or canned: Artichokes, baked beans, beet greens, broccoli, Brussels sprouts, corn, kale, legumes, peas, sweet potatoes. Cooked: Green or red cabbage, spinach. Avoid large servings of any vegetables, because vegetables shrink when cooked, and they contain more fiber per serving than fresh vegetables.  Fruit  Allowed: All fruit juices except prune juice. Cooked or canned: Apricots, applesauce, cantaloupe, cherries, fruit cocktail, grapefruit, grapes, kiwi, mandarin oranges, peaches, pears, plums, watermelon. Fresh: Apples without skin, ripe banana, grapes, cantaloupe, cherries, grapefruit, peaches, oranges, plums. Keep servings limited to  cup or 1 piece.   Avoid: Fresh: Apple with skin, apricots, mango, pears, raspberries, strawberries. Prune juice, stewed or dried prunes. Dried fruits, raisins, dates. Large servings of all fresh fruits.  Meat and Meat Substitutes  Allowed: Ground or well-cooked tender beef, ham, veal, lamb, pork, or poultry. Eggs, plain cheese. Fish, oysters, shrimp, lobster, other seafoods. Liver, organ meats.   Avoid: Tough, fibrous meats with gristle. Peanut butter, smooth or chunky. Cheese, nuts, seeds, legumes, dried peas, beans, lentils.  Milk  Allowed: Yogurt, lactose-free milk, kefir, drinkable yogurt, buttermilk, soy milk.   Avoid: Milk, chocolate milk, beverages made with milk, such as milk shakes.  Soups  Allowed: Bouillon, broth, or soups made from allowed foods. Any strained soup.   Avoid: Soups made from vegetables that are not allowed, cream or milk-based soups.  Desserts and Sweets  Allowed: Sugar-free gelatin, sugar-free frozen ice pops made without sugar alcohol.   Avoid: Plain cakes and cookies, pie made with allowed fruit, pudding, custard, cream pie. Gelatin, fruit, ice, sherbet, frozen ice pops. Ice cream, ice milk without nuts. Plain hard candy,   honey, jelly, molasses, syrup, sugar, chocolate syrup, gumdrops,  marshmallows.  Fats and Oils  Allowed: Avoid any fats and oils.   Avoid: Seeds, nuts, olives, avocados. Margarine, butter, cream, mayonnaise, salad oils, plain salad dressings made from allowed foods. Plain gravy, crisp bacon without rind.  Beverages  Allowed: Water, decaffeinated teas, oral rehydration solutions, sugar-free beverages.   Avoid: Fruit juices, caffeinated beverages (coffee, tea, soda or pop), alcohol, sports drinks, or lemon-lime soda or pop.  Condiments  Allowed: Ketchup, mustard, horseradish, vinegar, cream sauce, cheese sauce, cocoa powder. Spices in moderation: allspice, basil, bay leaves, celery powder or leaves, cinnamon, cumin powder, curry powder, ginger, mace, marjoram, onion or garlic powder, oregano, paprika, parsley flakes, ground pepper, rosemary, sage, savory, tarragon, thyme, turmeric.   Avoid: Coconut, honey.  Weight Monitoring: Weigh yourself every day. You should weigh yourself in the morning after you urinate and before you eat breakfast. Wear the same amount of clothing when you weigh yourself. Record your weight daily. Bring your recorded weights to your clinic visits. Tell your caregiver right away if you have gained 3 lb/1.4 kg or more in 1 day, 5 lb/2.3 kg in a week, or whatever amount you were told to report. SEEK IMMEDIATE MEDICAL CARE IF:   You are unable to keep fluids down.   You start to throw up (vomit) or diarrhea keeps coming back (persistent).   Abdominal pain develops, increases, or can be felt in one place (localizes).   You have an oral temperature above 102 F (38.9 C), not controlled by medicine.   Diarrhea contains blood or mucus.   You develop excessive weakness, dizziness, fainting, or extreme thirst.  MAKE SURE YOU:   Understand these instructions.   Will watch your condition.   Will get help right away if you are not doing well or get worse.  Document Released: 12/13/2003 Document Revised: 09/11/2011 Document Reviewed:  04/05/2009 [employer] Patient Information 2012 [employer].Nausea and Vomiting Nausea is a sick feeling that often comes before throwing up (vomiting). Vomiting is a reflex where stomach contents come out of your mouth. Vomiting can cause severe loss of body fluids (dehydration). Children and elderly adults can become dehydrated quickly, especially if they also have diarrhea. Nausea and vomiting are symptoms of a condition or disease. It is important to find the cause of your symptoms. CAUSES   Direct irritation of the stomach lining. This irritation can result from increased acid production (gastroesophageal reflux disease), infection, food poisoning, taking certain medicines (such as nonsteroidal anti-inflammatory drugs), alcohol use, or tobacco use.   Signals from the brain.These signals could be caused by a headache, heat exposure, an inner ear disturbance, increased pressure in the brain from injury, infection, a tumor, or a concussion, pain, emotional stimulus, or metabolic problems.   An obstruction in the gastrointestinal tract (bowel obstruction).   Illnesses such as diabetes, hepatitis, gallbladder problems, appendicitis, kidney problems, cancer, sepsis, atypical symptoms of a heart attack, or eating disorders.   Medical treatments such as chemotherapy and radiation.   Receiving medicine that makes you sleep (general anesthetic) during surgery.  DIAGNOSIS Your caregiver may ask for tests to be done if the problems do not improve after a few days. Tests may also be done if symptoms are severe or if the reason for the nausea and vomiting is not clear. Tests may include:  Urine tests.   Blood tests.   Stool tests.   Cultures (to look for evidence of infection).   X-rays or other imaging   studies.  Test results can help your caregiver make decisions about treatment or the need for additional tests. TREATMENT You need to stay well hydrated. Drink frequently but in small  amounts.You may wish to drink water, sports drinks, clear broth, or eat frozen ice pops or gelatin dessert to help stay hydrated.When you eat, eating slowly may help prevent nausea.There are also some antinausea medicines that may help prevent nausea. HOME CARE INSTRUCTIONS   Take all medicine as directed by your caregiver.   If you do not have an appetite, do not force yourself to eat. However, you must continue to drink fluids.   If you have an appetite, eat a normal diet unless your caregiver tells you differently.   Eat a variety of complex carbohydrates (rice, wheat, potatoes, bread), lean meats, yogurt, fruits, and vegetables.   Avoid high-fat foods because they are more difficult to digest.   Drink enough water and fluids to keep your urine clear or pale yellow.   If you are dehydrated, ask your caregiver for specific rehydration instructions. Signs of dehydration may include:   Severe thirst.   Dry lips and mouth.   Dizziness.   Dark urine.   Decreasing urine frequency and amount.   Confusion.   Rapid breathing or pulse.  SEEK IMMEDIATE MEDICAL CARE IF:   You have blood or brown flecks (like coffee grounds) in your vomit.   You have black or bloody stools.   You have a severe headache or stiff neck.   You are confused.   You have severe abdominal pain.   You have chest pain or trouble breathing.   You do not urinate at least once every 8 hours.   You develop cold or clammy skin.   You continue to vomit for longer than 24 to 48 hours.   You have a fever.  MAKE SURE YOU:   Understand these instructions.   Will watch your condition.   Will get help right away if you are not doing well or get worse.  Document Released: 09/22/2005 Document Revised: 09/11/2011 Document Reviewed: 02/19/2011 [employer] Patient Information 2012 [employer]. 

## 2012-03-09 NOTE — ED Notes (Signed)
Pt c/o lower abd pain with N/V/D x 2 days; pt denies vaginal discharge or UTI sx

## 2012-03-09 NOTE — ED Notes (Addendum)
Pt c/o n/v/d X 3 days. Pt reports she woke up in the middle of the night in sweat with severe stomach pain 3 days ago. Pt reports she attempted to go to work today and felt extremely weak and slightly light-headed and dizzy then had the same severe abdominal pain that brought her to tears.

## 2012-03-15 ENCOUNTER — Ambulatory Visit (HOSPITAL_COMMUNITY)
Admission: RE | Admit: 2012-03-15 | Discharge: 2012-03-15 | Disposition: A | Discharge status: Home / Self Care | Payer: Medicaid Other | Source: Ambulatory Visit | Attending: Family Medicine | Admitting: Family Medicine

## 2012-03-15 ENCOUNTER — Other Ambulatory Visit (HOSPITAL_COMMUNITY): Payer: Self-pay | Admitting: Family Medicine

## 2012-03-15 DIAGNOSIS — R0989 Other specified symptoms and signs involving the circulatory and respiratory systems: Secondary | ICD-10-CM

## 2012-03-15 DIAGNOSIS — R05 Cough: Secondary | ICD-10-CM

## 2012-03-15 DIAGNOSIS — R059 Cough, unspecified: Secondary | ICD-10-CM | POA: Insufficient documentation

## 2012-03-15 DIAGNOSIS — R52 Pain, unspecified: Secondary | ICD-10-CM

## 2012-03-15 DIAGNOSIS — R079 Chest pain, unspecified: Secondary | ICD-10-CM | POA: Insufficient documentation

## 2012-03-15 DIAGNOSIS — R0602 Shortness of breath: Secondary | ICD-10-CM | POA: Insufficient documentation

## 2012-04-20 ENCOUNTER — Emergency Department (HOSPITAL_COMMUNITY): Payer: Medicaid Other

## 2012-04-20 ENCOUNTER — Encounter (HOSPITAL_COMMUNITY): Payer: Self-pay | Admitting: *Deleted

## 2012-04-20 ENCOUNTER — Emergency Department (HOSPITAL_COMMUNITY)
Admission: EM | Admit: 2012-04-20 | Discharge: 2012-04-20 | Disposition: A | Discharge status: Home / Self Care | Payer: Medicaid Other | Attending: Emergency Medicine | Admitting: Emergency Medicine

## 2012-04-20 DIAGNOSIS — R591 Generalized enlarged lymph nodes: Secondary | ICD-10-CM

## 2012-04-20 DIAGNOSIS — J028 Acute pharyngitis due to other specified organisms: Secondary | ICD-10-CM

## 2012-04-20 DIAGNOSIS — K219 Gastro-esophageal reflux disease without esophagitis: Secondary | ICD-10-CM | POA: Insufficient documentation

## 2012-04-20 DIAGNOSIS — I10 Essential (primary) hypertension: Secondary | ICD-10-CM | POA: Insufficient documentation

## 2012-04-20 DIAGNOSIS — G932 Benign intracranial hypertension: Secondary | ICD-10-CM | POA: Insufficient documentation

## 2012-04-20 DIAGNOSIS — Z79899 Other long term (current) drug therapy: Secondary | ICD-10-CM | POA: Insufficient documentation

## 2012-04-20 DIAGNOSIS — J029 Acute pharyngitis, unspecified: Secondary | ICD-10-CM | POA: Insufficient documentation

## 2012-04-20 DIAGNOSIS — J45909 Unspecified asthma, uncomplicated: Secondary | ICD-10-CM | POA: Insufficient documentation

## 2012-04-20 DIAGNOSIS — R42 Dizziness and giddiness: Secondary | ICD-10-CM | POA: Insufficient documentation

## 2012-04-20 DIAGNOSIS — R599 Enlarged lymph nodes, unspecified: Secondary | ICD-10-CM | POA: Insufficient documentation

## 2012-04-20 DIAGNOSIS — M542 Cervicalgia: Secondary | ICD-10-CM | POA: Insufficient documentation

## 2012-04-20 HISTORY — DX: Essential (primary) hypertension: I10

## 2012-04-20 LAB — BASIC METABOLIC PANEL
CO2: 23 mEq/L (ref 19–32)
Calcium: 9 mg/dL (ref 8.4–10.5)
GFR calc non Af Amer: >90 mL/min (ref >90)
Potassium: 3.6 mEq/L (ref 3.5–5.1)
Sodium: 139 mEq/L (ref 135–145)

## 2012-04-20 LAB — RAPID STREP SCREEN (MED CTR MEBANE ONLY): Streptococcus, Group A Screen (Direct): NEGATIVE

## 2012-04-20 LAB — URINALYSIS, ROUTINE W REFLEX MICROSCOPIC
Glucose, UA: NEGATIVE mg/dL
Leukocytes, UA: NEGATIVE
Nitrite: NEGATIVE
Specific Gravity, Urine: 1.018 (ref 1.005–1.030)
pH: 5.5 (ref 5.0–8.0)

## 2012-04-20 LAB — POCT PREGNANCY, URINE: Preg Test, Ur: NEGATIVE

## 2012-04-20 LAB — CBC
Hemoglobin: 13.1 g/dL (ref 12.0–15.0)
MCV: 87 fL (ref 78.0–100.0)
Platelets: 340 10*3/uL (ref 150–400)
RBC: 4.54 MIL/uL (ref 3.87–5.11)
WBC: 6.8 10*3/uL (ref 4.0–10.5)

## 2012-04-20 MED ORDER — IBUPROFEN 200 MG PO TABS
400.0000 mg | ORAL_TABLET | Freq: Once | ORAL | Status: AC
  Administered 2012-04-20: 400 mg via ORAL
  Filled 2012-04-20: qty 2

## 2012-04-20 MED ORDER — MECLIZINE HCL 25 MG PO TABS
25.0000 mg | ORAL_TABLET | Freq: Once | ORAL | Status: AC
  Administered 2012-04-20: 25 mg via ORAL
  Filled 2012-04-20: qty 1

## 2012-04-20 MED ORDER — HYDROCODONE-ACETAMINOPHEN 5-325 MG PO TABS
1.0000 | ORAL_TABLET | Freq: Once | ORAL | Status: AC
  Administered 2012-04-20: 1 via ORAL
  Filled 2012-04-20: qty 1

## 2012-04-20 MED ORDER — OXYCODONE-ACETAMINOPHEN 5-325 MG PO TABS
1.0000 | ORAL_TABLET | Freq: Once | ORAL | Status: AC
  Administered 2012-04-20: 1 via ORAL
  Filled 2012-04-20: qty 1

## 2012-04-20 NOTE — ED Notes (Signed)
Discharged home with written and verbal instructions.  Knows to follow up with family doctor.

## 2012-04-20 NOTE — ED Notes (Signed)
Patient states "sore throat with left sided neck pain".   States "spinning in the room".  History of same several times before.

## 2012-04-20 NOTE — ED Provider Notes (Signed)
Medical screening examination/treatment/procedure(s) were conducted as a shared visit with non-physician practitioner(s) and myself.  I personally evaluated the patient during the encounter  Right Sided neck pain with difficulty swallowing for the past one day. No fever. Hx PTC without shunt. No meningismus, OP clear, shotty LAD. No headache, vision change, or papilledema.  Glynn Octave, MD 04/20/12 [phone]

## 2012-04-20 NOTE — ED Provider Notes (Signed)
History     CSN: [id number]  Arrival date & time 04/20/12  4098   First MD Initiated Contact with Patient 04/20/12 [phone]      Chief Complaint  Patient presents with  . Sore Throat    (Consider location/radiation/quality/duration/timing/severity/associated sxs/prior treatment) HPI Comments: Patient with a history of pseudotumor cerebri, and hypertension presents emergency department with the chief complaint of right-sided neck pain, sore throat, and dizziness.  Onset of symptoms began last night and are associated with pain with swallowing and movement of neck. She also reports having intermittent HAs, but is not currently having one and denies any change in vision. She denies fevers, night sweats, chills, syncope, palpitations, N/V/D, abdominal pain, ear pain, tinnitus, lightheadedness, weakness, numbness, slurred speech, or urinary symptoms.   Patient is a 36 y.o. female presenting with pharyngitis. The history is provided by the patient.  Sore Throat    Past Medical History  Diagnosis Date  . Pseudotumor cerebri   . Asthma   . GERD (gastroesophageal reflux disease)   . Hypertension     Past Surgical History  Procedure Date  . Cholecystectomy   . Achilles tendon lengthening   . Abdominal hysterectomy   . Lumbar puncture 12/04/2011    dr. Gerilyn Pilgrim    Family History  Problem Relation Age of Onset  . Migraines Mother   . Cancer Maternal Uncle   . Cancer Maternal Uncle   . Cancer Paternal Grandmother   . Cancer Paternal Grandfather     History  Substance Use Topics  . Smoking status: Never Smoker   . Smokeless tobacco: Never Used  . Alcohol Use: No    OB History    Grav Para Term Preterm Abortions TAB SAB Ect Mult Living   3 3 3       3       Review of Systems  All other systems reviewed and are negative.    Allergies  Review of patient's allergies indicates no known allergies.  Home Medications   Current Outpatient Rx  Name Route Sig Dispense Refill    . ACETAZOLAMIDE 250 MG PO TABS Oral Take 250 mg by mouth 2 (two) times daily.    . ALBUTEROL SULFATE HFA 108 (90 BASE) MCG/ACT IN AERS Inhalation Inhale 2 puffs into the lungs every 4 (four) hours as needed. For shortness of breath    . ALPRAZOLAM 1 MG PO TABS Oral Take 1 mg by mouth every 4 (four) hours.    Marland Kitchen VITAMIN C PO Oral Take 1 tablet by mouth daily.    . BUDESONIDE-FORMOTEROL FUMARATE 80-4.5 MCG/ACT IN AERO Inhalation Inhale 2 puffs into the lungs 2 (two) times daily.      Marland Kitchen FLUCONAZOLE 150 MG PO TABS Oral Take 150 mg by mouth once.    Marland Kitchen HYDROCODONE-ACETAMINOPHEN 10-650 MG PO TABS Oral Take 1 tablet by mouth every 6 (six) hours as needed. For pain    . NYSTATIN-TRIAMCINOLONE 100000-0.1 UNIT/GM-% EX CREA Topical Apply 1 application topically 2 (two) times daily.    Marland Kitchen PHENTERMINE HCL 37.5 MG PO TABS Oral Take 37.5 mg by mouth daily before breakfast.    . PRESCRIPTION MEDICATION Intramuscular Inject 1 each into the muscle every 30 (thirty) days. lipoplex injection    . TOPIRAMATE 25 MG PO TABS Oral Take 25 mg by mouth 2 (two) times daily.      BP 122/78  Pulse 70  Temp 98.6 F (37 C) (Oral)  Resp 18  SpO2 100%  LMP 11/11/2011  Physical Exam  Nursing note and vitals reviewed. Constitutional: She is oriented to person, place, and time. She appears well-developed and well-nourished. No distress.  HENT:  Head: Normocephalic and atraumatic.       No tonsillar swelling or exudate. Uvula midline. Airway intact.  Eyes: Conjunctivae and EOM are normal. Pupils are equal, round, and reactive to light. No scleral icterus.       Fundoscopic exam without evidence of papilledema   Neck: Neck supple. No JVD present. Muscular tenderness present. No spinous process tenderness present. Carotid bruit is not present. No rigidity. No erythema and normal range of motion present. No Brudzinski's sign noted.         Mild pain w ROM, no rigidity or bont ttp  Cardiovascular: Normal heart sounds and  intact distal pulses.        No carotid bruits. RRR  Pulmonary/Chest: Effort normal and breath sounds normal. No respiratory distress. She has no wheezes. She has no rales.  Musculoskeletal: Normal range of motion.  Lymphadenopathy:    She has cervical adenopathy.  Neurological: She is alert and oriented to person, place, and time. She has normal strength. No cranial nerve deficit or sensory deficit. She displays a negative Romberg sign. Coordination and gait normal. GCS eye subscore is 4. GCS verbal subscore is 5. GCS motor subscore is 6.       A&O x3.  Able to follow commands. PERRL, EOMs, no nystagmus. Shoulder shrug, facial muscles, tongue protrusion and swallow intact.  Motor strength 5/5 bilaterally including grip strength, triceps, hamstrings and ankle dorsiflexion.  intact finger to nose, shin to heel and rapid alternating movements. Pt ambulated with out ataxia  Skin: Skin is warm and dry. No rash noted. She is not diaphoretic.  Psychiatric: She has a normal mood and affect. Her behavior is normal.    ED Course  Procedures (including critical care time)   Labs Reviewed  BASIC METABOLIC PANEL  CBC  URINALYSIS, ROUTINE W REFLEX MICROSCOPIC  POCT PREGNANCY, URINE  RAPID STREP SCREEN   Ct Head Wo Contrast  04/20/2012  *RADIOLOGY REPORT*  Clinical Data:  Dizziness.  CT HEAD WITHOUT CONTRAST  Technique: Contiguous axial images were obtained from the base of the skull through the vertex without intravenous contrast.  Comparison:  05/06/2011  Findings:  No acute intracranial abnormality.  No hemorrhage, hydrocephalus, mass lesion or acute infarction.  No midline shift. No acute calvarial abnormality.  Paranasal sinuses and mastoids are clear.  Orbital soft tissues are unremarkable.  IMPRESSION: Normal study.  *RADIOLOGY REPORT*  Clinical Data:  Sore throat and  CT NECK WITHOUT CONTRAST  Technique:  Multidetector CT imaging of the neck was performed following the standard protocol without  intravenous contrast.  Comparison:   None.  Findings:  There are small and borderline sized scattered cervical lymph nodes bilaterally, presumably reactive.  Airway is patent. Epiglottis and aryepiglottic folds are normal.  Thyroid is unremarkable as are the submandibular glands and parotid glands. Visualized paranasal sinuses are clear.  Prevertebral soft tissues are normal.  Visualized lung apices clear.  No acute bony abnormality.  IMPRESSION: Small and borderline sized scattered bilateral cervical lymph nodes, presumably reactive.  Otherwise unremarkable study.  Original Report Authenticated By: Cyndie Chime, M.D.   Ct Soft Tissue Neck Wo Contrast  04/20/2012  *RADIOLOGY REPORT*  Clinical Data:  Dizziness.  CT HEAD WITHOUT CONTRAST  Technique: Contiguous axial images were obtained from the base of the skull through the vertex without intravenous contrast.  Comparison:  05/06/2011  Findings:  No acute intracranial abnormality.  No hemorrhage, hydrocephalus, mass lesion or acute infarction.  No midline shift. No acute calvarial abnormality.  Paranasal sinuses and mastoids are clear.  Orbital soft tissues are unremarkable.  IMPRESSION: Normal study.  *RADIOLOGY REPORT*  Clinical Data:  Sore throat and  CT NECK WITHOUT CONTRAST  Technique:  Multidetector CT imaging of the neck was performed following the standard protocol without intravenous contrast.  Comparison:   None.  Findings:  There are small and borderline sized scattered cervical lymph nodes bilaterally, presumably reactive.  Airway is patent. Epiglottis and aryepiglottic folds are normal.  Thyroid is unremarkable as are the submandibular glands and parotid glands. Visualized paranasal sinuses are clear.  Prevertebral soft tissues are normal.  Visualized lung apices clear.  No acute bony abnormality.  IMPRESSION: Small and borderline sized scattered bilateral cervical lymph nodes, presumably reactive.  Otherwise unremarkable study.  Original Report  Authenticated By: Cyndie Chime, M.D.     No diagnosis found.    MDM  Lymphadenopathy, sore throat, dizziness   Labs and imaging reviewed. Pt ambulated in ED with out ataxia. At this time there does not appear to be any evidence of an acute emergency medical condition and the patient appears stable for discharge with appropriate outpatient follow up. Diagnosis was discussed with patient who verbalizes understanding and is agreeable to discharge. Pt case discussed with Dr. Holli Humbles who agrees with my plan.          Jaci Carrel, [place] 04/20/12 1137

## 2012-08-16 ENCOUNTER — Emergency Department (HOSPITAL_COMMUNITY): Payer: Medicaid Other

## 2012-08-16 ENCOUNTER — Emergency Department (HOSPITAL_COMMUNITY)
Admission: EM | Admit: 2012-08-16 | Discharge: 2012-08-16 | Disposition: A | Discharge status: Home / Self Care | Payer: Medicaid Other | Attending: Emergency Medicine | Admitting: Emergency Medicine

## 2012-08-16 ENCOUNTER — Encounter (HOSPITAL_COMMUNITY): Payer: Self-pay | Admitting: Emergency Medicine

## 2012-08-16 DIAGNOSIS — Z79899 Other long term (current) drug therapy: Secondary | ICD-10-CM | POA: Insufficient documentation

## 2012-08-16 DIAGNOSIS — N83209 Unspecified ovarian cyst, unspecified side: Secondary | ICD-10-CM | POA: Insufficient documentation

## 2012-08-16 DIAGNOSIS — Z90710 Acquired absence of both cervix and uterus: Secondary | ICD-10-CM | POA: Insufficient documentation

## 2012-08-16 DIAGNOSIS — K219 Gastro-esophageal reflux disease without esophagitis: Secondary | ICD-10-CM | POA: Insufficient documentation

## 2012-08-16 DIAGNOSIS — G932 Benign intracranial hypertension: Secondary | ICD-10-CM

## 2012-08-16 DIAGNOSIS — R51 Headache: Secondary | ICD-10-CM | POA: Insufficient documentation

## 2012-08-16 DIAGNOSIS — J45909 Unspecified asthma, uncomplicated: Secondary | ICD-10-CM | POA: Insufficient documentation

## 2012-08-16 DIAGNOSIS — I10 Essential (primary) hypertension: Secondary | ICD-10-CM | POA: Insufficient documentation

## 2012-08-16 LAB — URINALYSIS, ROUTINE W REFLEX MICROSCOPIC
Glucose, UA: NEGATIVE mg/dL
Leukocytes, UA: NEGATIVE
Protein, ur: NEGATIVE mg/dL
Specific Gravity, Urine: 1.028 (ref 1.005–1.030)

## 2012-08-16 LAB — CBC WITH DIFFERENTIAL/PLATELET
Basophils Absolute: 0 10*3/uL (ref 0.0–0.1)
Eosinophils Relative: 3 % (ref 0–5)
Lymphocytes Relative: 29 % (ref 12–46)
Neutro Abs: 5.4 10*3/uL (ref 1.7–7.7)
Neutrophils Relative %: 64 % (ref 43–77)
Platelets: 335 10*3/uL (ref 150–400)
RDW: 13.3 % (ref 11.5–15.5)
WBC: 8.5 10*3/uL (ref 4.0–10.5)

## 2012-08-16 LAB — BASIC METABOLIC PANEL
CO2: 29 mEq/L (ref 19–32)
Calcium: 8.8 mg/dL (ref 8.4–10.5)
Chloride: 100 mEq/L (ref 96–112)
Potassium: 3.4 mEq/L — ABNORMAL LOW (ref 3.5–5.1)
Sodium: 138 mEq/L (ref 135–145)

## 2012-08-16 LAB — POCT PREGNANCY, URINE: Preg Test, Ur: NEGATIVE

## 2012-08-16 LAB — WET PREP, GENITAL
Trich, Wet Prep: NONE SEEN
WBC, Wet Prep HPF POC: NONE SEEN
Yeast Wet Prep HPF POC: NONE SEEN

## 2012-08-16 MED ORDER — HYDROMORPHONE HCL PF 1 MG/ML IJ SOLN
1.0000 mg | Freq: Once | INTRAMUSCULAR | Status: AC
  Administered 2012-08-16: 1 mg via INTRAVENOUS
  Filled 2012-08-16: qty 1

## 2012-08-16 MED ORDER — OXYCODONE-ACETAMINOPHEN 5-325 MG PO TABS
1.0000 | ORAL_TABLET | Freq: Once | ORAL | Status: AC
  Administered 2012-08-16: 1 via ORAL
  Filled 2012-08-16: qty 1

## 2012-08-16 MED ORDER — SODIUM CHLORIDE 0.9 % IV SOLN
Freq: Once | INTRAVENOUS | Status: AC
  Administered 2012-08-16: 500 mL via INTRAVENOUS

## 2012-08-16 MED ORDER — ONDANSETRON HCL 4 MG/2ML IJ SOLN
4.0000 mg | Freq: Once | INTRAMUSCULAR | Status: AC
  Administered 2012-08-16: 4 mg via INTRAVENOUS
  Filled 2012-08-16: qty 2

## 2012-08-16 MED ORDER — HYDROCODONE-ACETAMINOPHEN 5-500 MG PO TABS: 1.0000 | ORAL_TABLET | Freq: Four times a day (QID) | ORAL | Status: DC | PRN

## 2012-08-16 NOTE — ED Notes (Signed)
Patient transported to Ultrasound 

## 2012-08-16 NOTE — ED Notes (Signed)
Pt did not answer x 2 

## 2012-08-16 NOTE — ED Provider Notes (Signed)
History     CSN: [id number]  Arrival date & time 08/16/12  1406   First MD Initiated Contact with Patient 08/16/12 1933      Chief Complaint  Patient presents with  . Abdominal Pain  . Headache    (Consider location/radiation/quality/duration/timing/severity/associated sxs/prior treatment) Patient is a 36 y.o. female presenting with abdominal pain and headaches. The history is provided by the patient.  Abdominal Pain The primary symptoms of the illness include abdominal pain.  Headache   She comes in with 2 distinct problems. She has a history of pseudotumor cerebri and has been having a bifrontal headache with blurred vision for the last 2 weeks. This is typical of her symptoms when her CSF pressures go up. There's been no associated nausea or vomiting no dizziness or incoordination. Headache is moderate and she rates it at 6/10. Her second complaint is suprapubic pain which has been present for the last week. It is constant and worse with intercourse but nothing makes it better. She denies nausea, vomiting, diarrhea. She denies dysuria and she denies vaginal discharge. Pain is severe and she rates it at 9/10. She relates that she does have a history of ovarian cysts. She has taken over-the-counter analgesics with no relief.  Past Medical History  Diagnosis Date  . Pseudotumor cerebri   . Asthma   . GERD (gastroesophageal reflux disease)   . Hypertension     Past Surgical History  Procedure Date  . Cholecystectomy   . Achilles tendon lengthening   . Abdominal hysterectomy   . Lumbar puncture 12/04/2011    dr. Gerilyn Pilgrim    Family History  Problem Relation Age of Onset  . Migraines Mother   . Cancer Maternal Uncle   . Cancer Maternal Uncle   . Cancer Paternal Grandmother   . Cancer Paternal Grandfather     History  Substance Use Topics  . Smoking status: Never Smoker   . Smokeless tobacco: Never Used  . Alcohol Use: No    OB History    Grav Para Term Preterm  Abortions TAB SAB Ect Mult Living   3 3 3       3       Review of Systems  Gastrointestinal: Positive for abdominal pain.  Neurological: Positive for headaches.  All other systems reviewed and are negative.    Allergies  Review of patient's allergies indicates no known allergies.  Home Medications   Current Outpatient Rx  Name  Route  Sig  Dispense  Refill  . ACETAZOLAMIDE 250 MG PO TABS   Oral   Take 250 mg by mouth 2 (two) times daily.         . ALBUTEROL SULFATE HFA 108 (90 BASE) MCG/ACT IN AERS   Inhalation   Inhale 2 puffs into the lungs every 4 (four) hours as needed. For shortness of breath         . ALPRAZOLAM 1 MG PO TABS   Oral   Take 1 mg by mouth 4 (four) times daily as needed. For anxiety         . BUDESONIDE-FORMOTEROL FUMARATE 80-4.5 MCG/ACT IN AERO   Inhalation   Inhale 2 puffs into the lungs 2 (two) times daily.           Marland Kitchen HYDROCODONE-ACETAMINOPHEN 10-650 MG PO TABS   Oral   Take 1 tablet by mouth every 6 (six) hours as needed. For pain         . PHENTERMINE HCL 37.5 MG PO TABS  Oral   Take 37.5 mg by mouth daily before breakfast.         . PRESCRIPTION MEDICATION   Intramuscular   Inject 1 each into the muscle every 30 (thirty) days. lipoplex injection         . TOPIRAMATE 25 MG PO TABS   Oral   Take 25 mg by mouth 2 (two) times daily.           BP 162/94  Pulse 73  Temp 97.7 F (36.5 C) (Oral)  Resp 18  SpO2 100%  LMP 11/11/2011  Physical Exam  Nursing note and vitals reviewed. 36 year old female, resting comfortably and in no acute distress. Vital signs are significant for hypertension with blood pressure 162/94. Oxygen saturation is 100%, which is normal. Head is normocephalic and atraumatic. PERRLA, EOMI. Oropharynx is clear. Fundi show no hemorrhage, exudate, or papilledema. Neck is nontender and supple without adenopathy or JVD. Back is nontender and there is no CVA tenderness. Lungs are clear without rales,  wheezes, or rhonchi. Chest is nontender. Heart has regular rate and rhythm without murmur. Abdomen is soft, flat, without masses or hepatosplenomegaly and peristalsis is normoactive. There is moderate tenderness across the suprapubic area with maximum tenderness in the left suprapubic area. Extremities have no cyanosis or edema, full range of motion is present. Skin is warm and dry without rash. Neurologic: Mental status is normal, cranial nerves are intact, there are no motor or sensory deficits.   ED Course  Procedures (including critical care time)  Results for orders placed during the hospital encounter of 08/16/12  CBC WITH DIFFERENTIAL      Component Value Range   WBC 8.5  4.0 - 10.5 K/uL   RBC 4.44  3.87 - 5.11 MIL/uL   Hemoglobin 13.2  12.0 - 15.0 g/dL   HCT 16.1  09.6 - 04.5 %   MCV 85.6  78.0 - 100.0 fL   MCH 29.7  26.0 - 34.0 pg   MCHC 34.7  30.0 - 36.0 g/dL   RDW 40.9  81.1 - 91.4 %   Platelets 335  150 - 400 K/uL   Neutrophils Relative 64  43 - 77 %   Neutro Abs 5.4  1.7 - 7.7 K/uL   Lymphocytes Relative 29  12 - 46 %   Lymphs Abs 2.5  0.7 - 4.0 K/uL   Monocytes Relative 4  3 - 12 %   Monocytes Absolute 0.3  0.1 - 1.0 K/uL   Eosinophils Relative 3  0 - 5 %   Eosinophils Absolute 0.3  0.0 - 0.7 K/uL   Basophils Relative 0  0 - 1 %   Basophils Absolute 0.0  0.0 - 0.1 K/uL  BASIC METABOLIC PANEL      Component Value Range   Sodium 138  135 - 145 mEq/L   Potassium 3.4 (*) 3.5 - 5.1 mEq/L   Chloride 100  96 - 112 mEq/L   CO2 29  19 - 32 mEq/L   Glucose, Bld 81  70 - 99 mg/dL   BUN 7  6 - 23 mg/dL   Creatinine, Ser 7.82  0.50 - 1.10 mg/dL   Calcium 8.8  8.4 - 95.6 mg/dL   GFR calc non Af Amer >90  >90 mL/min   GFR calc Af Amer >90  >90 mL/min  URINALYSIS, ROUTINE W REFLEX MICROSCOPIC      Component Value Range   Color, Urine YELLOW  YELLOW   APPearance CLEAR  CLEAR   Specific Gravity, Urine 1.028  1.005 - 1.030   pH 5.0  5.0 - 8.0   Glucose, UA NEGATIVE   NEGATIVE mg/dL   Hgb urine dipstick NEGATIVE  NEGATIVE   Bilirubin Urine NEGATIVE  NEGATIVE   Ketones, ur NEGATIVE  NEGATIVE mg/dL   Protein, ur NEGATIVE  NEGATIVE mg/dL   Urobilinogen, UA 0.2  0.0 - 1.0 mg/dL   Nitrite NEGATIVE  NEGATIVE   Leukocytes, UA NEGATIVE  NEGATIVE  POCT PREGNANCY, URINE      Component Value Range   Preg Test, Ur NEGATIVE  NEGATIVE  WET PREP, GENITAL      Component Value Range   Yeast Wet Prep HPF POC NONE SEEN  NONE SEEN   Trich, Wet Prep NONE SEEN  NONE SEEN   Clue Cells Wet Prep HPF POC NONE SEEN  NONE SEEN   WBC, Wet Prep HPF POC NONE SEEN  NONE SEEN   US Transvaginal Non-ob  08/16/2012  *RADIOLOGY REPORT*  Clinical Data: Pelvic pain.  TRANSABDOMINAL AND TRANSVAGINAL ULTRASOUND OF PELVIS  Technique:  Both transabdominal and transvaginal ultrasound examinations of the pelvis were performed including evaluation of the uterus, ovaries, adnexal regions, and pelvic cul-de-sac.  Comparison: CT 07/14/2008  Findings:  Uterus: Prior hysterectomy.  Endometrium: Prior hysterectomy.  Right Ovary: 4.4 x 2.9 x 4.4 cm.  2.2 cm complex cystic area within the right ovary, likely small hemorrhagic cyst or follicle.  Left Ovary: 3.7 x 2.8 x 2.8 cm.  No focal ovarian abnormality. There are cystic areas adjacent to the left ovary measuring up to 1.4 and 1.9 cm.  These were likely present on prior CT from 2009.  Other Findings:  Small amount of free fluid adjacent to the ovaries bilaterally.  IMPRESSION: Prior hysterectomy.  Small hemorrhagic cyst or follicle in the right ovary.  Simple appearing left para ovarian cysts.   Original Report Authenticated By: Charlett Nose, M.D.    US Pelvis Complete  08/16/2012  *RADIOLOGY REPORT*  Clinical Data: Pelvic pain.  TRANSABDOMINAL AND TRANSVAGINAL ULTRASOUND OF PELVIS  Technique:  Both transabdominal and transvaginal ultrasound examinations of the pelvis were performed including evaluation of the uterus, ovaries, adnexal regions, and pelvic  cul-de-sac.  Comparison: CT 07/14/2008  Findings:  Uterus: Prior hysterectomy.  Endometrium: Prior hysterectomy.  Right Ovary: 4.4 x 2.9 x 4.4 cm.  2.2 cm complex cystic area within the right ovary, likely small hemorrhagic cyst or follicle.  Left Ovary: 3.7 x 2.8 x 2.8 cm.  No focal ovarian abnormality. There are cystic areas adjacent to the left ovary measuring up to 1.4 and 1.9 cm.  These were likely present on prior CT from 2009.  Other Findings:  Small amount of free fluid adjacent to the ovaries bilaterally.  IMPRESSION: Prior hysterectomy.  Small hemorrhagic cyst or follicle in the right ovary.  Simple appearing left para ovarian cysts.   Original Report Authenticated By: Charlett Nose, M.D.       1. Pseudotumor cerebri   2. Ovarian cyst       MDM  Exacerbation of pseudotumor cerebri. Have discussed with radiologist and she will be set up for outpatient lumbar puncture tomorrow. Pelvic pain will need to be evaluated with a pelvic exam and pelvic ultrasound.       Dione Booze, MD 08/17/12 [phone]

## 2012-08-16 NOTE — ED Notes (Signed)
Report given to RN in CDU, pt will be moved once bed is available

## 2012-08-16 NOTE — ED Provider Notes (Signed)
Assumed patient care in CDU. Patient with history of pseudotumor cerebri, with multiple symptomatic LPs treatment performs in the past presents with pseudotumor cerebri's related headache.  No focal neuro deficits on exam, no nuchal rigidity.  Pt is afebrile.  Is a&o x 3.  GCS 15.  Pt will benefit from IR LP tomorrow.  Order placed.    Pt also c/o lower abd pain.  Tenderness appreciated to RLQ and suprapubic region, no guarding no rebound.  Transvaginal US shows small hemorrhagic cyst in the R overal and simple appearing L ovarian cyst.  Reassurance given.  Will d/c with pain medication and instruction to return to [facility] tomorrow for therapeutic IR LP procedure.  Pt voice understanding and agrees with plan.    BP 162/94  Pulse 73  Temp 97.7 F (36.5 C) (Oral)  Resp 18  SpO2 100%  LMP 11/11/2011  I have reviewed nursing notes and vital signs. I personally reviewed the imaging tests through PACS system  I reviewed available ER/hospitalization records thought the EMR  Results for orders placed during the hospital encounter of 08/16/12  CBC WITH DIFFERENTIAL      Component Value Range   WBC 8.5  4.0 - 10.5 K/uL   RBC 4.44  3.87 - 5.11 MIL/uL   Hemoglobin 13.2  12.0 - 15.0 g/dL   HCT 78.2  95.6 - 21.3 %   MCV 85.6  78.0 - 100.0 fL   MCH 29.7  26.0 - 34.0 pg   MCHC 34.7  30.0 - 36.0 g/dL   RDW 08.6  57.8 - 46.9 %   Platelets 335  150 - 400 K/uL   Neutrophils Relative 64  43 - 77 %   Neutro Abs 5.4  1.7 - 7.7 K/uL   Lymphocytes Relative 29  12 - 46 %   Lymphs Abs 2.5  0.7 - 4.0 K/uL   Monocytes Relative 4  3 - 12 %   Monocytes Absolute 0.3  0.1 - 1.0 K/uL   Eosinophils Relative 3  0 - 5 %   Eosinophils Absolute 0.3  0.0 - 0.7 K/uL   Basophils Relative 0  0 - 1 %   Basophils Absolute 0.0  0.0 - 0.1 K/uL  BASIC METABOLIC PANEL      Component Value Range   Sodium 138  135 - 145 mEq/L   Potassium 3.4 (*) 3.5 - 5.1 mEq/L   Chloride 100  96 - 112 mEq/L   CO2 29  19 - 32 mEq/L   Glucose,  Bld 81  70 - 99 mg/dL   BUN 7  6 - 23 mg/dL   Creatinine, Ser 6.29  0.50 - 1.10 mg/dL   Calcium 8.8  8.4 - 52.8 mg/dL   GFR calc non Af Amer >90  >90 mL/min   GFR calc Af Amer >90  >90 mL/min  URINALYSIS, ROUTINE W REFLEX MICROSCOPIC      Component Value Range   Color, Urine YELLOW  YELLOW   APPearance CLEAR  CLEAR   Specific Gravity, Urine 1.028  1.005 - 1.030   pH 5.0  5.0 - 8.0   Glucose, UA NEGATIVE  NEGATIVE mg/dL   Hgb urine dipstick NEGATIVE  NEGATIVE   Bilirubin Urine NEGATIVE  NEGATIVE   Ketones, ur NEGATIVE  NEGATIVE mg/dL   Protein, ur NEGATIVE  NEGATIVE mg/dL   Urobilinogen, UA 0.2  0.0 - 1.0 mg/dL   Nitrite NEGATIVE  NEGATIVE   Leukocytes, UA NEGATIVE  NEGATIVE  POCT PREGNANCY, URINE  Component Value Range   Preg Test, Ur NEGATIVE  NEGATIVE  WET PREP, GENITAL      Component Value Range   Yeast Wet Prep HPF POC NONE SEEN  NONE SEEN   Trich, Wet Prep NONE SEEN  NONE SEEN   Clue Cells Wet Prep HPF POC NONE SEEN  NONE SEEN   WBC, Wet Prep HPF POC NONE SEEN  NONE SEEN   US Transvaginal Non-ob  08/16/2012  *RADIOLOGY REPORT*  Clinical Data: Pelvic pain.  TRANSABDOMINAL AND TRANSVAGINAL ULTRASOUND OF PELVIS  Technique:  Both transabdominal and transvaginal ultrasound examinations of the pelvis were performed including evaluation of the uterus, ovaries, adnexal regions, and pelvic cul-de-sac.  Comparison: CT 07/14/2008  Findings:  Uterus: Prior hysterectomy.  Endometrium: Prior hysterectomy.  Right Ovary: 4.4 x 2.9 x 4.4 cm.  2.2 cm complex cystic area within the right ovary, likely small hemorrhagic cyst or follicle.  Left Ovary: 3.7 x 2.8 x 2.8 cm.  No focal ovarian abnormality. There are cystic areas adjacent to the left ovary measuring up to 1.4 and 1.9 cm.  These were likely present on prior CT from 2009.  Other Findings:  Small amount of free fluid adjacent to the ovaries bilaterally.  IMPRESSION: Prior hysterectomy.  Small hemorrhagic cyst or follicle in the right  ovary.  Simple appearing left para ovarian cysts.   Original Report Authenticated By: Charlett Nose, M.D.    US Pelvis Complete  08/16/2012  *RADIOLOGY REPORT*  Clinical Data: Pelvic pain.  TRANSABDOMINAL AND TRANSVAGINAL ULTRASOUND OF PELVIS  Technique:  Both transabdominal and transvaginal ultrasound examinations of the pelvis were performed including evaluation of the uterus, ovaries, adnexal regions, and pelvic cul-de-sac.  Comparison: CT 07/14/2008  Findings:  Uterus: Prior hysterectomy.  Endometrium: Prior hysterectomy.  Right Ovary: 4.4 x 2.9 x 4.4 cm.  2.2 cm complex cystic area within the right ovary, likely small hemorrhagic cyst or follicle.  Left Ovary: 3.7 x 2.8 x 2.8 cm.  No focal ovarian abnormality. There are cystic areas adjacent to the left ovary measuring up to 1.4 and 1.9 cm.  These were likely present on prior CT from 2009.  Other Findings:  Small amount of free fluid adjacent to the ovaries bilaterally.  IMPRESSION: Prior hysterectomy.  Small hemorrhagic cyst or follicle in the right ovary.  Simple appearing left para ovarian cysts.   Original Report Authenticated By: Charlett Nose, M.D.       Fayrene Helper, PA-C 08/16/12 2257

## 2012-08-16 NOTE — ED Notes (Signed)
Pt c/o HA x 1 week and lower abd pain; pt denies vaginal discharge or UTI sx; pt sts hx of increased fluid in head and sts feels like is high and normally has to have LP

## 2012-08-16 NOTE — ED Notes (Signed)
Pt did not answer x 1 

## 2012-08-17 ENCOUNTER — Ambulatory Visit (HOSPITAL_COMMUNITY)
Admission: RE | Admit: 2012-08-17 | Discharge: 2012-08-17 | Disposition: A | Discharge status: Home / Self Care | Payer: Medicaid Other | Source: Ambulatory Visit | Attending: Emergency Medicine | Admitting: Emergency Medicine

## 2012-08-17 DIAGNOSIS — R51 Headache: Secondary | ICD-10-CM | POA: Insufficient documentation

## 2012-08-17 DIAGNOSIS — G932 Benign intracranial hypertension: Secondary | ICD-10-CM | POA: Insufficient documentation

## 2012-08-17 LAB — RPR: RPR Ser Ql: NONREACTIVE

## 2012-08-17 NOTE — ED Provider Notes (Signed)
Medical screening examination/treatment/procedure(s) were conducted as a shared visit with non-physician practitioner(s) and myself.  I personally evaluated the patient during the encounter   Dione Booze, MD 08/17/12 [phone]

## 2012-08-17 NOTE — Procedures (Signed)
*  RADIOLOGY REPORT* Clinical Data: [Pseudotumor cerebri.  Headache.] LUMBAR PUNCTURE FLUORO GUIDE Fluoro time:  23 seconds Comparison: {CT head 04/20/2012} Procedure:  I discussed the risks (including hemorrhage, infection, CSF leak, and nerve damage, among others), benefits, and alternatives to fluoroscopically guided therapeutic lumbar puncture with patient.  She understood and elected to undergo the procedure. Following sterile skin prep and local subcutaneous anesthetic administration of 1% lidocaine, a 22 gauge spinal needle was advanced without difficulty into the thecal sac at the L3-4 level under fluoroscopic guidance.  The patient has a history of requiring blood patch in the past, and I advanced the needle into the thecal sac with bevel vertical in order to minimize trauma to the thecal sac. Clear CSF was returned. The patient was turned from the prone into the left lateral decubitus position, and opening pressure measurement of 26 cm of water was obtained. A total of 17 ml of clear CSF was obtained.  Closing pressure was 13 cm of water. The needle was subsequently removed and the skin cleansed and bandaged.  No immediate complications were observed. IMPRESSION:   [ 1.  Technically successful therapeutic lumbar puncture for pseudotumor cerebri.  A total of 17 ml was withdrawn.  Opening pressure was 26 cm of water, and closing pressure was 13 cm of water.]

## 2012-08-24 ENCOUNTER — Encounter (HOSPITAL_COMMUNITY): Payer: Self-pay | Admitting: Anesthesiology

## 2012-08-24 ENCOUNTER — Ambulatory Visit (HOSPITAL_COMMUNITY): Payer: Medicaid Other | Attending: Anesthesiology

## 2012-08-24 DIAGNOSIS — Y849 Medical procedure, unspecified as the cause of abnormal reaction of the patient, or of later complication, without mention of misadventure at the time of the procedure: Secondary | ICD-10-CM | POA: Insufficient documentation

## 2012-08-24 DIAGNOSIS — G971 Other reaction to spinal and lumbar puncture: Secondary | ICD-10-CM | POA: Insufficient documentation

## 2012-08-24 NOTE — Anesthesia Preprocedure Evaluation (Signed)
Anesthesia Evaluation  Patient identified by MRN, date of birth, ID band Patient awake    Reviewed: Allergy & Precautions, H&P , NPO status , Patient's Chart, lab work & pertinent test results  Airway Mallampati: II TM Distance: >3 FB     Dental  (+) Teeth Intact   Pulmonary asthma ,  breath sounds clear to auscultation  Pulmonary exam normal       Cardiovascular hypertension, negative cardio ROS  Rhythm:Regular Rate:Normal     Neuro/Psych Pseudotumor cerebri hx. Recent LP by neurologist with post PL headache.    GI/Hepatic GERD-  Medicated,  Endo/Other    Renal/GU      Musculoskeletal   Abdominal   Peds  Hematology   Anesthesia Other Findings Positional headache of moderate - severe intensity, following lumbar puncture in ED 7 days ago. Unresponsive to conservative medical therapy. Plan for epidural blood patch to resolve this problem.  Reproductive/Obstetrics                           Anesthesia Physical Anesthesia Plan  ASA: II  Anesthesia Plan:    Post-op Pain Management:    Induction:   Airway Management Planned: Nasal Cannula  Additional Equipment:   Intra-op Plan:   Post-operative Plan:   Informed Consent: I have reviewed the patients History and Physical, chart, labs and discussed the procedure including the risks, benefits and alternatives for the proposed anesthesia with the patient or authorized representative who has indicated his/her understanding and acceptance.     Plan Discussed with:   Anesthesia Plan Comments:         Anesthesia Quick Evaluation

## 2012-08-24 NOTE — Progress Notes (Signed)
Patient remained flat for 30 mins post procedure in pacu.will go to post op for 1 hour and recline in chair.

## 2012-08-24 NOTE — Patient Instructions (Addendum)
Epidural Blood Patching in Spinal Headache An epidural blood patch (EBP) is a procedure used to treat a headache you may get following a spinal tap (epidural). This kind of headache is often called a spinal headache. This headache may also be called a postdural puncture headache (PDPH). An epidural blood patch involves injecting a small amount of your own blood into the space near the epidural puncture site. An epidural blood patch is generally used for treating a person who has received epidural anesthesia and who has symptoms that may include:  A headache that is so debilitating that it interferes with your ability to care for your baby.  A headache that has not been relieved by 2 to 3 days of:  Bed rest.  Drinking lots of fluids, including caffeinated drinks.  Taking oral medications for pain, including nonsteroidal anti-inflammatory agents. Only take over-the-counter or prescription medicines for pain, discomfort, or fever as directed by your caregiver.  Neck pain and stiffness that is very severe and associated with vomiting.  Hearing loss.  Double vision. An epidural blood patch is not used when:  Your symptoms are due to an infection in the lower back (lumbar) area or the blood.  You have bleeding tendencies.  You are taking certain blood thinning medications. PROCEDURE  Blood is withdrawn from the arm and injected into the space where the cerebrospinal fluid (CSF) is thought to be leaking. This is the same spot where the epidural anesthetic was injected. When the blood is injected, generally there will be a feeling of tightness in the buttocks, lower back, or thighs. AFTER THE PROCEDURE  You will be expected to lie on your back for 2 to 4 hours with some pillows under your knees. It is important to lie still while on your back so that a good clot can form. You should avoid any straining, especially right after the procedure. HOME CARE INSTRUCTIONS   Do not carry anything  heavier than 15 lb (7 kg) for 2 to 3 weeks.  Get as much bed rest as you can for the first 24 hours after the blood patch.  Squat rather than bend when picking up items in a low position.  Avoid excessive straining. This can cause the patch to blow out and the spinal headache to return.  Continue to drink as much as you can, especially beverages with caffeine in them.  Report any temperature, back pain, or pain radiating down the legs. Report to your caregiver if the headache returns or if you have any difficulty with your bowels or bladder. Also report any other problems not controlled by medications. Most patients obtain almost instant relief from the spinal headache. In some, the relief comes on gradually over a 24-hour period. Following the EBP some people experience mild backache for a few days. Less than 2% of people also have a mild, passing sensation of prickly or tingly skin (paraesthesiae), neck pain, or nerve-root pain. MAKE SURE YOU:   Understand these instructions.  Will watch your condition.  Will get help right away if you are not doing well or get worse. Document Released: 03/14/2002 Document Revised: 12/15/2011 Document Reviewed: 01/17/2009 [facility] Patient Information 2013 [place], [place].

## 2012-08-24 NOTE — Progress Notes (Signed)
Laid supine from 8:22 until 8:51

## 2012-08-24 NOTE — Anesthesia Procedure Notes (Signed)
Epidural Patient location during procedure: post-op Start time: 08/24/2012 8:00 AM End time: 08/24/2012 8:23 AM  Staffing Anesthesiologist: Laurene Footman Performed by: anesthesiologist   Preanesthetic Checklist Completed: patient identified, site marked, surgical consent, pre-op evaluation, timeout performed, risks and benefits discussed and monitors and equipment checked  Epidural Patient position: sitting Prep: Betadine Patient monitoring: heart rate, continuous pulse ox and blood pressure Approach: midline Injection technique: LOR saline  Needle:  Needle type: Tuohy  Needle gauge: 17 G Needle length: 9 cm Needle insertion depth: 7 cm  Additional Notes Epidural space verified with loss of resistance technique. 15cc of autologous blood instilled. Pt tolerated procedure very well.  Will remain in recumbent position for 2 hrs and then d/c home for further bedrest.  Post procedure epidural blood patch instruction sheet explained to patient and provided with a written copy. Reason for block:Epidural Blood Patch

## 2012-08-24 NOTE — Progress Notes (Signed)
Patient discharge to home with epidural blood patch instructions. Went over instructions with patient and husband. Verbalized understanding. Discharged home with husband driving.

## 2012-08-29 ENCOUNTER — Emergency Department: Payer: Self-pay | Admitting: Emergency Medicine

## 2012-08-30 ENCOUNTER — Encounter (HOSPITAL_COMMUNITY): Payer: Self-pay | Admitting: *Deleted

## 2012-08-30 ENCOUNTER — Emergency Department (HOSPITAL_COMMUNITY): Payer: Medicaid Other

## 2012-08-30 ENCOUNTER — Emergency Department (HOSPITAL_COMMUNITY)
Admission: EM | Admit: 2012-08-30 | Discharge: 2012-08-30 | Disposition: A | Discharge status: Home / Self Care | Payer: Medicaid Other | Attending: Emergency Medicine | Admitting: Emergency Medicine

## 2012-08-30 DIAGNOSIS — M545 Low back pain, unspecified: Secondary | ICD-10-CM | POA: Insufficient documentation

## 2012-08-30 DIAGNOSIS — G8918 Other acute postprocedural pain: Secondary | ICD-10-CM | POA: Insufficient documentation

## 2012-08-30 DIAGNOSIS — J45909 Unspecified asthma, uncomplicated: Secondary | ICD-10-CM | POA: Insufficient documentation

## 2012-08-30 DIAGNOSIS — Z79899 Other long term (current) drug therapy: Secondary | ICD-10-CM | POA: Insufficient documentation

## 2012-08-30 DIAGNOSIS — R51 Headache: Secondary | ICD-10-CM

## 2012-08-30 DIAGNOSIS — M542 Cervicalgia: Secondary | ICD-10-CM | POA: Insufficient documentation

## 2012-08-30 DIAGNOSIS — I10 Essential (primary) hypertension: Secondary | ICD-10-CM | POA: Insufficient documentation

## 2012-08-30 DIAGNOSIS — G932 Benign intracranial hypertension: Secondary | ICD-10-CM | POA: Insufficient documentation

## 2012-08-30 DIAGNOSIS — Z8719 Personal history of other diseases of the digestive system: Secondary | ICD-10-CM | POA: Insufficient documentation

## 2012-08-30 DIAGNOSIS — R509 Fever, unspecified: Secondary | ICD-10-CM | POA: Insufficient documentation

## 2012-08-30 DIAGNOSIS — Z3202 Encounter for pregnancy test, result negative: Secondary | ICD-10-CM | POA: Insufficient documentation

## 2012-08-30 DIAGNOSIS — M549 Dorsalgia, unspecified: Secondary | ICD-10-CM

## 2012-08-30 LAB — CBC WITH DIFFERENTIAL/PLATELET
Eosinophils Absolute: 0.3 10*3/uL (ref 0.0–0.7)
Eosinophils Relative: 5 % (ref 0–5)
HCT: 37.6 % (ref 36.0–46.0)
Hemoglobin: 12.9 g/dL (ref 12.0–15.0)
Lymphocytes Relative: 38 % (ref 12–46)
Lymphs Abs: 2.4 10*3/uL (ref 0.7–4.0)
MCH: 29.6 pg (ref 26.0–34.0)
MCV: 86.2 fL (ref 78.0–100.0)
Monocytes Absolute: 0.5 10*3/uL (ref 0.1–1.0)
Monocytes Relative: 7 % (ref 3–12)
Platelets: 382 10*3/uL (ref 150–400)
RBC: 4.36 MIL/uL (ref 3.87–5.11)
WBC: 6.5 10*3/uL (ref 4.0–10.5)

## 2012-08-30 LAB — URINALYSIS, ROUTINE W REFLEX MICROSCOPIC
Glucose, UA: NEGATIVE mg/dL
Hgb urine dipstick: NEGATIVE
Ketones, ur: NEGATIVE mg/dL
Leukocytes, UA: NEGATIVE
Protein, ur: NEGATIVE mg/dL
pH: 6 (ref 5.0–8.0)

## 2012-08-30 LAB — BASIC METABOLIC PANEL
BUN: 13 mg/dL (ref 6–23)
CO2: 22 mEq/L (ref 19–32)
Calcium: 9.2 mg/dL (ref 8.4–10.5)
GFR calc non Af Amer: >90 mL/min (ref >90)
Glucose, Bld: 74 mg/dL (ref 70–99)

## 2012-08-30 MED ORDER — MORPHINE SULFATE 4 MG/ML IJ SOLN
4.0000 mg | Freq: Once | INTRAMUSCULAR | Status: AC
  Administered 2012-08-30: 4 mg via INTRAVENOUS
  Filled 2012-08-30: qty 1

## 2012-08-30 MED ORDER — ONDANSETRON HCL 4 MG/2ML IJ SOLN
4.0000 mg | Freq: Once | INTRAMUSCULAR | Status: AC
  Administered 2012-08-30: 4 mg via INTRAVENOUS
  Filled 2012-08-30: qty 2

## 2012-08-30 MED ORDER — HYDROMORPHONE HCL PF 1 MG/ML IJ SOLN
1.0000 mg | Freq: Once | INTRAMUSCULAR | Status: AC
  Administered 2012-08-30: 1 mg via INTRAVENOUS
  Filled 2012-08-30: qty 1

## 2012-08-30 MED ORDER — SODIUM CHLORIDE 0.9 % IV BOLUS (SEPSIS)
1000.0000 mL | Freq: Once | INTRAVENOUS | Status: AC
  Administered 2012-08-30: 1000 mL via INTRAVENOUS

## 2012-08-30 MED ORDER — OXYCODONE-ACETAMINOPHEN 5-325 MG PO TABS
2.0000 | ORAL_TABLET | Freq: Once | ORAL | Status: AC
  Administered 2012-08-30: 2 via ORAL
  Filled 2012-08-30: qty 2

## 2012-08-30 MED ORDER — OXYCODONE-ACETAMINOPHEN 5-325 MG PO TABS: 1.0000 | ORAL_TABLET | Freq: Four times a day (QID) | ORAL | Status: DC | PRN

## 2012-08-30 NOTE — ED Provider Notes (Signed)
Medical screening examination/treatment/procedure(s) were performed by non-physician practitioner and as supervising physician I was immediately available for consultation/collaboration.  Flint Melter, MD 08/30/12 [phone]

## 2012-08-30 NOTE — ED Provider Notes (Signed)
History     CSN: [id number]  Arrival date & time 08/30/12  1410   First MD Initiated Contact with Patient 08/30/12 1652      Chief Complaint  Patient presents with  . Headache  . Back Pain    (Consider location/radiation/quality/duration/timing/severity/associated sxs/prior treatment) HPI  36 year old female with history of pseudotumor cerebri with recurrent headache presents with worsening headache.  Pt reports she has a therapeutic LP performed on 11/12 and subsequently developed worsening headache.  She continues to endure pain for nearly a week but subsequently f/u with her neurologist.  She then received therapeutic blood patch which took 2 separate tries.  SInce receiving the blood patch she has had persistent back pain at the site of the injection.  Pain is describe as a sharp sensation, with pressure worsening with movement.  For the past few days her headache has returned.  Described as a throbbing and pressure sensation with light and sound sensitivity.  She also felt a knot to the back of her head which has increased in size and ttp.  Has subjective fever, and chills.  No changes in vision, no n/v/d, cp, sob, or dysuria, no rash.         Past Medical History  Diagnosis Date  . Pseudotumor cerebri   . Asthma   . GERD (gastroesophageal reflux disease)   . Hypertension     Past Surgical History  Procedure Date  . Cholecystectomy   . Achilles tendon lengthening   . Abdominal hysterectomy   . Lumbar puncture 12/04/2011    dr. Gerilyn Pilgrim    Family History  Problem Relation Age of Onset  . Migraines Mother   . Cancer Maternal Uncle   . Cancer Maternal Uncle   . Cancer Paternal Grandmother   . Cancer Paternal Grandfather     History  Substance Use Topics  . Smoking status: Never Smoker   . Smokeless tobacco: Never Used  . Alcohol Use: No    OB History    Grav Para Term Preterm Abortions TAB SAB Ect Mult Living   3 3 3       3       Review of Systems    Constitutional: Positive for chills. Negative for fever, activity change and appetite change.  HENT: Positive for neck pain. Negative for trouble swallowing and neck stiffness.   Eyes: Negative for pain.  Respiratory: Negative for chest tightness and shortness of breath.   Cardiovascular: Negative for chest pain.  Gastrointestinal: Negative for abdominal pain.  Genitourinary: Negative for dysuria.  Musculoskeletal: Positive for back pain.  Skin: Negative for rash and wound.  Neurological: Positive for headaches. Negative for numbness.    Allergies  Review of patient's allergies indicates no known allergies.  Home Medications   Current Outpatient Rx  Name  Route  Sig  Dispense  Refill  . ACETAZOLAMIDE 250 MG PO TABS   Oral   Take 250 mg by mouth 2 (two) times daily.         . ALBUTEROL SULFATE HFA 108 (90 BASE) MCG/ACT IN AERS   Inhalation   Inhale 2 puffs into the lungs every 4 (four) hours as needed. For shortness of breath         . ALPRAZOLAM 1 MG PO TABS   Oral   Take 1 mg by mouth 4 (four) times daily as needed. For anxiety         . BUDESONIDE-FORMOTEROL FUMARATE 80-4.5 MCG/ACT IN AERO   Inhalation  Inhale 2 puffs into the lungs 2 (two) times daily.           Marland Kitchen HYDROCODONE-ACETAMINOPHEN 5-500 MG PO TABS   Oral   Take 1-2 tablets by mouth every 6 (six) hours as needed for pain.   15 tablet   0   . PHENTERMINE HCL 37.5 MG PO TABS   Oral   Take 37.5 mg by mouth daily before breakfast.         . PRESCRIPTION MEDICATION   Intramuscular   Inject 1 each into the muscle every 30 (thirty) days. lipoplex injection         . TOPIRAMATE 25 MG PO TABS   Oral   Take 25 mg by mouth 2 (two) times daily.           BP 134/54  Pulse 75  Temp 98.4 F (36.9 C) (Oral)  Resp 18  SpO2 100%  LMP 11/11/2011  Physical Exam  Nursing note and vitals reviewed. Constitutional: She is oriented to person, place, and time. She appears well-developed and  well-nourished. No distress.       Nontoxic appearance  HENT:  Head: Normocephalic and atraumatic.       Patient complaining of knot in the back of her head, no obvious knot noted on scalp exam.  Occipital region mildly tender on palpation  Eyes: Conjunctivae normal and EOM are normal. Pupils are equal, round, and reactive to light.  Neck:       Tenderness to neck without nuchal rigidity  Cardiovascular: Normal rate and regular rhythm.   Pulmonary/Chest: Effort normal and breath sounds normal. No respiratory distress. She exhibits no tenderness.  Abdominal: Soft. There is no tenderness.       No CVA tenderness  Musculoskeletal: Normal range of motion. She exhibits tenderness (Tenderness to her low back without evidence of abscess or infection overlying skin changes). She exhibits no edema.  Neurological: She is alert and oriented to person, place, and time. She displays normal reflexes. No cranial nerve deficit. She exhibits normal muscle tone. Coordination normal.  Skin: Skin is warm. No rash noted.  Psychiatric: She has a normal mood and affect.    ED Course  Procedures (including critical care time)  Labs Reviewed - No data to display No results found.   No diagnosis found.  Results for orders placed during the hospital encounter of 08/30/12  CBC WITH DIFFERENTIAL      Component Value Range   WBC 6.5  4.0 - 10.5 K/uL   RBC 4.36  3.87 - 5.11 MIL/uL   Hemoglobin 12.9  12.0 - 15.0 g/dL   HCT 82.9  56.2 - 13.0 %   MCV 86.2  78.0 - 100.0 fL   MCH 29.6  26.0 - 34.0 pg   MCHC 34.3  30.0 - 36.0 g/dL   RDW 86.5  78.4 - 69.6 %   Platelets 382  150 - 400 K/uL   Neutrophils Relative 50  43 - 77 %   Neutro Abs 3.3  1.7 - 7.7 K/uL   Lymphocytes Relative 38  12 - 46 %   Lymphs Abs 2.4  0.7 - 4.0 K/uL   Monocytes Relative 7  3 - 12 %   Monocytes Absolute 0.5  0.1 - 1.0 K/uL   Eosinophils Relative 5  0 - 5 %   Eosinophils Absolute 0.3  0.0 - 0.7 K/uL   Basophils Relative 1  0 - 1 %     Basophils Absolute 0.0  0.0 - 0.1 K/uL  BASIC METABOLIC PANEL      Component Value Range   Sodium 138  135 - 145 mEq/L   Potassium 3.6  3.5 - 5.1 mEq/L   Chloride 108  96 - 112 mEq/L   CO2 22  19 - 32 mEq/L   Glucose, Bld 74  70 - 99 mg/dL   BUN 13  6 - 23 mg/dL   Creatinine, Ser 7.82  0.50 - 1.10 mg/dL   Calcium 9.2  8.4 - 95.6 mg/dL   GFR calc non Af Amer >90  >90 mL/min   GFR calc Af Amer >90  >90 mL/min  PREGNANCY, URINE      Component Value Range   Preg Test, Ur NEGATIVE  NEGATIVE  URINALYSIS, ROUTINE W REFLEX MICROSCOPIC      Component Value Range   Color, Urine YELLOW  YELLOW   APPearance CLEAR  CLEAR   Specific Gravity, Urine 1.024  1.005 - 1.030   pH 6.0  5.0 - 8.0   Glucose, UA NEGATIVE  NEGATIVE mg/dL   Hgb urine dipstick NEGATIVE  NEGATIVE   Bilirubin Urine NEGATIVE  NEGATIVE   Ketones, ur NEGATIVE  NEGATIVE mg/dL   Protein, ur NEGATIVE  NEGATIVE mg/dL   Urobilinogen, UA 0.2  0.0 - 1.0 mg/dL   Nitrite NEGATIVE  NEGATIVE   Leukocytes, UA NEGATIVE  NEGATIVE   Ct Head Wo Contrast  08/30/2012  *RADIOLOGY REPORT*  Clinical Data: Headache.  History of pseudotumor cerebri.  CT HEAD WITHOUT CONTRAST  Technique:  Contiguous axial images were obtained from the base of the skull through the vertex without contrast.  Comparison: Head CT 04/20/2012.  Findings: No acute intracranial abnormalities.  Specifically, no evidence of acute/subacute cerebral ischemia, no acute intracranial hemorrhage, no mass, mass effect, hydrocephalus or abnormal intra or extra axial fluid collections.  The visualized paranasal sinuses and mastoids are well pneumatized.  No acute displaced fractures are identified.  IMPRESSION: 1.  No acute intracranial abnormalities. 2.  The appearance of the brain is normal.   Original Report Authenticated By: Trudie Reed, M.D.    US Transvaginal Non-ob  08/16/2012  *RADIOLOGY REPORT*  Clinical Data: Pelvic pain.  TRANSABDOMINAL AND TRANSVAGINAL ULTRASOUND OF  PELVIS  Technique:  Both transabdominal and transvaginal ultrasound examinations of the pelvis were performed including evaluation of the uterus, ovaries, adnexal regions, and pelvic cul-de-sac.  Comparison: CT 07/14/2008  Findings:  Uterus: Prior hysterectomy.  Endometrium: Prior hysterectomy.  Right Ovary: 4.4 x 2.9 x 4.4 cm.  2.2 cm complex cystic area within the right ovary, likely small hemorrhagic cyst or follicle.  Left Ovary: 3.7 x 2.8 x 2.8 cm.  No focal ovarian abnormality. There are cystic areas adjacent to the left ovary measuring up to 1.4 and 1.9 cm.  These were likely present on prior CT from 2009.  Other Findings:  Small amount of free fluid adjacent to the ovaries bilaterally.  IMPRESSION: Prior hysterectomy.  Small hemorrhagic cyst or follicle in the right ovary.  Simple appearing left para ovarian cysts.   Original Report Authenticated By: Charlett Nose, M.D.    US Pelvis Complete  08/16/2012  *RADIOLOGY REPORT*  Clinical Data: Pelvic pain.  TRANSABDOMINAL AND TRANSVAGINAL ULTRASOUND OF PELVIS  Technique:  Both transabdominal and transvaginal ultrasound examinations of the pelvis were performed including evaluation of the uterus, ovaries, adnexal regions, and pelvic cul-de-sac.  Comparison: CT 07/14/2008  Findings:  Uterus: Prior hysterectomy.  Endometrium: Prior hysterectomy.  Right Ovary: 4.4 x 2.9  x 4.4 cm.  2.2 cm complex cystic area within the right ovary, likely small hemorrhagic cyst or follicle.  Left Ovary: 3.7 x 2.8 x 2.8 cm.  No focal ovarian abnormality. There are cystic areas adjacent to the left ovary measuring up to 1.4 and 1.9 cm.  These were likely present on prior CT from 2009.  Other Findings:  Small amount of free fluid adjacent to the ovaries bilaterally.  IMPRESSION: Prior hysterectomy.  Small hemorrhagic cyst or follicle in the right ovary.  Simple appearing left para ovarian cysts.   Original Report Authenticated By: Charlett Nose, M.D.    Dg Fluoro Guide Lumbar  Puncture  08/17/2012  *RADIOLOGY REPORT*  Clinical Data: Pseudotumor cerebri.  Headache.  LUMBAR PUNCTURE FLUORO GUIDE  Fluoro time:  23 seconds  Comparison: CT head 04/20/2012  Procedure:  I discussed the risks (including hemorrhage, infection, CSF leak, and nerve damage, among others), benefits, and alternatives to fluoroscopically guided therapeutic lumbar puncture with patient.  She understood and elected to undergo the procedure.  Following sterile skin prep and local subcutaneous anesthetic administration of 1% lidocaine, a 22 gauge spinal needle was advanced without difficulty into the thecal sac at the L3-4 level under fluoroscopic guidance.  The patient has a history of requiring blood patch in the past, and I advanced the needle into the thecal sac with bevel vertical in order to minimize trauma to the thecal sac. Clear CSF was returned.  The patient was turned from the prone into the left lateral decubitus position, and opening pressure measurement of 26 cm of water was obtained.  A total of 17 ml of clear CSF was obtained.  Closing pressure was 13 cm of water.  The needle was subsequently removed and the skin cleansed and bandaged.  No immediate complications were observed.  IMPRESSION:  1.  Technically successful therapeutic lumbar puncture for pseudotumor cerebri.  A total of 17 ml was withdrawn.  Opening pressure was 26 cm of water, and closing pressure was 13 cm of water.   Original Report Authenticated By: Gaylyn Rong, M.D.     1. Headache 2. Back pain  MDM  Pt w/ hx of Pseudotumor cerebri presents with worsening headache and also back pain at the site of blood patch which was performed 1 week ago.  She is uncomfortable but nontoxic.  She is afebrile with stable normal vital sign.  No obvious signs of infection.  Will check labs, head Ct, give pain meds and will continue to monitor.    08/17/2012: Technically successful therapeutic lumbar puncture for pseudotumor cerebri. A total of  17 ml was withdrawn. Opening pressure was 26 cm of water, and closing pressure was 13 cm of water  6:57 PM Labs show no evidence of electrolytes imbalance, WBC normal.  Head CT unremarkable.  Pt continue to endorse pain.  I discussed with my attending, our plan is pain management only.    8:37 PM Pt is stable, pain medication given.  Pt request for neurosurgery referral as she would like to have a shunt placed for further management of her pseudotumor cerebri.  I will give referral to Dr. Dutch Quint.  Pt stable to be discharge.  Family member at bedside.  BP 134/54  Pulse 75  Temp 98.4 F (36.9 C) (Oral)  Resp 18  SpO2 100%  LMP 11/11/2011  I have reviewed nursing notes and vital signs. I personally reviewed the imaging tests through PACS system  I reviewed available ER/hospitalization records thought the EMR  Fayrene Helper, PA-C 08/30/12 2038

## 2012-08-30 NOTE — ED Notes (Signed)
PT is here with knot to left posterior head.  Pt states she had extra fluid drained off of spinal column recently and then had to get the blood patch with it.  Pt states usually back pain and headache has worsened.

## 2012-08-30 NOTE — ED Notes (Signed)
Pt remains in radiology 

## 2012-10-04 ENCOUNTER — Other Ambulatory Visit (HOSPITAL_COMMUNITY): Payer: Self-pay | Admitting: Family Medicine

## 2012-10-04 ENCOUNTER — Ambulatory Visit (HOSPITAL_COMMUNITY)
Admission: RE | Admit: 2012-10-04 | Discharge: 2012-10-04 | Disposition: A | Discharge status: Home / Self Care | Payer: Medicaid Other | Source: Ambulatory Visit | Attending: Family Medicine | Admitting: Family Medicine

## 2012-10-04 DIAGNOSIS — R079 Chest pain, unspecified: Secondary | ICD-10-CM | POA: Insufficient documentation

## 2012-10-04 DIAGNOSIS — R509 Fever, unspecified: Secondary | ICD-10-CM | POA: Insufficient documentation

## 2012-10-04 DIAGNOSIS — R05 Cough: Secondary | ICD-10-CM

## 2012-10-04 DIAGNOSIS — R059 Cough, unspecified: Secondary | ICD-10-CM | POA: Insufficient documentation

## 2012-12-23 ENCOUNTER — Encounter (HOSPITAL_COMMUNITY): Payer: Self-pay | Admitting: *Deleted

## 2012-12-23 ENCOUNTER — Emergency Department (HOSPITAL_COMMUNITY): Payer: Medicaid Other

## 2012-12-23 ENCOUNTER — Emergency Department (HOSPITAL_COMMUNITY)
Admission: EM | Admit: 2012-12-23 | Discharge: 2012-12-24 | Disposition: A | Discharge status: Home / Self Care | Payer: Medicaid Other | Attending: Emergency Medicine | Admitting: Emergency Medicine

## 2012-12-23 DIAGNOSIS — I10 Essential (primary) hypertension: Secondary | ICD-10-CM | POA: Insufficient documentation

## 2012-12-23 DIAGNOSIS — Z79899 Other long term (current) drug therapy: Secondary | ICD-10-CM | POA: Insufficient documentation

## 2012-12-23 DIAGNOSIS — R109 Unspecified abdominal pain: Secondary | ICD-10-CM | POA: Insufficient documentation

## 2012-12-23 DIAGNOSIS — J45909 Unspecified asthma, uncomplicated: Secondary | ICD-10-CM | POA: Insufficient documentation

## 2012-12-23 DIAGNOSIS — Z8719 Personal history of other diseases of the digestive system: Secondary | ICD-10-CM | POA: Insufficient documentation

## 2012-12-23 DIAGNOSIS — Z8669 Personal history of other diseases of the nervous system and sense organs: Secondary | ICD-10-CM | POA: Insufficient documentation

## 2012-12-23 LAB — CBC WITH DIFFERENTIAL/PLATELET
Basophils Absolute: 0 10*3/uL (ref 0.0–0.1)
Eosinophils Relative: 5 % (ref 0–5)
HCT: 36.9 % (ref 36.0–46.0)
Hemoglobin: 12.7 g/dL (ref 12.0–15.0)
Lymphocytes Relative: 42 % (ref 12–46)
MCHC: 34.4 g/dL (ref 30.0–36.0)
MCV: 86.2 fL (ref 78.0–100.0)
Monocytes Absolute: 0.4 10*3/uL (ref 0.1–1.0)
Monocytes Relative: 7 % (ref 3–12)
Neutro Abs: 2.8 10*3/uL (ref 1.7–7.7)
RDW: 13.5 % (ref 11.5–15.5)
WBC: 6.2 10*3/uL (ref 4.0–10.5)

## 2012-12-23 LAB — URINALYSIS, ROUTINE W REFLEX MICROSCOPIC
Leukocytes, UA: NEGATIVE
Nitrite: NEGATIVE
Protein, ur: NEGATIVE mg/dL
Specific Gravity, Urine: 1.02 (ref 1.005–1.030)
Urobilinogen, UA: 0.2 mg/dL (ref 0.0–1.0)

## 2012-12-23 LAB — COMPREHENSIVE METABOLIC PANEL
BUN: 15 mg/dL (ref 6–23)
CO2: 24 mEq/L (ref 19–32)
Calcium: 9.3 mg/dL (ref 8.4–10.5)
Chloride: 106 mEq/L (ref 96–112)
Creatinine, Ser: 0.81 mg/dL (ref 0.50–1.10)
GFR calc Af Amer: >90 mL/min (ref >90)
GFR calc non Af Amer: >90 mL/min (ref >90)
Total Bilirubin: 0.1 mg/dL — ABNORMAL LOW (ref 0.3–1.2)

## 2012-12-23 MED ORDER — DICYCLOMINE HCL 20 MG PO TABS: 20.0000 mg | ORAL_TABLET | Freq: Four times a day (QID) | ORAL | Status: DC

## 2012-12-23 MED ORDER — IOHEXOL 300 MG/ML  SOLN
50.0000 mL | Freq: Once | INTRAMUSCULAR | Status: AC | PRN
  Administered 2012-12-23: 50 mL via ORAL

## 2012-12-23 MED ORDER — IOHEXOL 300 MG/ML  SOLN
100.0000 mL | Freq: Once | INTRAMUSCULAR | Status: AC | PRN
  Administered 2012-12-23: 100 mL via INTRAVENOUS

## 2012-12-23 MED ORDER — TRAMADOL HCL 50 MG PO TABS: 50.0000 mg | ORAL_TABLET | Freq: Four times a day (QID) | ORAL | Status: DC | PRN

## 2012-12-23 NOTE — ED Provider Notes (Signed)
History  This chart was scribed for Gilda Crease, MD by Shari Heritage, ED Scribe. The patient was seen in room APA04/APA04. Patient's care was started at 2021.   CSN: [id number]  Arrival date & time 12/23/12  1945   First MD Initiated Contact with Patient 12/23/12 2021      Chief Complaint  Patient presents with  . Abdominal Pain      The history is provided by the patient. No language interpreter was used.     HPI Comments: [name] is a 37 y.o. female with history of pseudotumor cerebri, asthma, GERD and hypertension who presents to the Emergency Department complaining of sudden, moderate to severe, intermittent lower abdominal pain for the several hours. Patient says that she has sporadic pain episodes in her lower abdomen and complains of "heaviness" in her abdomen. Pain is worse with ambulation. She denies difficulty urinating, dysuria, hematuria, urinary frequency, fever, vomiting, diarrhea or constipation. Patient says that about a year ago, she began having sudden onset lower abdominal pains. When she was worked up for this problem, she had a CT which showed a blood clot in her lower abdominal area. She states that she went on Coumadin for several months following the diagnosis. Patient does not smoke or use alcohol.    Past Medical History  Diagnosis Date  . Pseudotumor cerebri   . Asthma   . GERD (gastroesophageal reflux disease)   . Hypertension     Past Surgical History  Procedure Laterality Date  . Cholecystectomy    . Achilles tendon lengthening    . Abdominal hysterectomy    . Lumbar puncture  12/04/2011    dr. Gerilyn Pilgrim    Family History  Problem Relation Age of Onset  . Migraines Mother   . Cancer Maternal Uncle   . Cancer Maternal Uncle   . Cancer Paternal Grandmother   . Cancer Paternal Grandfather     History  Substance Use Topics  . Smoking status: Never Smoker   . Smokeless tobacco: Never Used  . Alcohol Use: No    OB History    Grav Para Term Preterm Abortions TAB SAB Ect Mult Living   3 3 3       3       Review of Systems  Constitutional: Negative for fever.  Gastrointestinal: Positive for abdominal pain. Negative for vomiting, diarrhea and constipation.  Genitourinary: Negative for dysuria, urgency, frequency and difficulty urinating.  All other systems reviewed and are negative.    Allergies  Review of patient's allergies indicates no known allergies.  Home Medications   Current Outpatient Rx  Name  Route  Sig  Dispense  Refill  . acetaZOLAMIDE (DIAMOX) 250 MG tablet   Oral   Take 250 mg by mouth 2 (two) times daily.         Marland Kitchen albuterol (PROVENTIL HFA;VENTOLIN HFA) 108 (90 BASE) MCG/ACT inhaler   Inhalation   Inhale 2 puffs into the lungs every 4 (four) hours as needed. For shortness of breath         . ALPRAZolam (XANAX) 1 MG tablet   Oral   Take 1 mg by mouth 4 (four) times daily as needed. For anxiety         . budesonide-formoterol (SYMBICORT) 80-4.5 MCG/ACT inhaler   Inhalation   Inhale 2 puffs into the lungs 2 (two) times daily.           Marland Kitchen oxyCODONE-acetaminophen (PERCOCET/ROXICET) 5-325 MG per tablet   Oral  Take 1-2 tablets by mouth every 6 (six) hours as needed for pain.   20 tablet   0   . phentermine (ADIPEX-P) 37.5 MG tablet   Oral   Take 37.5 mg by mouth daily before breakfast.         . PRESCRIPTION MEDICATION   Intramuscular   Inject 1 each into the muscle every 21 ( twenty-one) days. lipoplex injection         . topiramate (TOPAMAX) 25 MG tablet   Oral   Take 25 mg by mouth 2 (two) times daily.           Triage Vitals: BP 156/104  Pulse 80  Temp(Src) 98 F (36.7 C) (Oral)  Resp 20  Ht 5\' 10"  (1.778 m)  Wt 198 lb (89.812 kg)  BMI 28.41 kg/m2  SpO2 100%  LMP 11/11/2011  Physical Exam  Constitutional: She is oriented to person, place, and time. She appears well-developed and well-nourished. No distress.  HENT:  Head: Normocephalic and  atraumatic.  Right Ear: Hearing normal.  Nose: Nose normal.  Mouth/Throat: Oropharynx is clear and moist and mucous membranes are normal.  Eyes: Conjunctivae and EOM are normal. Pupils are equal, round, and reactive to light.  Neck: Normal range of motion. Neck supple.  Cardiovascular: Normal rate, regular rhythm, S1 normal and S2 normal.  Exam reveals no gallop and no friction rub.   No murmur heard. Pulmonary/Chest: Effort normal and breath sounds normal. No respiratory distress. She exhibits no tenderness.  Abdominal: Soft. Normal appearance and bowel sounds are normal. There is no hepatosplenomegaly. There is no tenderness. There is no rebound, no guarding, no tenderness at McBurney's point and negative Murphy's sign. No hernia.  Musculoskeletal: Normal range of motion.  Neurological: She is alert and oriented to person, place, and time. She has normal strength. No cranial nerve deficit or sensory deficit. Coordination normal. GCS eye subscore is 4. GCS verbal subscore is 5. GCS motor subscore is 6.  Skin: Skin is warm, dry and intact. No rash noted. No cyanosis.  Psychiatric: She has a normal mood and affect. Her speech is normal and behavior is normal. Thought content normal.    ED Course  Procedures (including critical care time) DIAGNOSTIC STUDIES: Oxygen Saturation is 100% on room air, normal by my interpretation.    COORDINATION OF CARE: 8:27 PM- Patient informed of current plan for treatment and evaluation and agrees with plan at this time.      Labs Reviewed  COMPREHENSIVE METABOLIC PANEL - Abnormal; Notable for the following:    Total Bilirubin 0.1 (*)    All other components within normal limits  URINALYSIS, ROUTINE W REFLEX MICROSCOPIC - Abnormal; Notable for the following:    APPearance HAZY (*)    All other components within normal limits  CBC WITH DIFFERENTIAL   Ct Abdomen Pelvis W Contrast  12/23/2012  *RADIOLOGY REPORT*  Clinical Data: Lower abdominal pain.   CT ABDOMEN AND PELVIS WITH CONTRAST  Technique:  Multidetector CT imaging of the abdomen and pelvis was performed following the standard protocol during bolus administration of intravenous contrast.  Contrast: 50mL OMNIPAQUE IOHEXOL 300 MG/ML  SOLN, OMNIPAQUE IOHEXOL 300 MG/ML  SOLN  Comparison: 06/21/2010  Findings: The lung bases are clear.  Surgical absence of the gallbladder.  The liver, spleen, pancreas, adrenal glands, kidneys, abdominal aorta, inferior vena cava, and retroperitoneal lymph nodes are unremarkable.  The stomach, small bowel, and colon are not abnormally distended.  No focal bowel wall  thickening is appreciated in the opacified loops.  The no free air or free fluid in the abdomen.  Abdominal wall musculature appears intact.  Pelvis:  The appendix is normal.  No diverticulitis.  Surgical absence of the uterus.  The ovaries are mildly prominent in size, but are symmetrical. 25 mm septated cyst inferior to the left ovary is unchanged since previous study and likely benign.  No abnormal adnexal masses are suggested.  Bladder wall is not thickened. No significant pelvic lymphadenopathy.  No free or loculated pelvic fluid collections.  Normal alignment of the lumbar vertebrae.  IMPRESSION: No acute process demonstrated in the abdomen or pelvis to account for the patient's reported symptoms.  Stable appearance of small septated cyst in the left adnexa.   Original Report Authenticated By: Burman Nieves, M.D.      Diagnosis: Abdominal pain    MDM  Patient wishes to the ER with complaints of abdominal pain and discomfort has been going on for quite sometime. Patient reports worsening in the last couple of days. She has a heaviness in the lower abdomen it worsens when she moves around or walks. She has not had nausea, vomiting, diarrhea or fever. Abdominal exam was essentially benign. She had mild tenderness without guarding, rebound or signs of acute surgical process. Patient indicates  that she has had some form of pelvic blood clot in the past found on CAT scan. A CAT scan was performed and there is no acute pathology. Patient reassured, treatment with analgesia and rest.    I personally performed the services described in this documentation, which was scribed in my presence. The recorded information has been reviewed and is accurate.    Gilda Crease, MD 12/23/12 [phone]

## 2012-12-23 NOTE — ED Notes (Signed)
Abd pain, and heavy feeling in abd. Hurts worse when up walking.  No NVD.

## 2012-12-24 NOTE — ED Notes (Signed)
Pt alert & oriented x4, stable gait. Patient given discharge instructions, paperwork & prescription(s). Patient  instructed to stop at the registration desk to finish any additional paperwork. Patient verbalized understanding. Pt left department w/ no further questions. 

## 2013-03-07 ENCOUNTER — Encounter (HOSPITAL_COMMUNITY): Payer: Self-pay

## 2013-03-07 ENCOUNTER — Emergency Department (HOSPITAL_COMMUNITY)
Admission: EM | Admit: 2013-03-07 | Discharge: 2013-03-07 | Disposition: A | Discharge status: Home / Self Care | Payer: Self-pay | Attending: Emergency Medicine | Admitting: Emergency Medicine

## 2013-03-07 DIAGNOSIS — Z8669 Personal history of other diseases of the nervous system and sense organs: Secondary | ICD-10-CM | POA: Insufficient documentation

## 2013-03-07 DIAGNOSIS — Y939 Activity, unspecified: Secondary | ICD-10-CM | POA: Insufficient documentation

## 2013-03-07 DIAGNOSIS — I10 Essential (primary) hypertension: Secondary | ICD-10-CM | POA: Insufficient documentation

## 2013-03-07 DIAGNOSIS — J45901 Unspecified asthma with (acute) exacerbation: Secondary | ICD-10-CM | POA: Insufficient documentation

## 2013-03-07 DIAGNOSIS — Z79899 Other long term (current) drug therapy: Secondary | ICD-10-CM | POA: Insufficient documentation

## 2013-03-07 DIAGNOSIS — M545 Low back pain, unspecified: Secondary | ICD-10-CM

## 2013-03-07 DIAGNOSIS — Y929 Unspecified place or not applicable: Secondary | ICD-10-CM | POA: Insufficient documentation

## 2013-03-07 DIAGNOSIS — Z8719 Personal history of other diseases of the digestive system: Secondary | ICD-10-CM | POA: Insufficient documentation

## 2013-03-07 DIAGNOSIS — IMO0002 Reserved for concepts with insufficient information to code with codable children: Secondary | ICD-10-CM | POA: Insufficient documentation

## 2013-03-07 DIAGNOSIS — T148XXA Other injury of unspecified body region, initial encounter: Secondary | ICD-10-CM

## 2013-03-07 DIAGNOSIS — X500XXA Overexertion from strenuous movement or load, initial encounter: Secondary | ICD-10-CM | POA: Insufficient documentation

## 2013-03-07 MED ORDER — KETOROLAC TROMETHAMINE 60 MG/2ML IM SOLN
60.0000 mg | Freq: Once | INTRAMUSCULAR | Status: AC
  Administered 2013-03-07: 60 mg via INTRAMUSCULAR
  Filled 2013-03-07: qty 2

## 2013-03-07 MED ORDER — METHOCARBAMOL 500 MG PO TABS: ORAL_TABLET | ORAL | Status: DC

## 2013-03-07 MED ORDER — HYDROCODONE-ACETAMINOPHEN 7.5-325 MG PO TABS: 1.0000 | ORAL_TABLET | ORAL | Status: DC | PRN

## 2013-03-07 MED ORDER — DEXAMETHASONE SODIUM PHOSPHATE 4 MG/ML IJ SOLN
8.0000 mg | Freq: Once | INTRAMUSCULAR | Status: AC
  Administered 2013-03-07: 8 mg via INTRAMUSCULAR
  Filled 2013-03-07: qty 2

## 2013-03-07 MED ORDER — MELOXICAM 7.5 MG PO TABS: ORAL_TABLET | ORAL | Status: DC

## 2013-03-07 NOTE — ED Notes (Signed)
Pt states she twisted her lower back yesterday

## 2013-03-07 NOTE — ED Provider Notes (Signed)
History     CSN: [id number]  Arrival date & time 03/07/13  8657   First MD Initiated Contact with Patient 03/07/13 [phone]      Chief Complaint  Patient presents with  . Back Pain    (Consider location/radiation/quality/duration/timing/severity/associated sxs/prior treatment) Patient is a 37 y.o. female presenting with back pain. The history is provided by the patient.  Back Pain Location:  Lumbar spine Quality:  Shooting, cramping and aching Radiates to:  Does not radiate Pain severity:  Moderate Pain is:  Same all the time Onset quality:  Gradual Duration:  1 day Timing:  Intermittent Progression:  Worsening Chronicity:  New Context: twisting   Relieved by:  Nothing Worsened by:  Nothing tried Ineffective treatments:  Heating pad Associated symptoms: no abdominal pain, no bladder incontinence, no bowel incontinence, no chest pain, no dysuria and no perianal numbness     Past Medical History  Diagnosis Date  . Pseudotumor cerebri   . Asthma   . GERD (gastroesophageal reflux disease)   . Hypertension     Past Surgical History  Procedure Laterality Date  . Cholecystectomy    . Achilles tendon lengthening    . Abdominal hysterectomy    . Lumbar puncture  12/04/2011    dr. Gerilyn Pilgrim    Family History  Problem Relation Age of Onset  . Migraines Mother   . Cancer Maternal Uncle   . Cancer Maternal Uncle   . Cancer Paternal Grandmother   . Cancer Paternal Grandfather     History  Substance Use Topics  . Smoking status: Never Smoker   . Smokeless tobacco: Never Used  . Alcohol Use: No    OB History   Grav Para Term Preterm Abortions TAB SAB Ect Mult Living   3 3 3       3       Review of Systems  Constitutional: Negative for activity change.       All ROS Neg except as noted in HPI  HENT: Negative for nosebleeds and neck pain.   Eyes: Negative for photophobia and discharge.  Respiratory: Positive for wheezing. Negative for cough and shortness of breath.    Cardiovascular: Negative for chest pain and palpitations.  Gastrointestinal: Negative for abdominal pain, blood in stool and bowel incontinence.  Genitourinary: Negative for bladder incontinence, dysuria, frequency and hematuria.  Musculoskeletal: Positive for back pain. Negative for arthralgias.  Skin: Negative.   Neurological: Negative for dizziness, seizures and speech difficulty.  Psychiatric/Behavioral: Negative for hallucinations and confusion.    Allergies  Review of patient's allergies indicates no known allergies.  Home Medications   Current Outpatient Rx  Name  Route  Sig  Dispense  Refill  . acetaZOLAMIDE (DIAMOX) 250 MG tablet   Oral   Take 250 mg by mouth 2 (two) times daily.         Marland Kitchen albuterol (PROVENTIL HFA;VENTOLIN HFA) 108 (90 BASE) MCG/ACT inhaler   Inhalation   Inhale 2 puffs into the lungs every 4 (four) hours as needed. For shortness of breath         . ALPRAZolam (XANAX) 1 MG tablet   Oral   Take 1 mg by mouth 4 (four) times daily as needed. For anxiety         . budesonide-formoterol (SYMBICORT) 80-4.5 MCG/ACT inhaler   Inhalation   Inhale 2 puffs into the lungs 2 (two) times daily.           Marland Kitchen HYDROcodone-acetaminophen (NORCO/VICODIN) 5-325 MG per tablet  Oral   Take 1 tablet by mouth every 6 (six) hours as needed for pain.         Marland Kitchen PRESCRIPTION MEDICATION   Intramuscular   Inject 1 each into the muscle every 21 ( twenty-one) days. lipoplex injection         . topiramate (TOPAMAX) 25 MG tablet   Oral   Take 25 mg by mouth 2 (two) times daily.           BP 140/93  Pulse 75  Temp(Src) 98.7 F (37.1 C) (Oral)  Resp 18  Ht 5\' 10"  (1.778 m)  Wt 198 lb (89.812 kg)  BMI 28.41 kg/m2  SpO2 100%  LMP 11/11/2011  Physical Exam  Nursing note and vitals reviewed. Constitutional: She is oriented to person, place, and time. She appears well-developed and well-nourished.  Non-toxic appearance.  HENT:  Head: Normocephalic.   Right Ear: Tympanic membrane and external ear normal.  Left Ear: Tympanic membrane and external ear normal.  Eyes: EOM and lids are normal. Pupils are equal, round, and reactive to light.  Neck: Normal range of motion. Neck supple. Carotid bruit is not present.  Cardiovascular: Normal rate, regular rhythm, normal heart sounds, intact distal pulses and normal pulses.   Pulmonary/Chest: Breath sounds normal. No respiratory distress.  Abdominal: Soft. Bowel sounds are normal. There is no tenderness. There is no guarding.  Musculoskeletal:       Lumbar back: She exhibits decreased range of motion, tenderness and spasm.  Lymphadenopathy:       Head (right side): No submandibular adenopathy present.       Head (left side): No submandibular adenopathy present.    She has no cervical adenopathy.  Neurological: She is alert and oriented to person, place, and time. She has normal strength. No cranial nerve deficit or sensory deficit.  Skin: Skin is warm and dry.  Psychiatric: She has a normal mood and affect. Her speech is normal.    ED Course  Procedures (including critical care time)  Labs Reviewed - No data to display No results found.   No diagnosis found.    MDM  I have reviewed nursing notes, vital signs, and all appropriate lab and imaging results for this patient. Pt made a sudden turn while chasing her dog yesterday. She has had pain since that time. No gross neuro deficit. Gait is steady by slow due to pain.  Plan: Rest, heat to the back, decadron, robaxin, and norco for pain. Pt to see her MD for recheck and possible MRI if not improving.       Kathie Dike, PA-C 03/07/13 1003

## 2013-03-07 NOTE — ED Provider Notes (Signed)
History/physical exam/procedure(s) were performed by non-physician practitioner and as supervising physician I was immediately available for consultation/collaboration. I have reviewed all notes and am in agreement with care and plan.   Hilario Quarry, MD 03/07/13 1556

## 2013-05-11 ENCOUNTER — Emergency Department (HOSPITAL_COMMUNITY): Payer: 59

## 2013-05-11 ENCOUNTER — Encounter (HOSPITAL_COMMUNITY): Payer: Self-pay | Admitting: Emergency Medicine

## 2013-05-11 ENCOUNTER — Emergency Department (HOSPITAL_COMMUNITY)
Admission: EM | Admit: 2013-05-11 | Discharge: 2013-05-11 | Disposition: A | Discharge status: Home / Self Care | Payer: 59 | Attending: Emergency Medicine | Admitting: Emergency Medicine

## 2013-05-11 DIAGNOSIS — Z79899 Other long term (current) drug therapy: Secondary | ICD-10-CM | POA: Insufficient documentation

## 2013-05-11 DIAGNOSIS — R109 Unspecified abdominal pain: Secondary | ICD-10-CM | POA: Insufficient documentation

## 2013-05-11 DIAGNOSIS — G932 Benign intracranial hypertension: Secondary | ICD-10-CM | POA: Insufficient documentation

## 2013-05-11 DIAGNOSIS — Z8719 Personal history of other diseases of the digestive system: Secondary | ICD-10-CM | POA: Insufficient documentation

## 2013-05-11 DIAGNOSIS — I10 Essential (primary) hypertension: Secondary | ICD-10-CM | POA: Insufficient documentation

## 2013-05-11 DIAGNOSIS — R112 Nausea with vomiting, unspecified: Secondary | ICD-10-CM | POA: Insufficient documentation

## 2013-05-11 DIAGNOSIS — R1084 Generalized abdominal pain: Secondary | ICD-10-CM

## 2013-05-11 DIAGNOSIS — R51 Headache: Secondary | ICD-10-CM | POA: Insufficient documentation

## 2013-05-11 DIAGNOSIS — J45909 Unspecified asthma, uncomplicated: Secondary | ICD-10-CM | POA: Insufficient documentation

## 2013-05-11 DIAGNOSIS — Z3202 Encounter for pregnancy test, result negative: Secondary | ICD-10-CM | POA: Insufficient documentation

## 2013-05-11 DIAGNOSIS — R1110 Vomiting, unspecified: Secondary | ICD-10-CM | POA: Insufficient documentation

## 2013-05-11 DIAGNOSIS — N949 Unspecified condition associated with female genital organs and menstrual cycle: Secondary | ICD-10-CM | POA: Insufficient documentation

## 2013-05-11 LAB — CBC WITH DIFFERENTIAL/PLATELET
Basophils Absolute: 0 10*3/uL (ref 0.0–0.1)
Eosinophils Relative: 7 % — ABNORMAL HIGH (ref 0–5)
Lymphocytes Relative: 34 % (ref 12–46)
MCV: 86.6 fL (ref 78.0–100.0)
Neutro Abs: 3.5 10*3/uL (ref 1.7–7.7)
Neutrophils Relative %: 53 % (ref 43–77)
Platelets: 298 10*3/uL (ref 150–400)
RBC: 4.49 MIL/uL (ref 3.87–5.11)
RDW: 13.4 % (ref 11.5–15.5)
WBC: 6.5 10*3/uL (ref 4.0–10.5)

## 2013-05-11 LAB — URINALYSIS, ROUTINE W REFLEX MICROSCOPIC
Bilirubin Urine: NEGATIVE
Hgb urine dipstick: NEGATIVE
Specific Gravity, Urine: 1.024 (ref 1.005–1.030)
pH: 6.5 (ref 5.0–8.0)

## 2013-05-11 LAB — COMPREHENSIVE METABOLIC PANEL
ALT: 12 U/L (ref 0–35)
AST: 16 U/L (ref 0–37)
Alkaline Phosphatase: 47 U/L (ref 39–117)
CO2: 28 mEq/L (ref 19–32)
Calcium: 8.7 mg/dL (ref 8.4–10.5)
GFR calc non Af Amer: >90 mL/min (ref >90)
Potassium: 3.6 mEq/L (ref 3.5–5.1)
Sodium: 138 mEq/L (ref 135–145)
Total Protein: 6.7 g/dL (ref 6.0–8.3)

## 2013-05-11 LAB — POCT PREGNANCY, URINE: Preg Test, Ur: NEGATIVE

## 2013-05-11 LAB — WET PREP, GENITAL: Yeast Wet Prep HPF POC: NONE SEEN

## 2013-05-11 MED ORDER — FENTANYL CITRATE 0.05 MG/ML IJ SOLN
100.0000 ug | Freq: Once | INTRAMUSCULAR | Status: AC
  Administered 2013-05-11: 100 ug via INTRAVENOUS
  Filled 2013-05-11: qty 2

## 2013-05-11 MED ORDER — ONDANSETRON HCL 4 MG/2ML IJ SOLN: 4.0000 mg | Freq: Once | INTRAMUSCULAR | Status: DC

## 2013-05-11 MED ORDER — ONDANSETRON 8 MG PO TBDP: ORAL_TABLET | ORAL | Status: DC

## 2013-05-11 MED ORDER — ONDANSETRON HCL 4 MG/2ML IJ SOLN
4.0000 mg | Freq: Once | INTRAMUSCULAR | Status: AC
  Administered 2013-05-11: 4 mg via INTRAVENOUS
  Filled 2013-05-11: qty 2

## 2013-05-11 MED ORDER — OXYCODONE-ACETAMINOPHEN 5-325 MG PO TABS: 1.0000 | ORAL_TABLET | ORAL | Status: DC | PRN

## 2013-05-11 MED ORDER — IOHEXOL 300 MG/ML  SOLN
25.0000 mL | INTRAMUSCULAR | Status: AC
  Administered 2013-05-11: 25 mL via ORAL

## 2013-05-11 MED ORDER — MORPHINE SULFATE 4 MG/ML IJ SOLN
4.0000 mg | Freq: Once | INTRAMUSCULAR | Status: AC
  Administered 2013-05-11: 4 mg via INTRAVENOUS
  Filled 2013-05-11: qty 1

## 2013-05-11 MED ORDER — IOHEXOL 300 MG/ML  SOLN
100.0000 mL | Freq: Once | INTRAMUSCULAR | Status: AC | PRN
  Administered 2013-05-11: 100 mL via INTRAVENOUS

## 2013-05-11 MED ORDER — MORPHINE SULFATE 4 MG/ML IJ SOLN: 4.0000 mg | Freq: Once | INTRAMUSCULAR | Status: DC

## 2013-05-11 NOTE — ED Provider Notes (Signed)
CSN: [id number]     Arrival date & time 05/11/13  0759 History     First MD Initiated Contact with Patient 05/11/13 0840     Chief Complaint  Patient presents with  . Emesis  . Headache   (Consider location/radiation/quality/duration/timing/severity/associated sxs/prior Treatment) HPI Comments: Patient is a 37 year old female with history of pseudotumor cerebri, Asthma, GERD, hypertension who presents today with 3 weeks of worsening lower abdominal pain. She describes it as feeling as though "she just torn up inside". The pain is intermittent, but never totally goes away. When the pain is at its worst, it lasts for approximately 1 hour and resolves spontaneously. She began to have nonbloody emesis on Monday. She states she's not been able to tolerate any food or liquid since that time. She denies diarrhea. Last normal bowel movement was last night. She also has a headache and feels generally weak. She admits to associated lightheadedness. No fever, chills, vaginal discharge, urinary frequency, urinary urgency, dysuria.   The history is provided by the patient. No language interpreter was used.    Past Medical History  Diagnosis Date  . Pseudotumor cerebri   . Asthma   . GERD (gastroesophageal reflux disease)   . Hypertension    Past Surgical History  Procedure Laterality Date  . Cholecystectomy    . Achilles tendon lengthening    . Abdominal hysterectomy    . Lumbar puncture  12/04/2011    dr. Gerilyn Pilgrim   Family History  Problem Relation Age of Onset  . Migraines Mother   . Cancer Maternal Uncle   . Cancer Maternal Uncle   . Cancer Paternal Grandmother   . Cancer Paternal Grandfather    History  Substance Use Topics  . Smoking status: Never Smoker   . Smokeless tobacco: Never Used  . Alcohol Use: No   OB History   Grav Para Term Preterm Abortions TAB SAB Ect Mult Living   3 3 3       3      Review of Systems  Constitutional: Negative for fever and chills.   Respiratory: Negative for shortness of breath.   Cardiovascular: Negative for chest pain.  Gastrointestinal: Positive for nausea and vomiting. Negative for diarrhea and constipation.  Genitourinary: Positive for pelvic pain. Negative for vaginal discharge.  Neurological: Positive for headaches.  All other systems reviewed and are negative.    Allergies  Review of patient's allergies indicates no known allergies.  Home Medications   Current Outpatient Rx  Name  Route  Sig  Dispense  Refill  . acetaZOLAMIDE (DIAMOX) 250 MG tablet   Oral   Take 250 mg by mouth 2 (two) times daily.         Marland Kitchen albuterol (PROVENTIL HFA;VENTOLIN HFA) 108 (90 BASE) MCG/ACT inhaler   Inhalation   Inhale 2 puffs into the lungs every 4 (four) hours as needed. For shortness of breath         . ALPRAZolam (XANAX) 1 MG tablet   Oral   Take 1 mg by mouth 4 (four) times daily as needed. For anxiety         . budesonide-formoterol (SYMBICORT) 80-4.5 MCG/ACT inhaler   Inhalation   Inhale 2 puffs into the lungs 2 (two) times daily.           Marland Kitchen HYDROcodone-acetaminophen (NORCO) 7.5-325 MG per tablet   Oral   Take 1 tablet by mouth every 4 (four) hours as needed for pain.   20 tablet   0   .  HYDROcodone-acetaminophen (NORCO/VICODIN) 5-325 MG per tablet   Oral   Take 1 tablet by mouth every 6 (six) hours as needed for pain.         . meloxicam (MOBIC) 7.5 MG tablet      1 po bid with food   12 tablet   0   . methocarbamol (ROBAXIN) 500 MG tablet      2 po tid for spasm/pain   30 tablet   0   . PRESCRIPTION MEDICATION   Intramuscular   Inject 1 each into the muscle every 21 ( twenty-one) days. lipoplex injection         . topiramate (TOPAMAX) 25 MG tablet   Oral   Take 25 mg by mouth 2 (two) times daily.          BP 139/95  Pulse 73  Temp(Src) 98.6 F (37 C)  Resp 16  SpO2 98%  LMP 11/11/2011 Physical Exam  Nursing note and vitals reviewed. Constitutional: She is  oriented to person, place, and time. She appears well-developed and well-nourished. She does not appear ill. No distress.  HENT:  Head: Normocephalic and atraumatic.  Right Ear: External ear normal.  Left Ear: External ear normal.  Nose: Nose normal.  Mouth/Throat: Oropharynx is clear and moist.  Eyes: Conjunctivae are normal.  Neck: Normal range of motion.  Cardiovascular: Normal rate, regular rhythm and normal heart sounds.   Pulmonary/Chest: Effort normal and breath sounds normal. No stridor. No respiratory distress. She has no wheezes. She has no rales.  Abdominal: Soft. She exhibits no distension. There is generalized tenderness. There is no rigidity, no rebound and no guarding.  Generalized abdominal tenderness - worst in the lower quadrants  Genitourinary: Pelvic exam was performed with patient supine. Cervix exhibits discharge (white). Cervix exhibits no motion tenderness and no friability. Right adnexum displays tenderness. Right adnexum displays no mass and no fullness. Left adnexum displays tenderness. Left adnexum displays no mass and no fullness.  Musculoskeletal: Normal range of motion.  Neurological: She is alert and oriented to person, place, and time. She has normal strength.  Skin: Skin is warm and dry. She is not diaphoretic. No erythema.  Psychiatric: She has a normal mood and affect. Her behavior is normal.    ED Course   Procedures (including critical care time)  Labs Reviewed  CBC WITH DIFFERENTIAL - Abnormal; Notable for the following:    Eosinophils Relative 7 (*)    All other components within normal limits  COMPREHENSIVE METABOLIC PANEL - Abnormal; Notable for the following:    Albumin 3.3 (*)    Total Bilirubin 0.1 (*)    All other components within normal limits  WET PREP, GENITAL  GC/CHLAMYDIA PROBE AMP  URINALYSIS, ROUTINE W REFLEX MICROSCOPIC  LIPASE, BLOOD  POCT PREGNANCY, URINE   No results found. No diagnosis found.  MDM  Patient left AMA  prior to pelvic ultrasound due to receiving some bad news. At this point I am not concerned for emergent etiology of her pathology, however her workup was not complete. At this point PID, ectopic pregnancy have been ruled out. Per the nurse, the patient stated plans on returning to the emergency department later this afternoon. Vital signs stable.   Mora Bellman, PA-C 05/11/13 1017

## 2013-05-11 NOTE — ED Notes (Signed)
Hannah, PA at the bedside.  

## 2013-05-11 NOTE — ED Notes (Signed)
Patient transported to CT 

## 2013-05-11 NOTE — ED Provider Notes (Signed)
CSN: [id number]     Arrival date & time 05/11/13  1222 History     First MD Initiated Contact with Patient 05/11/13 1312     Chief Complaint  Patient presents with  . Headache  . Vomiting   (Consider location/radiation/quality/duration/timing/severity/associated sxs/prior Treatment) The history is provided by the patient and medical records.   Patient was sent to the ED for abdominal pain and headache. Patient was seen earlier this morning and left AMA for personal reasons before workup was completed. She now returns because her symptoms have not resolved.  Abdominal pain present for 3 weeks, described as constant, sharp, with a sensation "that her insides are ripping apart."  Pain mostly localized to lower abdomen with occasional radiation to the upper quadrants.  No alleviating or exacerbating factors.  Sx associated with some nausea with 1 episode of non-bloody, non-bilious vomiting.  BM vary between loose/hard, but remain non-bloody.  No recent fevers, sweats, or chills.  No sick contacts.  No urinary sx or vaginal discharge.  Pt had partial hysterectomy many years ago-- still has both ovaries.  No other abdominal surgeries.  Pt does not currently have an OB-GYN.  Pt also complains of generalized headache.  Headache not associated with photophobia, phonophobia, neck pain, visual disturbance, confusion, slurred speech, tinnitus, or changes in mental status.  Pt states "i think i am just frustrated because no one can tell me what is going on with my stomach."  States she took 2 BC powders yesterday without relief.  No meds taken today.  Pt has hx of pseudotumor cerebri, last LP 9 months ago.  Past Medical History  Diagnosis Date  . Pseudotumor cerebri   . Asthma   . GERD (gastroesophageal reflux disease)   . Hypertension    Past Surgical History  Procedure Laterality Date  . Cholecystectomy    . Achilles tendon lengthening    . Abdominal hysterectomy    . Lumbar puncture  12/04/2011   dr. Gerilyn Pilgrim   Family History  Problem Relation Age of Onset  . Migraines Mother   . Cancer Maternal Uncle   . Cancer Maternal Uncle   . Cancer Paternal Grandmother   . Cancer Paternal Grandfather    History  Substance Use Topics  . Smoking status: Never Smoker   . Smokeless tobacco: Never Used  . Alcohol Use: No   OB History   Grav Para Term Preterm Abortions TAB SAB Ect Mult Living   3 3 3       3      Review of Systems  Gastrointestinal: Positive for abdominal pain.  Neurological: Positive for headaches.  All other systems reviewed and are negative.    Allergies  Review of patient's allergies indicates no known allergies.  Home Medications   Current Outpatient Rx  Name  Route  Sig  Dispense  Refill  . albuterol (PROVENTIL HFA;VENTOLIN HFA) 108 (90 BASE) MCG/ACT inhaler   Inhalation   Inhale 2 puffs into the lungs every 4 (four) hours as needed for wheezing or shortness of breath. For shortness of breath         . ALPRAZolam (XANAX) 1 MG tablet   Oral   Take 1 mg by mouth 4 (four) times daily as needed for sleep or anxiety. For anxiety         . Aspirin-Caffeine (BC FAST PAIN RELIEF PO)   Oral   Take 2 Packages by mouth daily as needed. For headache         .  budesonide-formoterol (SYMBICORT) 80-4.5 MCG/ACT inhaler   Inhalation   Inhale 2 puffs into the lungs 2 (two) times daily.           BP 143/80  Pulse 78  Temp(Src) 98.4 F (36.9 C) (Oral)  Resp 18  SpO2 98%  LMP 11/11/2011  Physical Exam  Nursing note and vitals reviewed. Constitutional: She is oriented to person, place, and time. She appears well-developed and well-nourished.  HENT:  Head: Normocephalic and atraumatic.  Mouth/Throat: Oropharynx is clear and moist.  Eyes: Conjunctivae and EOM are normal. Pupils are equal, round, and reactive to light.  Neck: Normal range of motion.  Cardiovascular: Normal rate, regular rhythm and normal heart sounds.   Pulmonary/Chest: Effort normal  and breath sounds normal.  Abdominal: Soft. Bowel sounds are normal. There is tenderness in the right lower quadrant and left lower quadrant. There is no guarding, no CVA tenderness, no tenderness at McBurney's point and negative Murphy's sign.  Musculoskeletal: Normal range of motion.  Neurological: She is alert and oriented to person, place, and time. She has normal strength. She displays no tremor. No cranial nerve deficit or sensory deficit. She displays no seizure activity.  CN grossly intact, moves all extremities appropriately without ataxia, no focal neuro deficits or facial droop appreciated  Skin: Skin is warm and dry.  Psychiatric: She has a normal mood and affect.    ED Course   Procedures (including critical care time)  Labs Reviewed  CBC WITH DIFFERENTIAL - Abnormal; Notable for the following:    Eosinophils Relative  7 (*)     All other components within normal limits   COMPREHENSIVE METABOLIC PANEL - Abnormal; Notable for the following:    Albumin  3.3 (*)     Total Bilirubin  0.1 (*)     All other components within normal limits   WET PREP, GENITAL   GC/CHLAMYDIA PROBE AMP   URINALYSIS, ROUTINE W REFLEX MICROSCOPIC   LIPASE, BLOOD   POCT PREGNANCY, URINE    No results found.  No diagnosis found.  No results found. No diagnosis found.  MDM   Labs and pelvic performed this am as above-- no significant abnormalities.  Pt appears far more concerned about abodominal pain and headache is without associated neuro deficits-- I do not suspect SAH, ICH, TIA, stroke, or meningitis and will defer imaging at this time.  CT pending at this.  Care signed out to PA Muthersbaugh at shift change.  Garlon Hatchet, PA-C 05/11/13 1615

## 2013-05-11 NOTE — ED Notes (Signed)
Urine specimen collected in triage by cna and sent down to lab after performing POCT pregnancy test

## 2013-05-11 NOTE — ED Provider Notes (Signed)
Pt care assumed from Sharilyn Sites, PA-C.  [name] is a 37 y.o. female was seen this AM (by Junious Silk, PA-C) for abd pain x 3 weeks and then left AMA.  Generalized abd pain describing it at her "insides being tore up."  Pt labs not repeated, pelvic unequivocal.  Pt with HX of partial hysterectomy with remaining ovaries.  Pt with hx of blood clot in abd (age 86).  Hx of cholecystectomy.  Pt also c/o headache for which she sees [place] radiology.  With hx of pseudotumor cerebri but no papilledema or changes in vision today.  Last LP 9 mos ago.    Plan: CT abd/pelvis pending.  If negative, d/c home with OB/GYN and or GI f/u.  Pt also to f/u for therapeutic LP.     Face to face Exam:   General: Awake  HEENT: Atraumatic, no papilledema  Resp: Normal effort  Abd: Nondistended, mild generalized tenderness Neuro:Speech is clear and goal oriented, follows commands; Major Cranial nerves without deficit, no facial droop; Normal strength in upper and lower extremities bilaterally including dorsiflexion and plantar flexion, strong and equal grip strength; Sensation normal to light and sharp touch; Moves extremities without ataxia, coordination intact; Normal gait and balance Lymph: No head or cervical adenopathy   4:45 PM CT with probable collapsing ovarian cyst, possibly corpus luteum cyst on the right with the appendix and terminal ileum appearing normal.  I personally reviewed the imaging tests through PACS system.  I reviewed available ER/hospitalization records through the EMR.  Pt states her headache is persistent and worsening over the last several weeks but is refusing an LP today.  Discussed the importance of a therapeutic LP in resolution of her headache. Also discussed the importance of opthalmology f/u as well.  I have given explicit precautions to return to the ER including for any other new or worsening symptoms. The patient understands and accepts the medical plan as it's been discussed  and I have answered all questions. Discharge instructions concerning home care and prescriptions have been given. The patient is STABLE and is discharged to home in good condition.      Dahlia Client Annakate Soulier, PA-C 05/11/13 1702

## 2013-05-11 NOTE — ED Notes (Addendum)
Lower abd pain x 3 weeks  and vomiting started Monday also has h/a and feels weak

## 2013-05-11 NOTE — ED Notes (Signed)
Pt left in a hurry before signing out AMA

## 2013-05-11 NOTE — ED Notes (Signed)
Pt stating she just got some bad news and has to leave. IV removed and pt leaving.

## 2013-05-11 NOTE — ED Notes (Signed)
Pt sts she was here this am for same and had to leave; pt sts N/V x 3 days and HA x 3 weeks

## 2013-05-12 ENCOUNTER — Encounter (HOSPITAL_COMMUNITY): Payer: Self-pay | Admitting: *Deleted

## 2013-05-12 ENCOUNTER — Inpatient Hospital Stay (HOSPITAL_COMMUNITY): Payer: 59

## 2013-05-12 ENCOUNTER — Inpatient Hospital Stay (HOSPITAL_COMMUNITY)
Admission: AD | Admit: 2013-05-12 | Discharge: 2013-05-12 | Disposition: A | Discharge status: Home / Self Care | Payer: 59 | Source: Ambulatory Visit | Attending: Obstetrics & Gynecology | Admitting: Obstetrics & Gynecology

## 2013-05-12 DIAGNOSIS — N7013 Chronic salpingitis and oophoritis: Secondary | ICD-10-CM | POA: Insufficient documentation

## 2013-05-12 DIAGNOSIS — R112 Nausea with vomiting, unspecified: Secondary | ICD-10-CM | POA: Insufficient documentation

## 2013-05-12 DIAGNOSIS — R109 Unspecified abdominal pain: Secondary | ICD-10-CM | POA: Insufficient documentation

## 2013-05-12 DIAGNOSIS — N949 Unspecified condition associated with female genital organs and menstrual cycle: Secondary | ICD-10-CM | POA: Insufficient documentation

## 2013-05-12 DIAGNOSIS — N7011 Chronic salpingitis: Secondary | ICD-10-CM

## 2013-05-12 LAB — GC/CHLAMYDIA PROBE AMP
CT Probe RNA: NEGATIVE
GC Probe RNA: NEGATIVE

## 2013-05-12 MED ORDER — OXYCODONE-ACETAMINOPHEN 5-325 MG PO TABS: 2.0000 | ORAL_TABLET | ORAL | Status: DC | PRN

## 2013-05-12 MED ORDER — AZITHROMYCIN 250 MG PO TABS
1000.0000 mg | ORAL_TABLET | Freq: Once | ORAL | Status: AC
  Administered 2013-05-12: 1000 mg via ORAL
  Filled 2013-05-12: qty 4

## 2013-05-12 MED ORDER — CEFTRIAXONE SODIUM 250 MG IJ SOLR
250.0000 mg | Freq: Once | INTRAMUSCULAR | Status: AC
  Administered 2013-05-12: 250 mg via INTRAMUSCULAR
  Filled 2013-05-12: qty 250

## 2013-05-12 MED ORDER — OXYCODONE-ACETAMINOPHEN 5-325 MG PO TABS
2.0000 | ORAL_TABLET | Freq: Once | ORAL | Status: AC
  Administered 2013-05-12: 2 via ORAL
  Filled 2013-05-12: qty 2

## 2013-05-12 MED ORDER — KETOROLAC TROMETHAMINE 60 MG/2ML IM SOLN
60.0000 mg | Freq: Once | INTRAMUSCULAR | Status: AC
  Administered 2013-05-12: 60 mg via INTRAMUSCULAR
  Filled 2013-05-12: qty 2

## 2013-05-12 NOTE — ED Provider Notes (Signed)
Medical screening examination/treatment/procedure(s) were performed by non-physician practitioner and as supervising physician I was immediately available for consultation/collaboration.    Nelia Shi, MD 05/12/13 [phone]

## 2013-05-12 NOTE — MAU Provider Note (Signed)
History     CSN: [id number]  Arrival date and time: 05/12/13 4098   None     Chief Complaint  Patient presents with  . Abdominal Pain   HPI Pt is not pregnant and presents with worsening lower abdominal pain for 3 weeks. Pt was seen yesterday at [facility] ED and had a CT scan which showed collapsing ovarian cyst. Pt also had negative GC/chlamydia culture and negative wet prep.  Pt was told to f/u in the GYN clinic but pain has gotten worse and she was then told to come to MAU for further evaluation. Pt states that her insides feel like they are torned up.  Pt's pain 1 hr duration and resolves spontaneously.  Pt states she started having nonbloody emesis Monday.  Pt has not diarrhea.  Pt has hx of pseudotumor cerebi. Pt has frequent therapeutic LP and was advised to that that repeated.   Rn note: Market researcher Addendum  MAU Note Service date: 05/12/2013 9:32 AM   Has been having pain in lower abd for a while. Started having n/v on Sunday. Went to [facility] yesterday, had a CT scan., 'fluid on ovaries'. Referred to GYN clinic, called there and was told to come up here that we could take care of it    Past Medical History  Diagnosis Date  . Pseudotumor cerebri   . Asthma   . GERD (gastroesophageal reflux disease)   . Hypertension     Past Surgical History  Procedure Laterality Date  . Cholecystectomy    . Achilles tendon lengthening    . Abdominal hysterectomy    . Lumbar puncture  12/04/2011    dr. Gerilyn Pilgrim    Family History  Problem Relation Age of Onset  . Migraines Mother   . Cancer Maternal Uncle   . Cancer Maternal Uncle   . Cancer Paternal Grandmother   . Cancer Paternal Grandfather     History  Substance Use Topics  . Smoking status: Never Smoker   . Smokeless tobacco: Never Used  . Alcohol Use: No    Allergies: No Known Allergies  Prescriptions prior to admission  Medication Sig Dispense Refill  . albuterol (PROVENTIL HFA;VENTOLIN HFA) 108 (90 BASE) MCG/ACT  inhaler Inhale 2 puffs into the lungs every 4 (four) hours as needed for wheezing or shortness of breath. For shortness of breath      . ALPRAZolam (XANAX) 1 MG tablet Take 1 mg by mouth 4 (four) times daily as needed for sleep or anxiety. For anxiety      . Aspirin-Caffeine (BC FAST PAIN RELIEF PO) Take 2 Packages by mouth daily as needed. For headache      . budesonide-formoterol (SYMBICORT) 80-4.5 MCG/ACT inhaler Inhale 2 puffs into the lungs 2 (two) times daily.       . ondansetron (ZOFRAN ODT) 8 MG disintegrating tablet 8mg  ODT q4 hours prn nausea  6 tablet  0  . oxyCODONE-acetaminophen (PERCOCET/ROXICET) 5-325 MG per tablet Take 1-2 tablets by mouth every 4 (four) hours as needed for pain.  6 tablet  0    Review of Systems  Constitutional: Negative for fever and chills.  Gastrointestinal: Positive for nausea, vomiting and abdominal pain. Negative for heartburn, diarrhea and constipation.  Genitourinary: Negative for dysuria and urgency.   Physical Exam   Blood pressure 148/87, pulse 75, temperature 98.2 F (36.8 C), temperature source Oral, resp. rate 18, weight 105.688 kg (233 lb), last menstrual period 11/11/2011.  Physical Exam  Nursing note and vitals  reviewed. Constitutional: She is oriented to person, place, and time. She appears well-developed and well-nourished. No distress.  HENT:  Head: Normocephalic and atraumatic.  Eyes: Pupils are equal, round, and reactive to light.  Neck: Normal range of motion. Neck supple.  Cardiovascular: Normal rate.   Respiratory: Effort normal.  GI: Soft. She exhibits no distension. There is tenderness. There is no rebound and no guarding.  Musculoskeletal: Normal range of motion.  Neurological: She is alert and oriented to person, place, and time.  Skin: Skin is warm and dry.  Psychiatric: She has a normal mood and affect.    MAU Course  Procedures      US Transvaginal Non-ob  05/12/2013   *RADIOLOGY REPORT*  Clinical Data: Pelvic  pain.  TRANSABDOMINAL AND TRANSVAGINAL ULTRASOUND OF PELVIS Technique:  Both transabdominal and transvaginal ultrasound examinations of the pelvis were performed. Transabdominal technique was performed for global imaging of the pelvis including uterus, ovaries, adnexal regions, and pelvic cul-de-sac.  It was necessary to proceed with endovaginal exam following the transabdominal exam to visualize the ovaries/adnexa.  Comparison:  CT abdomen pelvis 05/11/2013  Findings:  Uterus: Surgically absent.  Endometrium: Surgically absent.  Right ovary:  The right ovary measures 5.4 x 3.3 x 3.2 cm and contains a probable corpus luteum and several follicles.  There is no cyst or suspicious mass.  Color Doppler flow is identified to the right ovary.  Left ovary: The left ovary measures 4.1 x 2.5 x 3.0 cm.  Left ovarian follicles are present.  No ovarian cyst or mass.  Color Doppler flow is seen to the left ovary.  Other findings: A simple appearing hydrosalpinx is seen in the left adnexa .A small amount of free fluid is seen in the left adnexa.  IMPRESSION: 1.  Left hydrosalpinx. 2.  Small amount of free fluid in the left adnexa. 3.  No ovarian cyst or mass.  Ovaries mildly prominent in size.   Original Report Authenticated By: Britta Mccreedy, M.D.   US Pelvis Complete  05/12/2013   *RADIOLOGY REPORT*  Clinical Data: Pelvic pain.  TRANSABDOMINAL AND TRANSVAGINAL ULTRASOUND OF PELVIS Technique:  Both transabdominal and transvaginal ultrasound examinations of the pelvis were performed. Transabdominal technique was performed for global imaging of the pelvis including uterus, ovaries, adnexal regions, and pelvic cul-de-sac.  It was necessary to proceed with endovaginal exam following the transabdominal exam to visualize the ovaries/adnexa.  Comparison:  CT abdomen pelvis 05/11/2013  Findings:  Uterus: Surgically absent.  Endometrium: Surgically absent.  Right ovary:  The right ovary measures 5.4 x 3.3 x 3.2 cm and contains a  probable corpus luteum and several follicles.  There is no cyst or suspicious mass.  Color Doppler flow is identified to the right ovary.  Left ovary: The left ovary measures 4.1 x 2.5 x 3.0 cm.  Left ovarian follicles are present.  No ovarian cyst or mass.  Color Doppler flow is seen to the left ovary.  Other findings: A simple appearing hydrosalpinx is seen in the left adnexa .A small amount of free fluid is seen in the left adnexa.  IMPRESSION: 1.  Left hydrosalpinx. 2.  Small amount of free fluid in the left adnexa. 3.  No ovarian cyst or mass.  Ovaries mildly prominent in size.   Original Report Authenticated By: Britta Mccreedy, M.D.   Ct Abdomen Pelvis W Contrast  05/11/2013   *RADIOLOGY REPORT*  Clinical Data: Abdominal pain, nausea and vomiting  CT ABDOMEN AND PELVIS WITH CONTRAST  Technique:  Multidetector CT imaging of the abdomen and pelvis was performed following the standard protocol during bolus administration of intravenous contrast.  Contrast: OMNIPAQUE IOHEXOL 300 MG/ML  SOLN  Comparison: CT abdomen pelvis of 12/23/2012  Findings: The lung bases are clear.  The liver enhances with no focal abnormality and no ductal dilatation is seen.  Surgical clips are present from prior cholecystectomy.  The pancreas appears normal in size and the peripancreatic fat planes are well preserved.  The adrenal glands and spleen are unremarkable.  The stomach is moderately fluid distended.  The kidneys enhance with no calculus or mass and no hydronephrosis is seen.  The abdominal aorta is normal in caliber.  No adenopathy is seen.  The urinary bladder is moderately distended with no abnormality noted.  The uterus has previously been resected.  The ovaries are prominent consistent with multiple follicles and there does appear to be a collapsing cyst, possibly a corpus luteum cyst on the right.  No significant free fluid is seen.  The colon is decompressed.  The terminal ileum is unremarkable.  The appendix is  visualized extending toward the midline of the pelvis with no evidence of an inflammatory process noted.  The terminal ileum also is unremarkable.  On coronal bone window images there is some sclerosis in both femoral heads and subtle early AVN is a consideration. This is more prominent than on the CT from 2011. MRI may be helpful to assess further.  IMPRESSION:  1.  There is a probable collapsing ovarian cyst, possibly corpus luteum cyst on the right which may account for patient's pain. 2.  No other explanation for the patient's pain is seen.  The appendix and terminal ileum appear normal. 3. Mild sclerosis in the femoral heads could indicate mild avascular necrosis bilaterally.  Consider MRI to assess further.   Original Report Authenticated By: Dwyane Dee, M.D.   Discussed with Dr. Debroah Loop- will  treat for hydrosalpinx and f/u with GYN clinic Rocephin 250mg  and Zithromax 1 gm tablet Percocet for pain Pt discouraged from taking BC powder and Oral Epsom Salts   Assessment and Plan  Abdominal pain Hydrosalpinx F/u in GYN clinic  [facility] 05/12/2013, 9:49 AM

## 2013-05-12 NOTE — ED Provider Notes (Signed)
  Medical screening examination/treatment/procedure(s) were performed by non-physician practitioner and as supervising physician I was immediately available for consultation/collaboration.    Coley Kulikowski, MD 05/12/13 1144 

## 2013-05-12 NOTE — MAU Note (Addendum)
Has been having pain in lower abd for a while.  Started having n/v on Sunday. Went to [facility] yesterday, had a CT scan., 'fluid on ovaries'. Referred to GYN clinic, called there and was told to come up here that we could take care of it.

## 2013-05-12 NOTE — ED Provider Notes (Signed)
Medical screening examination/treatment/procedure(s) were performed by non-physician practitioner and as supervising physician I was immediately available for consultation/collaboration.  Tenesia Escudero R. Trimaine Maser, MD 05/12/13 0112 

## 2013-05-16 ENCOUNTER — Encounter (HOSPITAL_COMMUNITY): Payer: Self-pay | Admitting: Emergency Medicine

## 2013-05-16 ENCOUNTER — Emergency Department (HOSPITAL_COMMUNITY)
Admission: EM | Admit: 2013-05-16 | Discharge: 2013-05-16 | Disposition: A | Discharge status: Home / Self Care | Payer: 59 | Attending: Emergency Medicine | Admitting: Emergency Medicine

## 2013-05-16 DIAGNOSIS — K219 Gastro-esophageal reflux disease without esophagitis: Secondary | ICD-10-CM | POA: Insufficient documentation

## 2013-05-16 DIAGNOSIS — M6281 Muscle weakness (generalized): Secondary | ICD-10-CM | POA: Insufficient documentation

## 2013-05-16 DIAGNOSIS — J45909 Unspecified asthma, uncomplicated: Secondary | ICD-10-CM | POA: Insufficient documentation

## 2013-05-16 DIAGNOSIS — R109 Unspecified abdominal pain: Secondary | ICD-10-CM | POA: Insufficient documentation

## 2013-05-16 DIAGNOSIS — Z3202 Encounter for pregnancy test, result negative: Secondary | ICD-10-CM | POA: Insufficient documentation

## 2013-05-16 DIAGNOSIS — Z79899 Other long term (current) drug therapy: Secondary | ICD-10-CM | POA: Insufficient documentation

## 2013-05-16 DIAGNOSIS — I10 Essential (primary) hypertension: Secondary | ICD-10-CM | POA: Insufficient documentation

## 2013-05-16 DIAGNOSIS — R112 Nausea with vomiting, unspecified: Secondary | ICD-10-CM | POA: Insufficient documentation

## 2013-05-16 DIAGNOSIS — G932 Benign intracranial hypertension: Secondary | ICD-10-CM | POA: Insufficient documentation

## 2013-05-16 DIAGNOSIS — R531 Weakness: Secondary | ICD-10-CM

## 2013-05-16 LAB — COMPREHENSIVE METABOLIC PANEL
Alkaline Phosphatase: 49 U/L (ref 39–117)
BUN: 11 mg/dL (ref 6–23)
CO2: 25 mEq/L (ref 19–32)
Chloride: 104 mEq/L (ref 96–112)
GFR calc Af Amer: >90 mL/min (ref >90)
Glucose, Bld: 97 mg/dL (ref 70–99)
Potassium: 3.4 mEq/L — ABNORMAL LOW (ref 3.5–5.1)
Total Bilirubin: 0.2 mg/dL — ABNORMAL LOW (ref 0.3–1.2)

## 2013-05-16 LAB — CBC WITH DIFFERENTIAL/PLATELET
Hemoglobin: 13.3 g/dL (ref 12.0–15.0)
Lymphocytes Relative: 33 % (ref 12–46)
Lymphs Abs: 2.1 10*3/uL (ref 0.7–4.0)
MCH: 29.2 pg (ref 26.0–34.0)
Monocytes Relative: 6 % (ref 3–12)
Neutro Abs: 3.5 10*3/uL (ref 1.7–7.7)
Neutrophils Relative %: 54 % (ref 43–77)
RBC: 4.55 MIL/uL (ref 3.87–5.11)
WBC: 6.5 10*3/uL (ref 4.0–10.5)

## 2013-05-16 LAB — URINALYSIS, ROUTINE W REFLEX MICROSCOPIC
Bilirubin Urine: NEGATIVE
Ketones, ur: NEGATIVE mg/dL
Nitrite: NEGATIVE
Urobilinogen, UA: 0.2 mg/dL (ref 0.0–1.0)

## 2013-05-16 LAB — LIPASE, BLOOD: Lipase: 39 U/L (ref 11–59)

## 2013-05-16 LAB — URINE MICROSCOPIC-ADD ON

## 2013-05-16 LAB — PREGNANCY, URINE: Preg Test, Ur: NEGATIVE

## 2013-05-16 MED ORDER — MORPHINE SULFATE 4 MG/ML IJ SOLN
4.0000 mg | Freq: Once | INTRAMUSCULAR | Status: AC
  Administered 2013-05-16: 4 mg via INTRAVENOUS
  Filled 2013-05-16: qty 1

## 2013-05-16 MED ORDER — TRAMADOL HCL 50 MG PO TABS: 50.0000 mg | ORAL_TABLET | Freq: Four times a day (QID) | ORAL | Status: DC | PRN

## 2013-05-16 MED ORDER — GI COCKTAIL ~~LOC~~
30.0000 mL | Freq: Once | ORAL | Status: AC
  Administered 2013-05-16: 30 mL via ORAL
  Filled 2013-05-16: qty 30

## 2013-05-16 MED ORDER — PROMETHAZINE HCL 25 MG PO TABS: 25.0000 mg | ORAL_TABLET | Freq: Four times a day (QID) | ORAL | Status: DC | PRN

## 2013-05-16 MED ORDER — PANTOPRAZOLE SODIUM 40 MG IV SOLR
40.0000 mg | INTRAVENOUS | Status: AC
  Administered 2013-05-16: 40 mg via INTRAVENOUS
  Filled 2013-05-16: qty 40

## 2013-05-16 MED ORDER — ONDANSETRON HCL 4 MG/2ML IJ SOLN
4.0000 mg | Freq: Once | INTRAMUSCULAR | Status: AC
  Administered 2013-05-16: 4 mg via INTRAVENOUS
  Filled 2013-05-16: qty 2

## 2013-05-16 MED ORDER — PANTOPRAZOLE SODIUM 20 MG PO TBEC: 20.0000 mg | DELAYED_RELEASE_TABLET | Freq: Every day | ORAL | Status: DC

## 2013-05-16 MED ORDER — HYDROMORPHONE HCL PF 1 MG/ML IJ SOLN
1.0000 mg | Freq: Once | INTRAMUSCULAR | Status: AC
  Administered 2013-05-16: 1 mg via INTRAVENOUS
  Filled 2013-05-16: qty 1

## 2013-05-16 MED ORDER — PROMETHAZINE HCL 25 MG/ML IJ SOLN
25.0000 mg | Freq: Once | INTRAMUSCULAR | Status: AC
  Administered 2013-05-16: 25 mg via INTRAVENOUS
  Filled 2013-05-16: qty 1

## 2013-05-16 NOTE — ED Provider Notes (Signed)
CSN: [id number]     Arrival date & time 05/16/13  4098 History     First MD Initiated Contact with Patient 05/16/13 0756     Chief Complaint  Patient presents with  . Abdominal Pain   (Consider location/radiation/quality/duration/timing/severity/associated sxs/prior Treatment) HPI Comments: Patient is a 37 year old female with a past medical history of GERD, hypertension, pseudotumor cerebri, and asthma who presents with a 1 week history of abdominal pain. The pain is located in her upper abdomen and does not radiate. The pain is described as cramping and severe. The pain started gradually and progressively worsened since the onset. No alleviating/aggravating factors. The patient has tried percocet for symptoms without relief. Associated symptoms include nausea and vomiting. Patient denies fever, headache, diarrhea, chest pain, SOB, dysuria, constipation, abnormal vaginal bleeding/discharge. Patient reports the stomach pain is worse with eating. She followed up with [facility] as instructed after her last visit and was told she had hydrosalpinx. Patient has not followed up since.      Past Medical History  Diagnosis Date  . Pseudotumor cerebri   . Asthma   . GERD (gastroesophageal reflux disease)   . Hypertension    Past Surgical History  Procedure Laterality Date  . Cholecystectomy    . Achilles tendon lengthening    . Abdominal hysterectomy    . Lumbar puncture  12/04/2011    dr. Gerilyn Pilgrim   Family History  Problem Relation Age of Onset  . Migraines Mother   . Cancer Maternal Uncle   . Cancer Maternal Uncle   . Cancer Paternal Grandmother   . Cancer Paternal Grandfather    History  Substance Use Topics  . Smoking status: Never Smoker   . Smokeless tobacco: Never Used  . Alcohol Use: No     Comment: occasionally   OB History   Grav Para Term Preterm Abortions TAB SAB Ect Mult Living   3 3 3       3      Review of Systems  Gastrointestinal: Positive for nausea,  vomiting and abdominal pain.  Neurological: Positive for weakness.  All other systems reviewed and are negative.    Allergies  Review of patient's allergies indicates no known allergies.  Home Medications   Current Outpatient Rx  Name  Route  Sig  Dispense  Refill  . albuterol (PROVENTIL HFA;VENTOLIN HFA) 108 (90 BASE) MCG/ACT inhaler   Inhalation   Inhale 2 puffs into the lungs every 4 (four) hours as needed for wheezing or shortness of breath. For shortness of breath         . ALPRAZolam (XANAX) 1 MG tablet   Oral   Take 1 mg by mouth 4 (four) times daily as needed for sleep or anxiety. For anxiety         . Aspirin-Caffeine (BC FAST PAIN RELIEF PO)   Oral   Take 2 Packages by mouth daily as needed. For headache         . budesonide-formoterol (SYMBICORT) 80-4.5 MCG/ACT inhaler   Inhalation   Inhale 2 puffs into the lungs 2 (two) times daily.          . ondansetron (ZOFRAN ODT) 8 MG disintegrating tablet      8mg  ODT q4 hours prn nausea   6 tablet   0   . ondansetron (ZOFRAN) 8 MG tablet   Oral   Take 8 mg by mouth every 4 (four) hours as needed for nausea.         Marland Kitchen  oxyCODONE-acetaminophen (PERCOCET/ROXICET) 5-325 MG per tablet   Oral   Take 2 tablets by mouth every 4 (four) hours as needed for pain.   30 tablet   0    BP 134/83  Pulse 79  Temp(Src) 98.2 F (36.8 C) (Oral)  Resp 20  Ht 5\' 10"  (1.778 m)  Wt 218 lb (98.884 kg)  BMI 31.28 kg/m2  SpO2 100%  LMP 11/11/2011 Physical Exam  Nursing note and vitals reviewed. Constitutional: She is oriented to person, place, and time. She appears well-developed and well-nourished. No distress.  HENT:  Head: Normocephalic and atraumatic.  Eyes: Conjunctivae and EOM are normal. Pupils are equal, round, and reactive to light. No scleral icterus.  Neck: Normal range of motion.  Cardiovascular: Normal rate and regular rhythm.  Exam reveals no gallop and no friction rub.   No murmur  heard. Pulmonary/Chest: Effort normal and breath sounds normal. She has no wheezes. She has no rales. She exhibits no tenderness.  Abdominal: Soft. She exhibits no distension. There is tenderness. There is no rebound and no guarding.  Generalized upper abdominal tenderness to palpation without focal tenderness. No peritoneal signs.   Musculoskeletal: Normal range of motion.  Neurological: She is alert and oriented to person, place, and time. Coordination normal.  Speech is goal-oriented. Moves limbs without ataxia.   Skin: Skin is warm and dry.  Psychiatric: She has a normal mood and affect. Her behavior is normal.    ED Course   Procedures (including critical care time)  Labs Reviewed  CBC WITH DIFFERENTIAL - Abnormal; Notable for the following:    Eosinophils Relative 7 (*)    All other components within normal limits  COMPREHENSIVE METABOLIC PANEL - Abnormal; Notable for the following:    Potassium 3.4 (*)    Total Bilirubin 0.2 (*)    All other components within normal limits  URINALYSIS, ROUTINE W REFLEX MICROSCOPIC - Abnormal; Notable for the following:    Hgb urine dipstick TRACE (*)    All other components within normal limits  URINE MICROSCOPIC-ADD ON - Abnormal; Notable for the following:    Squamous Epithelial / LPF FEW (*)    All other components within normal limits  URINE CULTURE  LIPASE, BLOOD  PREGNANCY, URINE   No results found.  1. Abdominal pain   2. Weakness     MDM  9:40 AM Labs unremarkable and unchanged from previous. Urinalysis unremarkable except for trace hemoglobin. Patient had a CT abdomen pelvis 1 week ago which shows no nephrolithiasis or acute changes. I will not repeat the CT scan. Vitals stable and patient afebrile. Patient will have morphine and zofran for symptoms and recommended GI follow up.   10:55 AM Patient will have more pain medication here. I will discharge the patient with protonix, tramadol and phenergan for symptoms. Patient  will follow up with GI doctor. Patient instructed to return with worsening or concerning symptoms.   Emilia Beck, PA-C 05/16/13 1140

## 2013-05-16 NOTE — ED Provider Notes (Signed)
Medical screening examination/treatment/procedure(s) were performed by non-physician practitioner and as supervising physician I was immediately available for consultation/collaboration.  Geoffery Lyons, MD 05/16/13 1210

## 2013-05-16 NOTE — ED Notes (Signed)
Pt reports she was here last week for the same thing but it has worsened. [facility] They gave her medicine, an IM injection that burned. sts she thinks she received 2 different antibiotics. Sts she doesn't know what she was diagnosed with. Reports she isn't getting better and was told if the pain doesn't go away then she needs to return to the hospital. Pt reports she has been nauseous and having generalized weakness. Reports pain is in upper abd across the top, feels like something is inside her eating her. C/o decreased appetite, sts when she does try to eat it just makes herself sicker. Reports stools have been softer than normal but not loose or watery. sts she vomited PTA today. Denies CP/SOB. Denies urinary issues/vaginal d/c.

## 2013-05-17 LAB — URINE CULTURE: Special Requests: NORMAL

## 2013-06-01 ENCOUNTER — Encounter: Payer: 59 | Admitting: Advanced Practice Midwife

## 2013-06-26 ENCOUNTER — Encounter (HOSPITAL_COMMUNITY): Payer: Self-pay | Admitting: *Deleted

## 2013-06-26 ENCOUNTER — Observation Stay (HOSPITAL_COMMUNITY)
Admission: EM | Admit: 2013-06-26 | Discharge: 2013-06-28 | Disposition: A | Discharge status: Home / Self Care | Payer: 59 | Attending: Family Medicine | Admitting: Family Medicine

## 2013-06-26 DIAGNOSIS — E876 Hypokalemia: Secondary | ICD-10-CM | POA: Insufficient documentation

## 2013-06-26 DIAGNOSIS — X58XXXA Exposure to other specified factors, initial encounter: Secondary | ICD-10-CM | POA: Insufficient documentation

## 2013-06-26 DIAGNOSIS — R238 Other skin changes: Secondary | ICD-10-CM

## 2013-06-26 DIAGNOSIS — I10 Essential (primary) hypertension: Secondary | ICD-10-CM | POA: Insufficient documentation

## 2013-06-26 DIAGNOSIS — T148XXA Other injury of unspecified body region, initial encounter: Secondary | ICD-10-CM | POA: Diagnosis present

## 2013-06-26 DIAGNOSIS — S40029A Contusion of unspecified upper arm, initial encounter: Secondary | ICD-10-CM | POA: Insufficient documentation

## 2013-06-26 DIAGNOSIS — M6282 Rhabdomyolysis: Principal | ICD-10-CM | POA: Insufficient documentation

## 2013-06-26 DIAGNOSIS — Z23 Encounter for immunization: Secondary | ICD-10-CM | POA: Insufficient documentation

## 2013-06-26 LAB — CBC WITH DIFFERENTIAL/PLATELET
Basophils Relative: 0 % (ref 0–1)
Eosinophils Absolute: 0.4 10*3/uL (ref 0.0–0.7)
HCT: 34.2 % — ABNORMAL LOW (ref 36.0–46.0)
Hemoglobin: 12.2 g/dL (ref 12.0–15.0)
Lymphs Abs: 2.3 10*3/uL (ref 0.7–4.0)
MCH: 30.2 pg (ref 26.0–34.0)
MCHC: 35.7 g/dL (ref 30.0–36.0)
Monocytes Absolute: 0.4 10*3/uL (ref 0.1–1.0)
Monocytes Relative: 6 % (ref 3–12)
RBC: 4.04 MIL/uL (ref 3.87–5.11)

## 2013-06-26 LAB — COMPREHENSIVE METABOLIC PANEL
AST: 76 U/L — ABNORMAL HIGH (ref 0–37)
Albumin: 3.8 g/dL (ref 3.5–5.2)
Alkaline Phosphatase: 52 U/L (ref 39–117)
BUN: 11 mg/dL (ref 6–23)
CO2: 30 mEq/L (ref 19–32)
Chloride: 104 mEq/L (ref 96–112)
Creatinine, Ser: 0.67 mg/dL (ref 0.50–1.10)
GFR calc non Af Amer: >90 mL/min (ref >90)
Potassium: 2.8 mEq/L — ABNORMAL LOW (ref 3.5–5.1)
Total Bilirubin: 0.2 mg/dL — ABNORMAL LOW (ref 0.3–1.2)

## 2013-06-26 LAB — PROTIME-INR: INR: 1.03 (ref 0.00–1.49)

## 2013-06-26 LAB — APTT: aPTT: 26 seconds (ref 24–37)

## 2013-06-26 NOTE — ED Notes (Signed)
The pt is covered in bruises all over her body.  She denies any injury.  She was working in [employer] and was lying in a boat.  She felt like somehting bit her but did not see anything.  She hurts allover

## 2013-06-27 DIAGNOSIS — R238 Other skin changes: Secondary | ICD-10-CM

## 2013-06-27 DIAGNOSIS — T148XXA Other injury of unspecified body region, initial encounter: Secondary | ICD-10-CM | POA: Diagnosis present

## 2013-06-27 DIAGNOSIS — E876 Hypokalemia: Secondary | ICD-10-CM | POA: Diagnosis present

## 2013-06-27 DIAGNOSIS — M6282 Rhabdomyolysis: Secondary | ICD-10-CM | POA: Diagnosis present

## 2013-06-27 LAB — URINALYSIS, ROUTINE W REFLEX MICROSCOPIC
Bilirubin Urine: NEGATIVE
Hgb urine dipstick: NEGATIVE
Ketones, ur: NEGATIVE mg/dL
Nitrite: NEGATIVE
Specific Gravity, Urine: 1.015 (ref 1.005–1.030)
Urobilinogen, UA: 0.2 mg/dL (ref 0.0–1.0)
pH: 7 (ref 5.0–8.0)

## 2013-06-27 LAB — ETHANOL: Alcohol, Ethyl (B): <11 mg/dL (ref 0–11)

## 2013-06-27 LAB — RAPID URINE DRUG SCREEN, HOSP PERFORMED
Amphetamines: NOT DETECTED
Barbiturates: NOT DETECTED
Benzodiazepines: NOT DETECTED
Cocaine: NOT DETECTED
Opiates: NOT DETECTED
Tetrahydrocannabinol: NOT DETECTED

## 2013-06-27 LAB — CK: Total CK: 3316 U/L — ABNORMAL HIGH (ref 7–177)

## 2013-06-27 LAB — MAGNESIUM: Magnesium: 2 mg/dL (ref 1.5–2.5)

## 2013-06-27 MED ORDER — SODIUM CHLORIDE 0.9 % IV SOLN
INTRAVENOUS | Status: DC
  Administered 2013-06-27 – 2013-06-28 (×3): via INTRAVENOUS

## 2013-06-27 MED ORDER — ONDANSETRON HCL 4 MG PO TABS
4.0000 mg | ORAL_TABLET | Freq: Four times a day (QID) | ORAL | Status: DC | PRN
  Filled 2013-06-27: qty 1

## 2013-06-27 MED ORDER — SODIUM CHLORIDE 0.9 % IV SOLN
1000.0000 mL | Freq: Once | INTRAVENOUS | Status: AC
  Administered 2013-06-27: 1000 mL via INTRAVENOUS

## 2013-06-27 MED ORDER — SODIUM CHLORIDE 0.9 % IV SOLN
1000.0000 mL | INTRAVENOUS | Status: DC
  Administered 2013-06-27: 1000 mL via INTRAVENOUS

## 2013-06-27 MED ORDER — OXYCODONE HCL 5 MG PO TABS
10.0000 mg | ORAL_TABLET | Freq: Once | ORAL | Status: AC
  Administered 2013-06-27: 10 mg via ORAL
  Filled 2013-06-27: qty 2

## 2013-06-27 MED ORDER — ONDANSETRON HCL 4 MG/2ML IJ SOLN: 4.0000 mg | Freq: Four times a day (QID) | INTRAMUSCULAR | Status: DC | PRN

## 2013-06-27 MED ORDER — POTASSIUM CHLORIDE CRYS ER 20 MEQ PO TBCR
40.0000 meq | EXTENDED_RELEASE_TABLET | Freq: Once | ORAL | Status: AC
  Administered 2013-06-27: 40 meq via ORAL
  Filled 2013-06-27: qty 2

## 2013-06-27 MED ORDER — MORPHINE SULFATE 2 MG/ML IJ SOLN
2.0000 mg | INTRAMUSCULAR | Status: DC | PRN
  Administered 2013-06-27 – 2013-06-28 (×5): 2 mg via INTRAVENOUS
  Filled 2013-06-27 (×6): qty 1

## 2013-06-27 MED ORDER — PNEUMOCOCCAL VAC POLYVALENT 25 MCG/0.5ML IJ INJ
0.5000 mL | INJECTION | INTRAMUSCULAR | Status: AC
  Administered 2013-06-28: 10:00:00 0.5 mL via INTRAMUSCULAR
  Filled 2013-06-27: qty 0.5

## 2013-06-27 MED ORDER — OXYCODONE-ACETAMINOPHEN 5-325 MG PO TABS: 2.0000 | ORAL_TABLET | ORAL | Status: DC | PRN

## 2013-06-27 MED ORDER — ALBUTEROL SULFATE HFA 108 (90 BASE) MCG/ACT IN AERS: 2.0000 | INHALATION_SPRAY | RESPIRATORY_TRACT | Status: DC | PRN

## 2013-06-27 MED ORDER — INFLUENZA VAC SPLIT QUAD 0.5 ML IM SUSP
0.5000 mL | INTRAMUSCULAR | Status: AC
  Administered 2013-06-28: 10:00:00 0.5 mL via INTRAMUSCULAR
  Filled 2013-06-27: qty 0.5

## 2013-06-27 MED ORDER — OXYCODONE-ACETAMINOPHEN 5-325 MG PO TABS
2.0000 | ORAL_TABLET | ORAL | Status: DC | PRN
  Administered 2013-06-27: 2 via ORAL
  Filled 2013-06-27: qty 2

## 2013-06-27 MED ORDER — BUDESONIDE-FORMOTEROL FUMARATE 80-4.5 MCG/ACT IN AERO
2.0000 | INHALATION_SPRAY | Freq: Two times a day (BID) | RESPIRATORY_TRACT | Status: DC
  Administered 2013-06-27 – 2013-06-28 (×3): 2 via RESPIRATORY_TRACT
  Filled 2013-06-27: qty 6.9

## 2013-06-27 MED ORDER — ALPRAZOLAM 0.5 MG PO TABS: 1.0000 mg | ORAL_TABLET | Freq: Four times a day (QID) | ORAL | Status: DC | PRN

## 2013-06-27 MED ORDER — WHITE PETROLATUM GEL
Status: AC
  Administered 2013-06-27: 0.2
  Filled 2013-06-27: qty 5

## 2013-06-27 MED ORDER — HYDROMORPHONE HCL PF 1 MG/ML IJ SOLN
1.0000 mg | INTRAMUSCULAR | Status: DC | PRN
  Administered 2013-06-27: 1 mg via INTRAVENOUS
  Filled 2013-06-27: qty 1

## 2013-06-27 NOTE — ED Provider Notes (Signed)
CSN: [id number]     Arrival date & time 06/26/13  2034 History   First MD Initiated Contact with Patient 06/26/13 2353     Chief Complaint  Patient presents with  . multiple bruises    (Consider location/radiation/quality/duration/timing/severity/associated sxs/prior Treatment) HPI 37 yo female presents to the ER from home with complaint of bruises without known injury,body aches, fatigue.  Pt reports symptoms started last night after work.  Pt recently started new job with [employer], a [employer].  She reports she was sitting in a boat last night, and felt like something like an insect stung or bit her on the right arm.  No swelling, no seen bite.  After going home from work, she felt achy and "awful".  Throughout the day she has had red areas show up on her body and some are turning into bruises.  She adamantly denies any trauma.  Bruising to left inner upper arm, left breast, and right inner am.  She has round reddish lesions to lower legs, c/o swelling to bilateral lower legs with worsening to right lower leg with pain.  No prior h/o same. No new medications, no bleeding or bruising problems in the past, no recent illnesses.  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  Other pmh includes pseuedotumor cerebri, htn, asthma.  She has been having problems with ongoing abdominal pain.  Denies illicit drugs, only drinks alcohol occasionally.  Past Medical History  Diagnosis Date  . Pseudotumor cerebri   . Asthma   . GERD (gastroesophageal reflux disease)   . Hypertension    Past Surgical History  Procedure Laterality Date  . Cholecystectomy    . Achilles tendon lengthening    . Abdominal hysterectomy    . Lumbar puncture  12/04/2011    dr. Gerilyn Pilgrim   Family History  Problem Relation Age of Onset  . Migraines Mother   . Cancer Maternal Uncle   . Cancer Maternal Uncle   . Cancer Paternal Grandmother   . Cancer Paternal Grandfather     History  Substance Use Topics  . Smoking status: Never Smoker   . Smokeless tobacco: Never Used  . Alcohol Use: No     Comment: occasionally   OB History   Grav Para Term Preterm Abortions TAB SAB Ect Mult Living   3 3 3       3      Review of Systems  All other systems reviewed and are negative.  other than listed in hpi  Allergies  Review of patient's allergies indicates no known allergies.  Home Medications   Current Outpatient Rx  Name  Route  Sig  Dispense  Refill  . albuterol (PROVENTIL HFA;VENTOLIN HFA) 108 (90 BASE) MCG/ACT inhaler   Inhalation   Inhale 2 puffs into the lungs every 4 (four) hours as needed for wheezing or shortness of breath. For shortness of breath         . ALPRAZolam (XANAX) 1 MG tablet   Oral   Take 1 mg by mouth 4 (four) times daily as needed for sleep or anxiety. For anxiety         . Aspirin-Caffeine (BC FAST PAIN RELIEF PO)   Oral   Take 2 Packages by mouth daily as needed. For headache         . budesonide-formoterol (SYMBICORT) 80-4.5 MCG/ACT inhaler   Inhalation   Inhale 2 puffs into the lungs 2 (two) times daily.          Marland Kitchen  ondansetron (ZOFRAN ODT) 8 MG disintegrating tablet      8mg  ODT q4 hours prn nausea   6 tablet   0   . oxyCODONE-acetaminophen (PERCOCET/ROXICET) 5-325 MG per tablet   Oral   Take 2 tablets by mouth every 4 (four) hours as needed for pain.   30 tablet   0   . promethazine (PHENERGAN) 25 MG tablet   Oral   Take 1 tablet (25 mg total) by mouth every 6 (six) hours as needed for nausea.   12 tablet   0    BP 149/99  Pulse 68  Temp(Src) 98.6 F (37 C) (Oral)  Resp 18  SpO2 99%  LMP 11/11/2011 Physical Exam  Nursing note and vitals reviewed. Constitutional: She is oriented to person, place, and time. She appears well-developed and well-nourished. She appears distressed.  HENT:  Head: Normocephalic and atraumatic.  Nose: Nose normal.  Mouth/Throat: Oropharynx is clear and moist.   Eyes: Conjunctivae and EOM are normal. Pupils are equal, round, and reactive to light.  Neck: Normal range of motion. Neck supple. No JVD present. No tracheal deviation present. No thyromegaly present.  Cardiovascular: Normal rate, regular rhythm, normal heart sounds and intact distal pulses.  Exam reveals no gallop and no friction rub.   No murmur heard. Pulmonary/Chest: Effort normal and breath sounds normal. No stridor. No respiratory distress. She has no wheezes. She has no rales. She exhibits no tenderness.  Abdominal: Soft. Bowel sounds are normal. She exhibits no distension and no mass. There is no tenderness. There is no rebound and no guarding.  Musculoskeletal: She exhibits tenderness.  Pt c/o swelling to right leg, no difference in circumference, diffuse tenderness with palpation throughout body  Lymphadenopathy:    She has no cervical adenopathy.  Neurological: She is alert and oriented to person, place, and time. She exhibits normal muscle tone. Coordination normal.  Skin: Skin is warm and dry.  Extensive ecchymosis to left upper inner arm, breast.  Scattered ecchymosis to right inner arm.  Diffuse round 3-4 cm erythematous and painful nodules to lower extremities mainly over knees down to ankles    ED Course  Procedures (including critical care time) Labs Review Labs Reviewed  CBC WITH DIFFERENTIAL - Abnormal; Notable for the following:    HCT 34.2 (*)    Eosinophils Relative 7 (*)    All other components within normal limits  COMPREHENSIVE METABOLIC PANEL - Abnormal; Notable for the following:    Potassium 2.8 (*)    Glucose, Bld 102 (*)    AST 76 (*)    Total Bilirubin 0.2 (*)    All other components within normal limits  CK - Abnormal; Notable for the following:    Total CK 3316 (*)    All other components within normal limits  PROTIME-INR  APTT  SEDIMENTATION RATE  URINALYSIS, ROUTINE W REFLEX MICROSCOPIC   Imaging Review No results found.  MDM   1.  Rhabdomyolysis   2. Hypokalemia   3. Abnormal bruising     37 yo female with unexplained bruising, nodules.  Hypokalemia noted.  No thrombocytopenia or clotting problems noted.  Possible erythema nodosum, but the bruising is quite impressive.  Will check sed rate, total ck.  Will replete K, give pain medication.  Will need further w/u outpatient.  1:54 AM Total CK has come back significantly elevated, indicating rhabdomyolysis.  No kidney injury noted at this time.  Discussed again with patient her sting or bite yesterday.  Pt was  in a boat, up off the ground.  After bite, looked around with flashlight but did not see anything.  Doubt snakebite given her description of the scene-but possibly scorpion?  Black widow spider?  Will d/w hospitalist for admission.  Olivia Mackie, MD 06/27/13 0201

## 2013-06-27 NOTE — ED Notes (Signed)
The pt  Reports that her pain is no better and her rt leg is hurting more.  She has become more anxious.  Vital remain unchanged

## 2013-06-27 NOTE — ED Notes (Signed)
Both calves measure the same14 and 1/4 inches 42 and 1/2 metric

## 2013-06-27 NOTE — H&P (Signed)
[facility] History and Physical  [name] VWU:[id number] DOB: 1975-12-20 DOA: 06/26/2013  Referring physician: Dr. Alferd Patee PCP: Alice Reichert, MD  Specialists: none  Chief Complaint: multiple brusing  HPI: [name] is a 37 y.o. 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 Pt reports that symptoms started last night after working at [employer]. She states she was on a boat and felt that something bit her on right arm,  but could not what it was due to darkness. After work patient started to complain of chills, aches, dizziness, chest pain, nausea and pain to movement. The patient noted she had red spots on body that later turned into bruising. Bruising is prominent on left inner upper arm and left breast. She also states she had swelling to bilateral lower extremities and painful to movement. Pt denies new medications, trauma, bleeding or bruising problems.  Pt states she was feeling normal prior to last night.  In the ED patient was found to have elevated CK levels and as such we were consulted for management of rhabdomyolysis  Review of Systems: Pt denies chest pain, SOB, syncope,  fever, nausea, vomiting, diarrhea, constipation, blood in stool or urine, no dysphagia, odynophagia, rhynorhea, + new rash, no headaches, no hematuria   Past Medical History  Diagnosis Date  . Pseudotumor cerebri   . Asthma   . GERD (gastroesophageal reflux disease)   . Hypertension    Past Surgical History  Procedure Laterality Date  . Cholecystectomy    . Achilles tendon lengthening    . Abdominal hysterectomy    . Lumbar puncture  12/04/2011    dr. Gerilyn Pilgrim   Social History:  reports that she has never smoked. She has never used smokeless tobacco. She reports that she uses illicit drugs (Marijuana). She reports that she  does not drink alcohol. Lives at home with husband  Can patient participate in ADLs? Yes  No Known Allergies  Family History  Problem Relation Age of Onset  . Migraines Mother   . Cancer Maternal Uncle   . Cancer Maternal Uncle   . Cancer Paternal Grandmother   . Cancer Paternal Grandfather   non contributory  Prior to Admission medications   Medication Sig Start Date End Date Taking? Authorizing Provider  albuterol (PROVENTIL HFA;VENTOLIN HFA) 108 (90 BASE) MCG/ACT inhaler Inhale 2 puffs into the lungs every 4 (four) hours as needed for wheezing or shortness of breath. For shortness of breath   Yes Historical Provider, MD  ALPRAZolam Prudy Feeler) 1 MG tablet Take 1 mg by mouth 4 (four) times daily as needed for sleep or anxiety. For anxiety   Yes Historical Provider, MD  Aspirin-Caffeine (BC FAST PAIN RELIEF PO) Take 2 Packages by mouth daily as needed. For headache   Yes Historical Provider, MD  budesonide-formoterol (SYMBICORT) 80-4.5 MCG/ACT inhaler Inhale 2 puffs into the lungs 2 (two) times daily.    Yes Historical Provider, MD  ondansetron (ZOFRAN ODT) 8 MG disintegrating tablet 8mg  ODT q4 hours prn nausea 05/11/13  Yes Hannah Muthersbaugh, PA-C  oxyCODONE-acetaminophen (PERCOCET/ROXICET) 5-325 MG per tablet Take 2 tablets by mouth every 4 (four) hours as needed for pain. 05/12/13  Yes Jean Rosenthal, NP  promethazine (PHENERGAN) 25 MG tablet Take 1 tablet (25 mg total) by mouth every 6 (six) hours as needed for nausea.  05/16/13  Yes Emilia Beck, PA-C   Physical Exam: Filed Vitals:   06/27/13 0400  BP: 163/120  Pulse: 70  Temp: 98.5 F (36.9 C)  Resp: 20     General:  Pt lying in bed in NAD  Eyes: EOMI, non icteric.  Pupils 2mm PERRLA  ENT: External nose normal. MM moist, tongue midline.    Neck: supple . No goiter noted  Cardiovascular: s1 and s2 normal with no MRG. Radial and pedal pulses 2+ with no cyanosis present  Respiratory: regular unlabored breathing.Lungs  CTA with no wheeze, rhochi or crackles   Abdomen: symmetric, ND, tender to touch. Bowel sounds x 4  Skin: ecchymosis to left arm at upper medial measuring 5x3 cm and top of left breast bruising 5x4 cm approximately.   Musculoskeletal: tenderness present to lower extremities. No swelling present  Psychiatric: mood and affect appropriate  Neurologic: alert and oriented, answers questions appropriately, moves all extremities equally  Labs on Admission:  Basic Metabolic Panel:  Recent Labs Lab 06/26/13 2046  NA 141  K 2.8*  CL 104  CO2 30  GLUCOSE 102*  BUN 11  CREATININE 0.67  CALCIUM 8.4   Liver Function Tests:  Recent Labs Lab 06/26/13 2046  AST 76*  ALT 23  ALKPHOS 52  BILITOT 0.2*  PROT 6.9  ALBUMIN 3.8   No results found for this basename: LIPASE, AMYLASE,  in the last 168 hours No results found for this basename: AMMONIA,  in the last 168 hours CBC:  Recent Labs Lab 06/26/13 2046  WBC 6.4  NEUTROABS 3.3  HGB 12.2  HCT 34.2*  MCV 84.7  PLT 348   Cardiac Enzymes:  Recent Labs Lab 06/27/13 0029  CKTOTAL 3316*    BNP (last 3 results) No results found for this basename: PROBNP,  in the last 8760 hours CBG: No results found for this basename: GLUCAP,  in the last 168 hours  Radiological Exams on Admission: No results found.   Assessment/Plan Active Problems:   Rhabdomyolysis   Hypokalemia   Bruising   1. Rhabdomyolysis - elevated total CK of 3316 - IVF with normal saline 150 cc/hr -Recheck in AM  -Etiology uncertain differential includes venom from bugs, drug induced as such obtaining UDS, toxin from unknown organism (snake?) although on exam there are no puncture sites or welts.  2. Hypokalemia - ED replete K+ with 40 meq tablet x once -Will recheck BMP  - etiology uncertain maybe to decrease K intake - check mg levels  3. Bruising -CBC, Liver enzymes show elevated AST at 76 but normal ALT , Pt and INR for next am. -continue to  monitor if bruising spreads, will treat supportively - once again etiology uncertain. Unusual for bug bite to affect the limb that the bite was not on. - Patient denies any abuse at home.   Code Status: Full  Family Communication: with patient at bedside   Disposition Plan: Pending resolution of rhabdomyolysis   Time spent:> 60 minutes  Penny Pia [facility] Pager [phone]  If 7PM-7AM, please contact night-coverage [email or web address] Password [facility] 06/27/2013, 8:19 AM

## 2013-06-27 NOTE — ED Notes (Signed)
The pt is resting better on the phone.  Iv nss infusing.  Stopping when she bends her arm

## 2013-06-28 LAB — CBC
HCT: 33 % — ABNORMAL LOW (ref 36.0–46.0)
MCH: 29.6 pg (ref 26.0–34.0)
MCV: 85.1 fL (ref 78.0–100.0)
Platelets: 308 10*3/uL (ref 150–400)
RBC: 3.88 MIL/uL (ref 3.87–5.11)
RDW: 13.5 % (ref 11.5–15.5)

## 2013-06-28 LAB — BASIC METABOLIC PANEL
CO2: 25 mEq/L (ref 19–32)
Calcium: 7.8 mg/dL — ABNORMAL LOW (ref 8.4–10.5)
Chloride: 107 mEq/L (ref 96–112)
Creatinine, Ser: 0.59 mg/dL (ref 0.50–1.10)
GFR calc non Af Amer: >90 mL/min (ref >90)
Glucose, Bld: 104 mg/dL — ABNORMAL HIGH (ref 70–99)
Potassium: 3.5 mEq/L (ref 3.5–5.1)
Sodium: 139 mEq/L (ref 135–145)

## 2013-06-28 MED ORDER — NITROGLYCERIN 0.4 MG SL SUBL
SUBLINGUAL_TABLET | SUBLINGUAL | Status: AC
  Administered 2013-06-28: 03:00:00 0.4 mg
  Filled 2013-06-28: qty 75

## 2013-06-28 MED ORDER — MELOXICAM 7.5 MG PO TABS: 15.0000 mg | ORAL_TABLET | Freq: Every day | ORAL | Status: DC | PRN

## 2013-06-28 MED ORDER — KETOROLAC TROMETHAMINE 30 MG/ML IJ SOLN
30.0000 mg | Freq: Three times a day (TID) | INTRAMUSCULAR | Status: DC | PRN
  Administered 2013-06-28: 30 mg via INTRAVENOUS
  Filled 2013-06-28: qty 1

## 2013-06-28 MED ORDER — ASPIRIN 81 MG PO CHEW
CHEWABLE_TABLET | ORAL | Status: AC
  Filled 2013-06-28: qty 1

## 2013-06-28 MED ORDER — OXYCODONE-ACETAMINOPHEN 5-325 MG PO TABS: 2.0000 | ORAL_TABLET | ORAL | Status: DC | PRN

## 2013-06-28 MED ORDER — ASPIRIN 325 MG PO TABS
325.0000 mg | ORAL_TABLET | Freq: Every day | ORAL | Status: DC
  Administered 2013-06-28: 325 mg via ORAL
  Filled 2013-06-28: qty 1

## 2013-06-28 MED ORDER — NITROGLYCERIN 0.4 MG SL SUBL
0.4000 mg | SUBLINGUAL_TABLET | SUBLINGUAL | Status: DC | PRN
  Administered 2013-06-28 (×2): 0.4 mg via SUBLINGUAL

## 2013-06-28 MED ORDER — OXYCODONE HCL 5 MG PO TABS: 5.0000 mg | ORAL_TABLET | ORAL | Status: DC | PRN

## 2013-06-28 NOTE — Progress Notes (Signed)
Pt denies pain at this time. Pt in room laying in bed with eyes closed. No distress noted at this time. Will continue to monitor. Gilman Schmidt

## 2013-06-28 NOTE — Progress Notes (Signed)
Pt awaked from sleep with complaint of 10/10 crushing mid chest pain per pt. Pt states she is having SOB. Vital signs are:   Filed Vitals:   06/28/13 0159  BP: 143/94  Pulse: 65  Temp: 97.7 F (36.5 C)  Resp: 18    Kirby NP notified. [facility] notified. Gunnar Fusi RN from R[facility] currently in room with pt. K. Craige Cotta will come up to assess pt shortly. EKG completed. Gilman Schmidt

## 2013-06-28 NOTE — Progress Notes (Signed)
Discharge home.  Patient educated on discharge instructions and instructed to follow up with her primary care provider.  AVF signed and copy given to patient.  IV discontinued.  Telemetry discontinued and telemetry monitor returned.  Patient belongings gathered.  Flu and pneumonia vaccinations given.  Patient verbalized understanding of discharge instructions and prescriptions.  Scripts given to patient.

## 2013-06-28 NOTE — Discharge Summary (Signed)
Physician Discharge Summary  [name] ZOX:[id number] DOB: [birth year] DOA: 06/26/2013  PCP: Alice Reichert, MD  Admit date: 06/26/2013 Discharge date: 06/28/2013  Time spent: > 35 minutes  Recommendations for Outpatient Follow-up:  1. Please be sure to f/u on CK levels 2. Also patient had elevated AST please f/u on this  Discharge Diagnoses:  Active Problems:   Rhabdomyolysis   Hypokalemia   Bruising   Discharge Condition: Stable  Diet recommendation: General diet  Filed Weights   06/27/13 0650  Weight: 110.36 kg (243 lb 4.8 oz)    History of present illness:  37 [name] that presented with diffuse muscle aches and bruising after a presumed insect bite.  Hospital Course:   1. Rhabdomyolysis - elevated total CK of 3316 initially which trended down to 1200 - Recommend increase in fluid intake when she goes home. -Etiology uncertain differential includes venom from bug bite although on inspection there is no obvious puncture site.   - UDS negative - Treat supportively and discharge on pain medication. - Patient had report of pain this am but she has had pain all over more specifically.  Her troponin was negative. .  2. Hypokalemia  - Resolved after replacement  3. Bruising  -CBC, Liver enzymes show elevated AST at 76 but normal ALT , INR at 1.06 - will treat supportively  - once again etiology uncertain. Unusual for bug bite to affect the limb that the bite was not on.  - Patient denies any abuse at home.   Procedures:  None  Consultations:  None  Discharge Exam: Filed Vitals:   06/28/13 0845  BP: 137/80  Pulse: 90  Temp: 97.9 F (36.6 C)  Resp: 18    General: Pt in NAD, alert and Awake Cardiovascular: RRR, no MRG Respiratory: CTA BL, no wheezes  Discharge Instructions  Discharge Orders   Future Orders Complete By Expires   Call MD for:  difficulty breathing, headache or visual disturbances  As directed    Call MD for:  persistant dizziness or  light-headedness  As directed    Call MD for:  persistant nausea and vomiting  As directed    Diet - low sodium heart healthy  As directed    Discharge instructions  As directed    Comments:     Please be sure to follow up with your primary care physician in 1-2 weeks or sooner should any new concerns arise.   Increase activity slowly  As directed        Medication List         albuterol 108 (90 BASE) MCG/ACT inhaler  Commonly known as:  PROVENTIL HFA;VENTOLIN HFA  Inhale 2 puffs into the lungs every 4 (four) hours as needed for wheezing or shortness of breath. For shortness of breath     ALPRAZolam 1 MG tablet  Commonly known as:  XANAX  Take 1 mg by mouth 4 (four) times daily as needed for sleep or anxiety. For anxiety     BC FAST PAIN RELIEF PO  Take 2 Packages by mouth daily as needed. For headache     budesonide-formoterol 80-4.5 MCG/ACT inhaler  Commonly known as:  SYMBICORT  Inhale 2 puffs into the lungs 2 (two) times daily.     meloxicam 7.5 MG tablet  Commonly known as:  MOBIC  Take 2 tablets (15 mg total) by mouth daily as needed for pain.     ondansetron 8 MG disintegrating tablet  Commonly known as:  ZOFRAN ODT  8mg  ODT q4 hours prn nausea     oxyCODONE 5 MG immediate release tablet  Commonly known as:  Oxy IR/ROXICODONE  Take 1 tablet (5 mg total) by mouth every 4 (four) hours as needed for pain (breakthrough pain medication).     oxyCODONE-acetaminophen 5-325 MG per tablet  Commonly known as:  PERCOCET/ROXICET  Take 2 tablets by mouth every 4 (four) hours as needed.     promethazine 25 MG tablet  Commonly known as:  PHENERGAN  Take 1 tablet (25 mg total) by mouth every 6 (six) hours as needed for nausea.       No Known Allergies    The results of significant diagnostics from this hospitalization (including imaging, microbiology, ancillary and laboratory) are listed below for reference.    Significant Diagnostic Studies: No results  found.  Microbiology: No results found for this or any previous visit (from the past 240 hour(s)).   Labs: Basic Metabolic Panel:  Recent Labs Lab 06/26/13 2046 06/27/13 1240 06/28/13 0300  NA 141  --  139  K 2.8*  --  3.5  CL 104  --  107  CO2 30  --  25  GLUCOSE 102*  --  104*  BUN 11  --  8  CREATININE 0.67  --  0.59  CALCIUM 8.4  --  7.8*  MG  --  2.0  --   PHOS  --  2.9  --    Liver Function Tests:  Recent Labs Lab 06/26/13 2046  AST 76*  ALT 23  ALKPHOS 52  BILITOT 0.2*  PROT 6.9  ALBUMIN 3.8   No results found for this basename: LIPASE, AMYLASE,  in the last 168 hours No results found for this basename: AMMONIA,  in the last 168 hours CBC:  Recent Labs Lab 06/26/13 2046 06/28/13 0300  WBC 6.4 5.5  NEUTROABS 3.3  --   HGB 12.2 11.5*  HCT 34.2* 33.0*  MCV 84.7 85.1  PLT 348 308   Cardiac Enzymes:  Recent Labs Lab 06/27/13 0029 06/28/13 0229 06/28/13 0300  CKTOTAL 3316*  --  1200*  TROPONINI  --  <0.30  --    BNP: BNP (last 3 results) No results found for this basename: PROBNP,  in the last 8760 hours CBG: No results found for this basename: GLUCAP,  in the last 168 hours     Signed:  Penny Pia  [facility] 06/28/2013, 10:26 AM

## 2013-06-28 NOTE — Progress Notes (Addendum)
Shift event: RN paged this NP secondary to pt having 10/10 chest pain. VSS. Rapid response nurse at bedside. NP spoke to her Gunnar Fusi). NTG and MSO4 ordered as well as O2 at 2L and ASA 325mg . NP to bedside.  S: Pt states she was sleeping and awoke with chest pain. Describes chest pain as pressure-like something is pressing on her chest. Pain is left sternal radiating under her left breast. Pain 10/10-no better after first NTG. No SOB, back pain, jaw pain, shoulder pain or abd pain. Says she has been achy all over since admission. Has no cardiac hx.  O: Obese 37 yo AAF who appears well although grimacing in pain. VSS. O2 sat normal. Alert and oriented. Appropriate in affect and speech. CARD: RRR. RESP: normal effort. Lungs CTA.  ABD: soft, NT. MSK: chest pain is reproducible with palpation of anterior chest wall. NEURO: grossly intact. A/P: 1. Chest pain, acute-doubt ACS given reproducible pain. No pain relief with Morphine or NTG x 3. VSS. ASA given and O2 applied. EKG-do not see any acute changes compared to previous EKG in chart. Cycle trops, recheck CK and increase IVF if needed, get am blood work now. If creat OK, can try Toradol. Kpad.  Will follow. Jimmye Norman, NP [facility] Update: labs show lower CK, normal troponin, normal BMP except calcium, INR 1.06.

## 2013-06-28 NOTE — Significant Event (Signed)
Rapid Response Event Note  Called by Liborio Nixon RN to assess pt with c/o midsternal CP with radiation to left chest, under breast onset 0205 which wakened pt from sleep.  Pt rates Pain 10/10 describesd as pressure.  Denies radiation of pain to jaw or arm, denies SOB, diaphoresis, nausea or vomiting.    VS:  143/94 97.9  65 18 97% RA.   Placed on 2l Cherry and 12 lead EKG obtained  Reading NSR rate 65 with Septal infarct age undetermined.  Spoke to [employer].  NTG .4 mg SL given times three per protocol with no decrease in LEft chest pain.Maren Reamer to bedside at  0240. Pt had also  Received MSO4 2 mg IV at 0210 with no relief of chest pain.     Placed on cardiac monitor per order. 0865  PT continues with 10/10 chest pain.  Suspect rhabdo  Involvement in chest  Pain.  Hand off report given to Liborio Nixon, RN  Overview:      Initial Focused Assessment:   Interventions:   Event Summary: Name of Physician Notified: Maren Reamer  at 0210    at    Outcome: Stayed in room and stabalized     Ita Fritzsche, [place]

## 2013-07-04 ENCOUNTER — Emergency Department (HOSPITAL_COMMUNITY)
Admission: EM | Admit: 2013-07-04 | Discharge: 2013-07-04 | Disposition: A | Discharge status: Home / Self Care | Payer: 59 | Attending: Emergency Medicine | Admitting: Emergency Medicine

## 2013-07-04 ENCOUNTER — Encounter (HOSPITAL_COMMUNITY): Payer: Self-pay | Admitting: *Deleted

## 2013-07-04 DIAGNOSIS — I10 Essential (primary) hypertension: Secondary | ICD-10-CM | POA: Insufficient documentation

## 2013-07-04 DIAGNOSIS — Z79899 Other long term (current) drug therapy: Secondary | ICD-10-CM | POA: Insufficient documentation

## 2013-07-04 DIAGNOSIS — J45909 Unspecified asthma, uncomplicated: Secondary | ICD-10-CM | POA: Insufficient documentation

## 2013-07-04 DIAGNOSIS — Z8719 Personal history of other diseases of the digestive system: Secondary | ICD-10-CM | POA: Insufficient documentation

## 2013-07-04 DIAGNOSIS — N6320 Unspecified lump in the left breast, unspecified quadrant: Secondary | ICD-10-CM

## 2013-07-04 DIAGNOSIS — Z8669 Personal history of other diseases of the nervous system and sense organs: Secondary | ICD-10-CM | POA: Insufficient documentation

## 2013-07-04 DIAGNOSIS — N63 Unspecified lump in unspecified breast: Secondary | ICD-10-CM | POA: Insufficient documentation

## 2013-07-04 NOTE — ED Notes (Signed)
Called Radiology to schedule Mammogram for pt.  Scheduled for Wednesday at 2:30

## 2013-07-04 NOTE — ED Provider Notes (Signed)
CSN: [id number]     Arrival date & time 07/04/13  1230 History   First MD Initiated Contact with Patient 07/04/13 1324     Chief Complaint  Patient presents with  . Breast Problem   (Consider location/radiation/quality/duration/timing/severity/associated sxs/prior Treatment) The history is provided by the patient.  [name] is a 37 y.o. female who presents to the ED with left breast pain that started several days ago. She has bruising to the breast but no known injury. She has felt knots in the breast and left axilla. She was evaluated by her PCP last week but for another problem. She did mention the bruising to him and he told her she would need to see a hematologist. She did not have the knots in the breast and axilla at that time.    Past Medical History  Diagnosis Date  . Pseudotumor cerebri   . Asthma   . GERD (gastroesophageal reflux disease)   . Hypertension    Past Surgical History  Procedure Laterality Date  . Cholecystectomy    . Achilles tendon lengthening    . Abdominal hysterectomy    . Lumbar puncture  12/04/2011    dr. Gerilyn Pilgrim   Family History  Problem Relation Age of Onset  . Migraines Mother   . Cancer Maternal Uncle   . Cancer Maternal Uncle   . Cancer Paternal Grandmother   . Cancer Paternal Grandfather    History  Substance Use Topics  . Smoking status: Never Smoker   . Smokeless tobacco: Never Used  . Alcohol Use: No     Comment: occasionally   OB History   Grav Para Term Preterm Abortions TAB SAB Ect Mult Living   3 3 3       3      Review of Systems  Constitutional: Negative for fever and chills.  HENT: Negative for neck pain.   Gastrointestinal: Negative for nausea and vomiting.  Genitourinary:       Breast pain and mass left  Skin: Negative for rash.  Neurological: Negative for headaches.  Psychiatric/Behavioral: The patient is not nervous/anxious.     Allergies  Review of patient's allergies indicates no known allergies.  Home  Medications   Current Outpatient Rx  Name  Route  Sig  Dispense  Refill  . albuterol (PROVENTIL HFA;VENTOLIN HFA) 108 (90 BASE) MCG/ACT inhaler   Inhalation   Inhale 2 puffs into the lungs every 4 (four) hours as needed for wheezing or shortness of breath. For shortness of breath         . ALPRAZolam (XANAX) 1 MG tablet   Oral   Take 1 mg by mouth 4 (four) times daily as needed for sleep or anxiety. For anxiety         . Aspirin-Caffeine (BC FAST PAIN RELIEF PO)   Oral   Take 2 Packages by mouth daily as needed. For headache         . budesonide-formoterol (SYMBICORT) 80-4.5 MCG/ACT inhaler   Inhalation   Inhale 2 puffs into the lungs 2 (two) times daily.          . meloxicam (MOBIC) 7.5 MG tablet   Oral   Take 2 tablets (15 mg total) by mouth daily as needed for pain.   30 tablet   0   . oxyCODONE (OXY IR/ROXICODONE) 5 MG immediate release tablet   Oral   Take 1 tablet (5 mg total) by mouth every 4 (four) hours as needed for pain (breakthrough  pain medication).   30 tablet   0   . oxyCODONE-acetaminophen (PERCOCET/ROXICET) 5-325 MG per tablet   Oral   Take 2 tablets by mouth every 4 (four) hours as needed.   30 tablet   0    BP 140/104  Pulse 74  Temp(Src) 98.5 F (36.9 C) (Oral)  Resp 18  Ht 5\' 10"  (1.778 m)  Wt 228 lb (103.42 kg)  BMI 32.71 kg/m2  SpO2 98%  LMP 11/11/2011 Physical Exam  Nursing note and vitals reviewed. Constitutional: She is oriented to person, place, and time. No distress.  Obese, elevated BP  Eyes: EOM are normal.  Neck: Neck supple.  Cardiovascular: Normal rate.   Pulmonary/Chest: Effort normal. Left breast exhibits mass, skin change and tenderness. Left breast exhibits no inverted nipple and no nipple discharge.    Ecchymosis noted left breast. There is tenderness on exam. There are cystic like area palpated at the axilla and the breast.   Abdominal: Soft. There is no tenderness.  Musculoskeletal: Normal range of motion.    Neurological: She is alert and oriented to person, place, and time. No cranial nerve deficit.  Skin: Skin is warm and dry.  Psychiatric: She has a normal mood and affect. Her behavior is normal.    ED Course  Procedures  MDM  37 y.o. female with left breast pain, ecchymosis and mass. Mass of left axilla. Scheduled patient for Diagnostic mammogram for this week 07/07/2013 here in the Radiology department.  Discussed with the patient and all questioned fully answered. She will return for the mammogram.  Patient stable for discharge home without any immediate complications. Discussed elevated BP and need for follow up.     Janne Napoleon, [place] 07/05/13 [phone]

## 2013-07-04 NOTE — ED Notes (Addendum)
Pt says she has "lumps" in her rt breast for 1.5 weeks   Recent adm at [facility] for rhabdomyoysis.  After ? Insect bite to rt arm.  Concerned about bruising to lt breast .

## 2013-07-06 ENCOUNTER — Encounter (HOSPITAL_COMMUNITY): Payer: 59

## 2013-07-06 ENCOUNTER — Other Ambulatory Visit (HOSPITAL_COMMUNITY): Payer: Self-pay | Admitting: Emergency Medicine

## 2013-07-06 ENCOUNTER — Ambulatory Visit (HOSPITAL_COMMUNITY)
Admission: RE | Admit: 2013-07-06 | Discharge: 2013-07-06 | Disposition: A | Discharge status: Home / Self Care | Payer: 59 | Source: Ambulatory Visit | Attending: Emergency Medicine | Admitting: Emergency Medicine

## 2013-07-06 DIAGNOSIS — IMO0002 Reserved for concepts with insufficient information to code with codable children: Secondary | ICD-10-CM

## 2013-07-06 DIAGNOSIS — N63 Unspecified lump in unspecified breast: Secondary | ICD-10-CM | POA: Insufficient documentation

## 2013-07-14 NOTE — ED Provider Notes (Signed)
Medical screening examination/treatment/procedure(s) were performed by non-physician practitioner and as supervising physician I was immediately available for consultation/collaboration.   Trason Shifflet W. Nahom Carfagno, MD 07/14/13 1123 

## 2013-07-17 ENCOUNTER — Emergency Department (HOSPITAL_COMMUNITY): Payer: 59

## 2013-07-17 ENCOUNTER — Encounter (HOSPITAL_COMMUNITY): Payer: Self-pay | Admitting: Emergency Medicine

## 2013-07-17 ENCOUNTER — Emergency Department (HOSPITAL_COMMUNITY)
Admission: EM | Admit: 2013-07-17 | Discharge: 2013-07-17 | Disposition: A | Discharge status: Home / Self Care | Payer: 59 | Attending: Emergency Medicine | Admitting: Emergency Medicine

## 2013-07-17 DIAGNOSIS — Z8719 Personal history of other diseases of the digestive system: Secondary | ICD-10-CM | POA: Insufficient documentation

## 2013-07-17 DIAGNOSIS — J029 Acute pharyngitis, unspecified: Secondary | ICD-10-CM | POA: Insufficient documentation

## 2013-07-17 DIAGNOSIS — Z8669 Personal history of other diseases of the nervous system and sense organs: Secondary | ICD-10-CM | POA: Insufficient documentation

## 2013-07-17 DIAGNOSIS — J45901 Unspecified asthma with (acute) exacerbation: Secondary | ICD-10-CM | POA: Insufficient documentation

## 2013-07-17 DIAGNOSIS — B349 Viral infection, unspecified: Secondary | ICD-10-CM

## 2013-07-17 DIAGNOSIS — R05 Cough: Secondary | ICD-10-CM

## 2013-07-17 DIAGNOSIS — B9789 Other viral agents as the cause of diseases classified elsewhere: Secondary | ICD-10-CM | POA: Insufficient documentation

## 2013-07-17 DIAGNOSIS — Z79899 Other long term (current) drug therapy: Secondary | ICD-10-CM | POA: Insufficient documentation

## 2013-07-17 DIAGNOSIS — I10 Essential (primary) hypertension: Secondary | ICD-10-CM | POA: Insufficient documentation

## 2013-07-17 NOTE — ED Provider Notes (Signed)
CSN: [id number]     Arrival date & time 07/17/13  1339 History   First MD Initiated Contact with Patient 07/17/13 1348     Chief Complaint  Patient presents with  . Generalized Body Aches  . Asthma  . Cough   (Consider location/radiation/quality/duration/timing/severity/associated sxs/prior Treatment) HPI Comments: Pt is here today for cough,sore throat, subjective fever, body aches and sob:pt states that she has not needed her inhaler more then normal. Denies vomiting/diarrhea, cp, dysuria  The history is provided by the patient. No language interpreter was used.    Past Medical History  Diagnosis Date  . Pseudotumor cerebri   . Asthma   . GERD (gastroesophageal reflux disease)   . Hypertension    Past Surgical History  Procedure Laterality Date  . Cholecystectomy    . Achilles tendon lengthening    . Abdominal hysterectomy    . Lumbar puncture  12/04/2011    dr. Gerilyn Pilgrim   Family History  Problem Relation Age of Onset  . Migraines Mother   . Cancer Maternal Uncle   . Cancer Maternal Uncle   . Cancer Paternal Grandmother   . Cancer Paternal Grandfather    History  Substance Use Topics  . Smoking status: Never Smoker   . Smokeless tobacco: Never Used  . Alcohol Use: No     Comment: occasionally   OB History   Grav Para Term Preterm Abortions TAB SAB Ect Mult Living   3 3 3       3      Review of Systems  Constitutional: Negative.   Respiratory: Positive for cough and shortness of breath.   Cardiovascular: Negative.     Allergies  Review of patient's allergies indicates no known allergies.  Home Medications   Current Outpatient Rx  Name  Route  Sig  Dispense  Refill  . albuterol (PROVENTIL HFA;VENTOLIN HFA) 108 (90 BASE) MCG/ACT inhaler   Inhalation   Inhale 2 puffs into the lungs every 4 (four) hours as needed for wheezing or shortness of breath. For shortness of breath         . ALPRAZolam (XANAX) 1 MG tablet   Oral   Take 1 mg by mouth 4 (four)  times daily as needed for sleep or anxiety. For anxiety         . Aspirin-Caffeine (BC FAST PAIN RELIEF PO)   Oral   Take 2 Packages by mouth daily as needed. For headache         . budesonide-formoterol (SYMBICORT) 80-4.5 MCG/ACT inhaler   Inhalation   Inhale 2 puffs into the lungs 2 (two) times daily.          . meloxicam (MOBIC) 7.5 MG tablet   Oral   Take 2 tablets (15 mg total) by mouth daily as needed for pain.   30 tablet   0   . oxyCODONE (OXY IR/ROXICODONE) 5 MG immediate release tablet   Oral   Take 1 tablet (5 mg total) by mouth every 4 (four) hours as needed for pain (breakthrough pain medication).   30 tablet   0   . oxyCODONE-acetaminophen (PERCOCET/ROXICET) 5-325 MG per tablet   Oral   Take 2 tablets by mouth every 4 (four) hours as needed.   30 tablet   0    BP 140/93  Pulse 81  Temp(Src) 99.1 F (37.3 C) (Oral)  Resp 18  Ht 5\' 10"  (1.778 m)  Wt 250 lb (113.399 kg)  BMI 35.87 kg/m2  SpO2 96%  LMP 11/11/2011 Physical Exam  Vitals reviewed. Constitutional: She is oriented to person, place, and time. She appears well-developed and well-nourished.  HENT:  Head: Normocephalic and atraumatic.  Right Ear: External ear normal.  Left Ear: External ear normal.  Mouth/Throat: Posterior oropharyngeal erythema present.  Eyes: Conjunctivae and EOM are normal.  Neck: Normal range of motion. Neck supple.  Cardiovascular: Normal rate and regular rhythm.   Pulmonary/Chest: Effort normal. She has rales.  Musculoskeletal: Normal range of motion.  Neurological: She is alert and oriented to person, place, and time.  Skin: Skin is warm and dry.  Psychiatric: She has a normal mood and affect.    ED Course  Procedures (including critical care time) Labs Review Labs Reviewed - No data to display Imaging Review Dg Chest 2 View  07/17/2013   CLINICAL DATA:  Cough. Fever. Diaphoresis. Chills.  EXAM: CHEST  2 VIEW  COMPARISON:  CHEST x-ray 10/04/2012.   FINDINGS: Lung volumes are normal. No consolidative airspace disease. No pleural effusions. No evidence of pulmonary edema. Heart size appears borderline enlarged. Mediastinal contours are unremarkable. No pneumothorax.  IMPRESSION: No radiographic evidence of acute cardiopulmonary disease.   Electronically Signed   By: Trudie Reed M.D.   On: 07/17/2013 14:53    EKG Interpretation   None       MDM   1. Cough   2. Viral illness    No pneumonia noted on x-ray:pts vitals are stable:pt is okay to follow up with pcp as needed:no need for antibiotics at this time    Teressa Lower, NP 07/17/13 1557

## 2013-07-17 NOTE — ED Notes (Signed)
Pt teaching done viral infection and how contagious to other.

## 2013-07-17 NOTE — ED Provider Notes (Signed)
Medical screening examination/treatment/procedure(s) were performed by non-physician practitioner and as supervising physician I was immediately available for consultation/collaboration.  Layla Maw Finlee Milo, DO 07/17/13 2007

## 2013-07-17 NOTE — ED Notes (Signed)
Per pt sts cough, sore, throat, SOB, body aches, cold sweats.

## 2013-08-05 ENCOUNTER — Emergency Department (HOSPITAL_COMMUNITY)
Admission: EM | Admit: 2013-08-05 | Discharge: 2013-08-05 | Disposition: A | Discharge status: Home / Self Care | Payer: 59 | Attending: Emergency Medicine | Admitting: Emergency Medicine

## 2013-08-05 ENCOUNTER — Encounter (HOSPITAL_COMMUNITY): Payer: Self-pay | Admitting: Emergency Medicine

## 2013-08-05 ENCOUNTER — Emergency Department (HOSPITAL_COMMUNITY): Payer: 59

## 2013-08-05 DIAGNOSIS — Z3202 Encounter for pregnancy test, result negative: Secondary | ICD-10-CM | POA: Insufficient documentation

## 2013-08-05 DIAGNOSIS — Z8669 Personal history of other diseases of the nervous system and sense organs: Secondary | ICD-10-CM | POA: Insufficient documentation

## 2013-08-05 DIAGNOSIS — J45909 Unspecified asthma, uncomplicated: Secondary | ICD-10-CM | POA: Insufficient documentation

## 2013-08-05 DIAGNOSIS — R42 Dizziness and giddiness: Secondary | ICD-10-CM | POA: Insufficient documentation

## 2013-08-05 DIAGNOSIS — Z79899 Other long term (current) drug therapy: Secondary | ICD-10-CM | POA: Insufficient documentation

## 2013-08-05 DIAGNOSIS — T481X5A Adverse effect of skeletal muscle relaxants [neuromuscular blocking agents], initial encounter: Secondary | ICD-10-CM | POA: Insufficient documentation

## 2013-08-05 DIAGNOSIS — T50905A Adverse effect of unspecified drugs, medicaments and biological substances, initial encounter: Secondary | ICD-10-CM

## 2013-08-05 DIAGNOSIS — I10 Essential (primary) hypertension: Secondary | ICD-10-CM | POA: Insufficient documentation

## 2013-08-05 DIAGNOSIS — R5381 Other malaise: Secondary | ICD-10-CM | POA: Insufficient documentation

## 2013-08-05 DIAGNOSIS — Z8719 Personal history of other diseases of the digestive system: Secondary | ICD-10-CM | POA: Insufficient documentation

## 2013-08-05 LAB — BASIC METABOLIC PANEL
CO2: 23 mEq/L (ref 19–32)
Chloride: 106 mEq/L (ref 96–112)
Glucose, Bld: 107 mg/dL — ABNORMAL HIGH (ref 70–99)
Sodium: 139 mEq/L (ref 135–145)

## 2013-08-05 LAB — RAPID URINE DRUG SCREEN, HOSP PERFORMED
Amphetamines: NOT DETECTED
Cocaine: NOT DETECTED
Opiates: NOT DETECTED
Tetrahydrocannabinol: NOT DETECTED

## 2013-08-05 LAB — PREGNANCY, URINE: Preg Test, Ur: NEGATIVE

## 2013-08-05 LAB — CBC WITH DIFFERENTIAL/PLATELET
Basophils Absolute: 0 10*3/uL (ref 0.0–0.1)
HCT: 39.1 % (ref 36.0–46.0)
Hemoglobin: 13.6 g/dL (ref 12.0–15.0)
Lymphocytes Relative: 31 % (ref 12–46)
Lymphs Abs: 2.1 10*3/uL (ref 0.7–4.0)
Monocytes Absolute: 0.7 10*3/uL (ref 0.1–1.0)
Neutro Abs: 3.8 10*3/uL (ref 1.7–7.7)
Platelets: 360 10*3/uL (ref 150–400)
RBC: 4.53 MIL/uL (ref 3.87–5.11)
RDW: 13.8 % (ref 11.5–15.5)
WBC: 6.9 10*3/uL (ref 4.0–10.5)

## 2013-08-05 LAB — ETHANOL: Alcohol, Ethyl (B): <11 mg/dL (ref 0–11)

## 2013-08-05 LAB — TROPONIN I: Troponin I: <0.3 ng/mL (ref <0.30)

## 2013-08-05 MED ORDER — SODIUM CHLORIDE 0.9 % IV SOLN: INTRAVENOUS | Status: DC

## 2013-08-05 NOTE — ED Notes (Signed)
Dr. Hessie Diener at bedside.

## 2013-08-05 NOTE — ED Notes (Signed)
Discharge instructions reviewed with pt. Pt verbalized understanding.   

## 2013-08-05 NOTE — ED Notes (Signed)
Phlebotomy at bedside.

## 2013-08-05 NOTE — ED Notes (Signed)
Pt in via EMS from home.  Pt was prescribed Mobic recently for CP d/t coughing from a cold.  Pt took 5 Mobic and also took 5 Flexeril (husband's) yesterday to relieve her pain.  Per pt's husband, pt went to bed around 0115 and was normal at that time.  He states he thinks she took some pills right before she went to bed.  This morning when they tried to wake pt, she would not wake up.  Pt responds to verbal stimuli, but appears very sleepy.

## 2013-08-05 NOTE — ED Notes (Signed)
ekd done by candice on 3rd shift.  Made copies of ekg from today and old ekg and gave to dr. Freida Busman.l

## 2013-08-05 NOTE — ED Provider Notes (Signed)
CSN: [id number]     Arrival date & time 08/05/13  4098 History   First MD Initiated Contact with Patient 08/05/13 [phone]     Chief Complaint  Patient presents with  . Altered Mental Status  . Dizziness   (Consider location/radiation/quality/duration/timing/severity/associated sxs/prior Treatment) Patient is a 37 y.o. female presenting with altered mental status. The history is provided by the patient and the spouse.  Altered Mental Status  patient here after her husband had trouble waking her up this morning. She admits to taking Flexeril last night because of constant chest pain that she has had x1 week. No associated diaphoresis or dyspnea with this. Pain is been persistent. Some cough noted. Patient also takes Xanax and Roxicodone as needed for pain. She denies that this was a suicide attempt. No recent fevers or head trauma. She has become more responsive but was brought in by EMS  Past Medical History  Diagnosis Date  . Pseudotumor cerebri   . Asthma   . GERD (gastroesophageal reflux disease)   . Hypertension    Past Surgical History  Procedure Laterality Date  . Cholecystectomy    . Achilles tendon lengthening    . Abdominal hysterectomy    . Lumbar puncture  12/04/2011    dr. Gerilyn Pilgrim   Family History  Problem Relation Age of Onset  . Migraines Mother   . Cancer Maternal Uncle   . Cancer Maternal Uncle   . Cancer Paternal Grandmother   . Cancer Paternal Grandfather    History  Substance Use Topics  . Smoking status: Never Smoker   . Smokeless tobacco: Never Used  . Alcohol Use: No     Comment: occasionally   OB History   Grav Para Term Preterm Abortions TAB SAB Ect Mult Living   3 3 3       3      Review of Systems  All other systems reviewed and are negative.    Allergies  Review of patient's allergies indicates no known allergies.  Home Medications   Current Outpatient Rx  Name  Route  Sig  Dispense  Refill  . meloxicam (MOBIC) 7.5 MG tablet   Oral    Take 2 tablets (15 mg total) by mouth daily as needed for pain.   30 tablet   0   . albuterol (PROVENTIL HFA;VENTOLIN HFA) 108 (90 BASE) MCG/ACT inhaler   Inhalation   Inhale 2 puffs into the lungs every 4 (four) hours as needed for wheezing or shortness of breath. For shortness of breath         . ALPRAZolam (XANAX) 1 MG tablet   Oral   Take 1 mg by mouth 4 (four) times daily as needed for sleep or anxiety. For anxiety         . Aspirin-Caffeine (BC FAST PAIN RELIEF PO)   Oral   Take 2 Packages by mouth daily as needed. For headache         . budesonide-formoterol (SYMBICORT) 80-4.5 MCG/ACT inhaler   Inhalation   Inhale 2 puffs into the lungs 2 (two) times daily.          Marland Kitchen oxyCODONE (OXY IR/ROXICODONE) 5 MG immediate release tablet   Oral   Take 1 tablet (5 mg total) by mouth every 4 (four) hours as needed for pain (breakthrough pain medication).   30 tablet   0   . oxyCODONE-acetaminophen (PERCOCET/ROXICET) 5-325 MG per tablet   Oral   Take 2 tablets by mouth every 4 (four)  hours as needed.   30 tablet   0    BP 140/93  Pulse 65  Temp(Src) 97.8 F (36.6 C) (Oral)  Resp 20  SpO2 100%  LMP 11/11/2011 Physical Exam  Nursing note and vitals reviewed. Constitutional: She is oriented to person, place, and time. She appears well-developed and well-nourished. She appears lethargic. She is cooperative.  Non-toxic appearance. No distress.  HENT:  Head: Normocephalic and atraumatic.  Eyes: Conjunctivae, EOM and lids are normal. Pupils are equal, round, and reactive to light.  Neck: Normal range of motion. Neck supple. No tracheal deviation present. No mass present.  Cardiovascular: Normal rate, regular rhythm and normal heart sounds.  Exam reveals no gallop.   No murmur heard. Pulmonary/Chest: Effort normal and breath sounds normal. No stridor. No respiratory distress. She has no decreased breath sounds. She has no wheezes. She has no rhonchi. She has no rales.   Abdominal: Soft. Normal appearance and bowel sounds are normal. She exhibits no distension. There is no tenderness. There is no rebound and no CVA tenderness.  Musculoskeletal: Normal range of motion. She exhibits no edema and no tenderness.  Neurological: She is oriented to person, place, and time. She has normal strength. She appears lethargic. No cranial nerve deficit or sensory deficit. GCS eye subscore is 4. GCS verbal subscore is 5. GCS motor subscore is 6.  Skin: Skin is warm and dry. No abrasion and no rash noted.  Psychiatric: She has a normal mood and affect. Her speech is normal and behavior is normal.    ED Course  Procedures (including critical care time) Labs Review Labs Reviewed  ETHANOL  URINE RAPID DRUG SCREEN (HOSP PERFORMED)  D-DIMER, QUANTITATIVE  TROPONIN I  CBC WITH DIFFERENTIAL  BASIC METABOLIC PANEL   Imaging Review No results found.  EKG Interpretation     Ventricular Rate:  65 PR Interval:  176 QRS Duration: 86 QT Interval:  453 QTC Calculation: 471 R Axis:   21 Text Interpretation:  Sinus rhythm Left atrial enlargement Anterior infarct, old No significant change since last tracing            MDM  No diagnosis found.  11:06 AM  Patient monitored here and is now awake and alert. She denies having chest pain this time. Doubt ACS or PE. Suspect the cause of her symptoms was from excessive Flexeril use. Stable for discharge    Toy Baker, MD 08/05/13 1106

## 2013-08-12 ENCOUNTER — Emergency Department (HOSPITAL_COMMUNITY)
Admission: EM | Admit: 2013-08-12 | Discharge: 2013-08-12 | Disposition: A | Discharge status: Home / Self Care | Payer: 59 | Attending: Emergency Medicine | Admitting: Emergency Medicine

## 2013-08-12 ENCOUNTER — Encounter (HOSPITAL_COMMUNITY): Payer: Self-pay | Admitting: Emergency Medicine

## 2013-08-12 ENCOUNTER — Emergency Department (HOSPITAL_COMMUNITY): Payer: 59

## 2013-08-12 DIAGNOSIS — IMO0002 Reserved for concepts with insufficient information to code with codable children: Secondary | ICD-10-CM | POA: Insufficient documentation

## 2013-08-12 DIAGNOSIS — Z8719 Personal history of other diseases of the digestive system: Secondary | ICD-10-CM | POA: Insufficient documentation

## 2013-08-12 DIAGNOSIS — R42 Dizziness and giddiness: Secondary | ICD-10-CM | POA: Insufficient documentation

## 2013-08-12 DIAGNOSIS — J45909 Unspecified asthma, uncomplicated: Secondary | ICD-10-CM | POA: Insufficient documentation

## 2013-08-12 DIAGNOSIS — I10 Essential (primary) hypertension: Secondary | ICD-10-CM | POA: Insufficient documentation

## 2013-08-12 DIAGNOSIS — R112 Nausea with vomiting, unspecified: Secondary | ICD-10-CM | POA: Insufficient documentation

## 2013-08-12 DIAGNOSIS — Z3202 Encounter for pregnancy test, result negative: Secondary | ICD-10-CM | POA: Insufficient documentation

## 2013-08-12 DIAGNOSIS — Z8669 Personal history of other diseases of the nervous system and sense organs: Secondary | ICD-10-CM | POA: Insufficient documentation

## 2013-08-12 DIAGNOSIS — Z79899 Other long term (current) drug therapy: Secondary | ICD-10-CM | POA: Insufficient documentation

## 2013-08-12 DIAGNOSIS — R51 Headache: Secondary | ICD-10-CM | POA: Insufficient documentation

## 2013-08-12 LAB — COMPREHENSIVE METABOLIC PANEL
AST: 20 U/L (ref 0–37)
BUN: 8 mg/dL (ref 6–23)
CO2: 26 mEq/L (ref 19–32)
Calcium: 8.6 mg/dL (ref 8.4–10.5)
Chloride: 102 mEq/L (ref 96–112)
Creatinine, Ser: 0.64 mg/dL (ref 0.50–1.10)
GFR calc Af Amer: >90 mL/min (ref >90)
GFR calc non Af Amer: >90 mL/min (ref >90)
Glucose, Bld: 91 mg/dL (ref 70–99)
Total Bilirubin: 0.2 mg/dL — ABNORMAL LOW (ref 0.3–1.2)

## 2013-08-12 LAB — CBC WITH DIFFERENTIAL/PLATELET
Basophils Absolute: 0 10*3/uL (ref 0.0–0.1)
Eosinophils Absolute: 0.2 10*3/uL (ref 0.0–0.7)
Eosinophils Relative: 3 % (ref 0–5)
HCT: 36.1 % (ref 36.0–46.0)
Hemoglobin: 12.4 g/dL (ref 12.0–15.0)
Lymphocytes Relative: 17 % (ref 12–46)
Lymphs Abs: 1.2 10*3/uL (ref 0.7–4.0)
MCH: 29.4 pg (ref 26.0–34.0)
Monocytes Absolute: 0.7 10*3/uL (ref 0.1–1.0)
Monocytes Relative: 11 % (ref 3–12)
Neutro Abs: 4.7 10*3/uL (ref 1.7–7.7)
WBC: 6.8 10*3/uL (ref 4.0–10.5)

## 2013-08-12 MED ORDER — DEXAMETHASONE SODIUM PHOSPHATE 10 MG/ML IJ SOLN
10.0000 mg | Freq: Once | INTRAMUSCULAR | Status: AC
  Administered 2013-08-12: 10 mg via INTRAVENOUS
  Filled 2013-08-12: qty 1

## 2013-08-12 MED ORDER — DIPHENHYDRAMINE HCL 50 MG/ML IJ SOLN
25.0000 mg | Freq: Once | INTRAMUSCULAR | Status: AC
  Administered 2013-08-12: 25 mg via INTRAVENOUS
  Filled 2013-08-12: qty 1

## 2013-08-12 MED ORDER — METOCLOPRAMIDE HCL 5 MG/ML IJ SOLN
10.0000 mg | Freq: Once | INTRAMUSCULAR | Status: AC
  Administered 2013-08-12: 10 mg via INTRAVENOUS
  Filled 2013-08-12: qty 2

## 2013-08-12 MED ORDER — ONDANSETRON HCL 4 MG PO TABS: 4.0000 mg | ORAL_TABLET | Freq: Four times a day (QID) | ORAL | Status: DC

## 2013-08-12 MED ORDER — ACETAZOLAMIDE 125 MG PO TABS: 250.0000 mg | ORAL_TABLET | Freq: Two times a day (BID) | ORAL | Status: DC

## 2013-08-12 MED ORDER — SODIUM CHLORIDE 0.9 % IV BOLUS (SEPSIS)
1000.0000 mL | Freq: Once | INTRAVENOUS | Status: AC
  Administered 2013-08-12: 1000 mL via INTRAVENOUS

## 2013-08-12 MED ORDER — TOPIRAMATE 25 MG PO TABS: 25.0000 mg | ORAL_TABLET | Freq: Two times a day (BID) | ORAL | Status: DC

## 2013-08-12 MED ORDER — HYDROCODONE-ACETAMINOPHEN 5-325 MG PO TABS: 1.0000 | ORAL_TABLET | Freq: Four times a day (QID) | ORAL | Status: DC | PRN

## 2013-08-12 NOTE — ED Notes (Signed)
Family at bedside. 

## 2013-08-12 NOTE — ED Notes (Signed)
Pt states she started vomiting yesterday. Went to PCP, prescribed abx but did not pick up. Pt states she woke up this morning still nauseas, vomiting and dizzy. Pt c/o HA.

## 2013-08-12 NOTE — ED Provider Notes (Signed)
CSN: [id number]     Arrival date & time 08/12/13  8657 History   First MD Initiated Contact with Patient 08/12/13 [phone]     Chief Complaint  Patient presents with  . Dizziness  . Emesis   (Consider location/radiation/quality/duration/timing/severity/associated sxs/prior Treatment) HPI Comments: Pt is a 37 y.o. female with Pmhx as above who presents with several days of URI type symptoms w/ cough, congestion, rhinorrhea, then began developing h/a, nausea, vomiting, dizziness & blurry vision since last night.  >20 episodes of non-bloody non-bilious emesis.  Pt states she feels dizzy at rest, but is much worse w/ mvmt. Hx of pseudotumor, has not been compliant w/ meds for past several months.  Pseudotumor symptoms in past include h/a, dizziness, blurry vision, but has not had vomiting.  No fever. No neck stiffness  Patient is a 37 y.o. female presenting with vomiting and neurologic complaint. The history is provided by the patient. No language interpreter was used.  Emesis Severity:  Severe Duration:  12 hours Timing:  Constant Quality:  Undigested food, bilious material and stomach contents Progression:  Unchanged Chronicity:  New Recent urination:  Normal Relieved by:  Nothing Worsened by:  Food smell, ice chips and liquids Associated symptoms: cough, headaches and URI   Associated symptoms: no abdominal pain, no arthralgias, no chills, no diarrhea, no fever and no sore throat   Cough:    Cough characteristics:  Dry   Sputum characteristics:  Nondescript   Severity:  Moderate   Onset quality:  Gradual   Cough duration: several days.   Timing:  Constant   Progression:  Unchanged   Chronicity:  New Neurologic Problem This is a new problem. The current episode started yesterday. The problem occurs constantly. The problem has not changed since onset.Associated symptoms include headaches. Pertinent negatives include no chest pain, no abdominal pain and no shortness of breath. Associated  symptoms comments: Dizziness, blurry vision. Exacerbated by: movement. Relieved by: improved, but not relieved by being still. She has tried rest for the symptoms. The treatment provided no relief.    Past Medical History  Diagnosis Date  . Pseudotumor cerebri   . Asthma   . GERD (gastroesophageal reflux disease)   . Hypertension    Past Surgical History  Procedure Laterality Date  . Cholecystectomy    . Achilles tendon lengthening    . Abdominal hysterectomy    . Lumbar puncture  12/04/2011    dr. Gerilyn Pilgrim   Family History  Problem Relation Age of Onset  . Migraines Mother   . Cancer Maternal Uncle   . Cancer Maternal Uncle   . Cancer Paternal Grandmother   . Cancer Paternal Grandfather    History  Substance Use Topics  . Smoking status: Never Smoker   . Smokeless tobacco: Never Used  . Alcohol Use: No     Comment: occasionally   OB History   Grav Para Term Preterm Abortions TAB SAB Ect Mult Living   3 3 3       3      Review of Systems  Constitutional: Negative for fever, chills, diaphoresis, activity change, appetite change and fatigue.  HENT: Negative for congestion, facial swelling, rhinorrhea and sore throat.   Eyes: Negative for photophobia and discharge.  Respiratory: Negative for cough, chest tightness and shortness of breath.   Cardiovascular: Negative for chest pain, palpitations and leg swelling.  Gastrointestinal: Positive for vomiting. Negative for nausea, abdominal pain and diarrhea.  Endocrine: Negative for polydipsia and polyuria.  Genitourinary: Negative  for dysuria, frequency, difficulty urinating and pelvic pain.  Musculoskeletal: Negative for arthralgias, back pain, neck pain and neck stiffness.  Skin: Negative for color change and wound.  Allergic/Immunologic: Negative for immunocompromised state.  Neurological: Positive for dizziness and headaches. Negative for facial asymmetry, weakness and numbness.  Hematological: Does not bruise/bleed  easily.  Psychiatric/Behavioral: Negative for confusion and agitation.    Allergies  Review of patient's allergies indicates no known allergies.  Home Medications   Current Outpatient Rx  Name  Route  Sig  Dispense  Refill  . albuterol (PROVENTIL HFA;VENTOLIN HFA) 108 (90 BASE) MCG/ACT inhaler   Inhalation   Inhale 2 puffs into the lungs every 4 (four) hours as needed for wheezing or shortness of breath. For shortness of breath         . ALPRAZolam (XANAX) 1 MG tablet   Oral   Take 1 mg by mouth 4 (four) times daily as needed for sleep or anxiety. For anxiety         . budesonide-formoterol (SYMBICORT) 80-4.5 MCG/ACT inhaler   Inhalation   Inhale 2 puffs into the lungs 2 (two) times daily.          Marland Kitchen acetaZOLAMIDE (DIAMOX) 125 MG tablet   Oral   Take 2 tablets (250 mg total) by mouth 2 (two) times daily.   60 tablet   0   . HYDROcodone-acetaminophen (NORCO/VICODIN) 5-325 MG per tablet   Oral   Take 1 tablet by mouth every 6 (six) hours as needed for moderate pain or severe pain.   10 tablet   0   . ondansetron (ZOFRAN) 4 MG tablet   Oral   Take 1 tablet (4 mg total) by mouth every 6 (six) hours.   15 tablet   0   . topiramate (TOPAMAX) 25 MG tablet   Oral   Take 1 tablet (25 mg total) by mouth 2 (two) times daily.   60 tablet   0    BP 141/76  Pulse 70  Temp(Src) 98.4 F (36.9 C) (Oral)  Resp 14  SpO2 99%  LMP 11/11/2011 Physical Exam  Constitutional: She is oriented to person, place, and time. She appears well-developed and well-nourished. No distress.  HENT:  Head: Normocephalic and atraumatic.  Mouth/Throat: No oropharyngeal exudate.  Eyes: Pupils are equal, round, and reactive to light.  Neck: Normal range of motion. Neck supple.  Cardiovascular: Normal rate, regular rhythm and normal heart sounds.  Exam reveals no gallop and no friction rub.   No murmur heard. Pulmonary/Chest: Effort normal and breath sounds normal. No respiratory distress.  She has no wheezes. She has no rales.  Abdominal: Soft. Bowel sounds are normal. She exhibits no distension and no mass. There is no tenderness. There is no rebound and no guarding.  Musculoskeletal: Normal range of motion. She exhibits no edema and no tenderness.  Neurological: She is alert and oriented to person, place, and time. She has normal strength. She displays no tremor. No cranial nerve deficit or sensory deficit. She exhibits normal muscle tone. Coordination normal. GCS eye subscore is 4. GCS verbal subscore is 5. GCS motor subscore is 6.  Mild ataxia with ambulation  Skin: Skin is warm and dry.  Psychiatric: She has a normal mood and affect.    ED Course  Procedures (including critical care time) Labs Review Labs Reviewed  COMPREHENSIVE METABOLIC PANEL - Abnormal; Notable for the following:    Total Bilirubin 0.2 (*)    All other components within  normal limits  CBC WITH DIFFERENTIAL  POCT PREGNANCY, URINE   Imaging Review Ct Head Wo Contrast  08/12/2013   CLINICAL DATA:  Headaches, dizziness, and vomiting in a patient with history of pseudotumor cerebri.  EXAM: CT HEAD WITHOUT CONTRAST  TECHNIQUE: Contiguous axial images were obtained from the base of the skull through the vertex without intravenous contrast.  COMPARISON:  Multiple priors  FINDINGS: No mass lesion. No midline shift. No acute hemorrhage or hematoma. No extra-axial fluid collections. No evidence of acute infarction. Calvarium is intact. The visualized paranasal sinuses and mastoid air cells are clear. Orbital soft tissues are unremarkable.  IMPRESSION: No acute intracranial disease.   Electronically Signed   By: Jerene Dilling M.D.   On: 08/12/2013 12:14    EKG Interpretation   None       MDM   1. Nausea & vomiting   2. Headache    Pt is a 37 y.o. female with Pmhx as above who presents with several days of URI type symptoms w/ cough, congestion, rhinorrhea, then began developing h/a, nausea,  vomiting, dizziness & blurry vision since last night.  >20 episodes of non-bloody non-bilious emesis.  Pt states she feels dizzy at rest, but is much worse w/ mvmt. Hx of pseudotumor, has not been compliant w/ meds for past several months.  Pseudotumor symptoms in past include h/a, dizziness, blurry vision, but has not had vomiting.  On PE, VSS, pt in NAD.  Cardiopulm exam, and abdominal exam benign.  No focal neuro findings other than mild ataxia and symptomatic dizziness on ambulation. Pt has inc dizziness w/ position change.  No Nystagmus. Unable to clearly see optic disks.   CBC, CMP unremarkable.   Pt feels h/a, & dizziness only slightly better w/ migraine cocktail. Emesis resolved. I have concern that neuro symptoms are due to under treated pseudotumor. Doubt CVA, TIA, meningitis, intracranial hemorrhage.  I have spoken to pt about LP for opening pressure/removal of CSF, she does not want LP to be done in ED given hx difficult blind LPs.  She will consent to LP under fluoroscopy.  CT head nml.   Radiology will be unable to do LP today as pt states she cannot stay for required 3hrs laying supine after procedure and cannot lay flat for recommended 12 hrs at home as she states she must leave to pick up husband and go to work.  Radiology does not feel it would be wise to proceed due to prior hx of CSF leaks w/ multiple blood patches.  Radiology has scheduled pt for therapeutic LP Monday 10:30am.  I believe pt able to make this decision.  Will restart home diamox & topamax.  She can call her neurologist on Monday for f/u. Return precautions given for new or worsening symptoms including worsening h/a, fever, return of uncontrolled emesis, numbness, weakness.          Shanna Cisco, MD 08/12/13 [phone]
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
## 2013-08-12 NOTE — ED Notes (Signed)
Pt to CT and returned.  Immediately upon return pt taken back to radiology.

## 2013-08-15 ENCOUNTER — Other Ambulatory Visit (HOSPITAL_COMMUNITY): Payer: Self-pay | Admitting: Emergency Medicine

## 2013-08-15 ENCOUNTER — Ambulatory Visit (HOSPITAL_COMMUNITY): Payer: 59

## 2013-08-15 ENCOUNTER — Ambulatory Visit (HOSPITAL_COMMUNITY)
Admission: RE | Admit: 2013-08-15 | Discharge: 2013-08-15 | Disposition: A | Discharge status: Home / Self Care | Payer: 59 | Source: Ambulatory Visit | Attending: Emergency Medicine | Admitting: Emergency Medicine

## 2013-08-15 ENCOUNTER — Ambulatory Visit (HOSPITAL_COMMUNITY): Admit: 2013-08-15 | Payer: 59

## 2013-08-15 DIAGNOSIS — H538 Other visual disturbances: Secondary | ICD-10-CM

## 2013-08-15 DIAGNOSIS — R42 Dizziness and giddiness: Secondary | ICD-10-CM

## 2013-08-15 DIAGNOSIS — R51 Headache: Secondary | ICD-10-CM | POA: Insufficient documentation

## 2013-08-15 MED ORDER — ACETAMINOPHEN 325 MG PO TABS: 650.0000 mg | ORAL_TABLET | ORAL | Status: DC | PRN

## 2013-08-15 MED ORDER — ALBUTEROL SULFATE (5 MG/ML) 0.5% IN NEBU
2.5000 mg | INHALATION_SOLUTION | Freq: Once | RESPIRATORY_TRACT | Status: AC
  Administered 2013-08-15: 2.5 mg via RESPIRATORY_TRACT

## 2013-08-15 MED ORDER — ALBUTEROL SULFATE (5 MG/ML) 0.5% IN NEBU
INHALATION_SOLUTION | RESPIRATORY_TRACT | Status: AC
  Filled 2013-08-15: qty 0.5

## 2013-08-15 NOTE — Procedures (Signed)
Lumbar puncture performed at L3-4 without complication. Please see full in report with associated imaging.

## 2013-08-15 NOTE — Progress Notes (Signed)
Discharge instruction given per MD order.  PT and CG verbalize understanding.    Pt to car via wheelchair.

## 2013-08-15 NOTE — Progress Notes (Signed)
Respiratory txt  Completed.  Pt stated her breathing was better

## 2013-08-15 NOTE — Progress Notes (Signed)
Pt received d from Radiology alert and able to tolerate food and fluids.  Pt SOB O2 started at 2 liters and order received for Resp txmt.Marland Kitchen

## 2013-08-18 ENCOUNTER — Other Ambulatory Visit (HOSPITAL_COMMUNITY): Payer: Self-pay | Admitting: Family Medicine

## 2013-08-18 ENCOUNTER — Ambulatory Visit (HOSPITAL_COMMUNITY)
Admission: RE | Admit: 2013-08-18 | Discharge: 2013-08-18 | Disposition: A | Discharge status: Home / Self Care | Payer: 59 | Source: Ambulatory Visit | Attending: Family Medicine | Admitting: Family Medicine

## 2013-08-18 DIAGNOSIS — R509 Fever, unspecified: Secondary | ICD-10-CM | POA: Insufficient documentation

## 2013-08-18 DIAGNOSIS — R05 Cough: Secondary | ICD-10-CM

## 2013-08-18 DIAGNOSIS — R0989 Other specified symptoms and signs involving the circulatory and respiratory systems: Secondary | ICD-10-CM | POA: Insufficient documentation

## 2013-08-18 DIAGNOSIS — R059 Cough, unspecified: Secondary | ICD-10-CM | POA: Insufficient documentation

## 2013-09-17 ENCOUNTER — Encounter (HOSPITAL_COMMUNITY): Payer: Self-pay | Admitting: Emergency Medicine

## 2013-09-17 ENCOUNTER — Emergency Department (HOSPITAL_COMMUNITY)
Admission: EM | Admit: 2013-09-17 | Discharge: 2013-09-17 | Disposition: A | Discharge status: Home / Self Care | Payer: 59 | Attending: Emergency Medicine | Admitting: Emergency Medicine

## 2013-09-17 DIAGNOSIS — J45909 Unspecified asthma, uncomplicated: Secondary | ICD-10-CM | POA: Insufficient documentation

## 2013-09-17 DIAGNOSIS — Y92410 Unspecified street and highway as the place of occurrence of the external cause: Secondary | ICD-10-CM | POA: Insufficient documentation

## 2013-09-17 DIAGNOSIS — S0990XA Unspecified injury of head, initial encounter: Secondary | ICD-10-CM | POA: Insufficient documentation

## 2013-09-17 DIAGNOSIS — Z79899 Other long term (current) drug therapy: Secondary | ICD-10-CM | POA: Insufficient documentation

## 2013-09-17 DIAGNOSIS — M62838 Other muscle spasm: Secondary | ICD-10-CM

## 2013-09-17 DIAGNOSIS — Y9389 Activity, other specified: Secondary | ICD-10-CM | POA: Insufficient documentation

## 2013-09-17 DIAGNOSIS — IMO0002 Reserved for concepts with insufficient information to code with codable children: Secondary | ICD-10-CM | POA: Insufficient documentation

## 2013-09-17 DIAGNOSIS — S139XXA Sprain of joints and ligaments of unspecified parts of neck, initial encounter: Secondary | ICD-10-CM | POA: Insufficient documentation

## 2013-09-17 DIAGNOSIS — Z8719 Personal history of other diseases of the digestive system: Secondary | ICD-10-CM | POA: Insufficient documentation

## 2013-09-17 DIAGNOSIS — Z8669 Personal history of other diseases of the nervous system and sense organs: Secondary | ICD-10-CM | POA: Insufficient documentation

## 2013-09-17 DIAGNOSIS — I10 Essential (primary) hypertension: Secondary | ICD-10-CM | POA: Insufficient documentation

## 2013-09-17 MED ORDER — DIAZEPAM 5 MG PO TABS
5.0000 mg | ORAL_TABLET | Freq: Once | ORAL | Status: AC
  Administered 2013-09-17: 5 mg via ORAL
  Filled 2013-09-17: qty 1

## 2013-09-17 MED ORDER — DIAZEPAM 5 MG PO TABS: 5.0000 mg | ORAL_TABLET | Freq: Four times a day (QID) | ORAL | Status: DC | PRN
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
## 2013-09-17 NOTE — ED Notes (Signed)
Pt given d/c information and verbalized understanding. NAD at this time. VS are stable.

## 2013-09-17 NOTE — ED Provider Notes (Signed)
Medical screening examination/treatment/procedure(s) were performed by non-physician practitioner and as supervising physician I was immediately available for consultation/collaboration.  EKG Interpretation   None         Darlys Gales, MD 09/17/13 1213

## 2013-09-19 ENCOUNTER — Other Ambulatory Visit (HOSPITAL_COMMUNITY): Payer: Self-pay | Admitting: Family Medicine

## 2013-09-19 ENCOUNTER — Ambulatory Visit (HOSPITAL_COMMUNITY)
Admission: RE | Admit: 2013-09-19 | Discharge: 2013-09-19 | Disposition: A | Discharge status: Home / Self Care | Payer: 59 | Source: Ambulatory Visit | Attending: Family Medicine | Admitting: Family Medicine

## 2013-09-19 DIAGNOSIS — M542 Cervicalgia: Secondary | ICD-10-CM | POA: Insufficient documentation

## 2013-11-19 ENCOUNTER — Emergency Department (HOSPITAL_COMMUNITY): Payer: BC Managed Care – PPO

## 2013-11-19 ENCOUNTER — Encounter (HOSPITAL_COMMUNITY): Payer: Self-pay | Admitting: Emergency Medicine

## 2013-11-19 ENCOUNTER — Emergency Department (HOSPITAL_COMMUNITY)
Admission: EM | Admit: 2013-11-19 | Discharge: 2013-11-20 | Disposition: A | Discharge status: Home / Self Care | Payer: BC Managed Care – PPO | Attending: Emergency Medicine | Admitting: Emergency Medicine

## 2013-11-19 DIAGNOSIS — I10 Essential (primary) hypertension: Secondary | ICD-10-CM | POA: Insufficient documentation

## 2013-11-19 DIAGNOSIS — Z8719 Personal history of other diseases of the digestive system: Secondary | ICD-10-CM | POA: Insufficient documentation

## 2013-11-19 DIAGNOSIS — J45909 Unspecified asthma, uncomplicated: Secondary | ICD-10-CM | POA: Insufficient documentation

## 2013-11-19 DIAGNOSIS — R6883 Chills (without fever): Secondary | ICD-10-CM | POA: Insufficient documentation

## 2013-11-19 DIAGNOSIS — K625 Hemorrhage of anus and rectum: Secondary | ICD-10-CM | POA: Insufficient documentation

## 2013-11-19 DIAGNOSIS — IMO0001 Reserved for inherently not codable concepts without codable children: Secondary | ICD-10-CM | POA: Insufficient documentation

## 2013-11-19 DIAGNOSIS — R0789 Other chest pain: Secondary | ICD-10-CM | POA: Insufficient documentation

## 2013-11-19 DIAGNOSIS — R209 Unspecified disturbances of skin sensation: Secondary | ICD-10-CM | POA: Insufficient documentation

## 2013-11-19 DIAGNOSIS — R51 Headache: Secondary | ICD-10-CM | POA: Insufficient documentation

## 2013-11-19 DIAGNOSIS — Z79899 Other long term (current) drug therapy: Secondary | ICD-10-CM | POA: Insufficient documentation

## 2013-11-19 DIAGNOSIS — IMO0002 Reserved for concepts with insufficient information to code with codable children: Secondary | ICD-10-CM | POA: Insufficient documentation

## 2013-11-19 DIAGNOSIS — R519 Headache, unspecified: Secondary | ICD-10-CM

## 2013-11-19 DIAGNOSIS — R42 Dizziness and giddiness: Secondary | ICD-10-CM | POA: Insufficient documentation

## 2013-11-19 LAB — HCG, SERUM, QUALITATIVE: PREG SERUM: NEGATIVE

## 2013-11-19 LAB — COMPREHENSIVE METABOLIC PANEL
ALT: 24 U/L (ref 0–35)
AST: 24 U/L (ref 0–37)
Albumin: 3.9 g/dL (ref 3.5–5.2)
Alkaline Phosphatase: 60 U/L (ref 39–117)
BUN: 12 mg/dL (ref 6–23)
CO2: 27 meq/L (ref 19–32)
CREATININE: 0.75 mg/dL (ref 0.50–1.10)
Calcium: 9.2 mg/dL (ref 8.4–10.5)
Chloride: 102 mEq/L (ref 96–112)
GFR calc Af Amer: >90 mL/min (ref >90)
GFR calc non Af Amer: >90 mL/min (ref >90)
Glucose, Bld: 76 mg/dL (ref 70–99)
Potassium: 3.6 mEq/L — ABNORMAL LOW (ref 3.7–5.3)
Sodium: 140 mEq/L (ref 137–147)
Total Bilirubin: <0.2 mg/dL — ABNORMAL LOW (ref 0.3–1.2)
Total Protein: 8.1 g/dL (ref 6.0–8.3)

## 2013-11-19 LAB — CBC WITH DIFFERENTIAL/PLATELET
BASOS ABS: 0 10*3/uL (ref 0.0–0.1)
Basophils Relative: 0 % (ref 0–1)
Eosinophils Absolute: 0.4 10*3/uL (ref 0.0–0.7)
Eosinophils Relative: 7 % — ABNORMAL HIGH (ref 0–5)
HEMATOCRIT: 39.9 % (ref 36.0–46.0)
HEMOGLOBIN: 13.6 g/dL (ref 12.0–15.0)
LYMPHS ABS: 2.2 10*3/uL (ref 0.7–4.0)
LYMPHS PCT: 43 % (ref 12–46)
MCH: 30 pg (ref 26.0–34.0)
MCHC: 34.1 g/dL (ref 30.0–36.0)
MCV: 87.9 fL (ref 78.0–100.0)
MONO ABS: 0.4 10*3/uL (ref 0.1–1.0)
MONOS PCT: 7 % (ref 3–12)
NEUTROS ABS: 2.2 10*3/uL (ref 1.7–7.7)
Neutrophils Relative %: 42 % — ABNORMAL LOW (ref 43–77)
Platelets: 362 10*3/uL (ref 150–400)
RBC: 4.54 MIL/uL (ref 3.87–5.11)
RDW: 13.5 % (ref 11.5–15.5)
WBC: 5.1 10*3/uL (ref 4.0–10.5)

## 2013-11-19 LAB — TROPONIN I: Troponin I: <0.3 ng/mL (ref <0.30)

## 2013-11-19 MED ORDER — MORPHINE SULFATE 4 MG/ML IJ SOLN
4.0000 mg | Freq: Once | INTRAMUSCULAR | Status: AC
Start: 2013-11-19 — End: 2013-11-19
  Administered 2013-11-19: 4 mg via INTRAVENOUS
  Filled 2013-11-19: qty 1

## 2013-11-19 MED ORDER — ASPIRIN 81 MG PO CHEW
324.0000 mg | CHEWABLE_TABLET | Freq: Once | ORAL | Status: AC
  Administered 2013-11-19: 324 mg via ORAL

## 2013-11-19 MED ORDER — ASPIRIN 81 MG PO CHEW
CHEWABLE_TABLET | ORAL | Status: AC
  Filled 2013-11-19: qty 4

## 2013-11-19 MED ORDER — PROCHLORPERAZINE EDISYLATE 5 MG/ML IJ SOLN
10.0000 mg | Freq: Once | INTRAMUSCULAR | Status: AC
  Administered 2013-11-19: 10 mg via INTRAVENOUS
  Filled 2013-11-19: qty 2
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
## 2013-11-20 MED ORDER — HYDROCODONE-ACETAMINOPHEN 5-325 MG PO TABS: 2.0000 | ORAL_TABLET | ORAL | Status: DC | PRN

## 2013-11-20 NOTE — Discharge Instructions (Signed)
Chest Pain (Nonspecific) °It is often hard to give a specific diagnosis for the cause of chest pain. There is always a chance that your pain could be related to something serious, such as a heart attack or a blood clot in the lungs. You need to follow up with your caregiver for further evaluation. °CAUSES  °· Heartburn. °· Pneumonia or bronchitis. °· Anxiety or stress. °· Inflammation around your heart (pericarditis) or lung (pleuritis or pleurisy). °· A blood clot in the lung. °· A collapsed lung (pneumothorax). It can develop suddenly on its own (spontaneous pneumothorax) or from injury (trauma) to the chest. °· Shingles infection (herpes zoster virus). °The chest wall is composed of bones, muscles, and cartilage. Any of these can be the source of the pain. °· The bones can be bruised by injury. °· The muscles or cartilage can be strained by coughing or overwork. °· The cartilage can be affected by inflammation and become sore (costochondritis). °DIAGNOSIS  °Lab tests or other studies, such as X-rays, electrocardiography, stress testing, or cardiac imaging, may be needed to find the cause of your pain.  °TREATMENT  °· Treatment depends on what may be causing your chest pain. Treatment may include: °· Acid blockers for heartburn. °· Anti-inflammatory medicine. °· Pain medicine for inflammatory conditions. °· Antibiotics if an infection is present. °· You may be advised to change lifestyle habits. This includes stopping smoking and avoiding alcohol, caffeine, and chocolate. °· You may be advised to keep your head raised (elevated) when sleeping. This reduces the chance of acid going backward from your stomach into your esophagus. °· Most of the time, nonspecific chest pain will improve within 2 to 3 days with rest and mild pain medicine. °HOME CARE INSTRUCTIONS  °· If antibiotics were prescribed, take your antibiotics as directed. Finish them even if you start to feel better. °· For the next few days, avoid physical  activities that bring on chest pain. Continue physical activities as directed. °· Do not smoke. °· Avoid drinking alcohol. °· Only take over-the-counter or prescription medicine for pain, discomfort, or fever as directed by your caregiver. °· Follow your caregiver's suggestions for further testing if your chest pain does not go away. °· Keep any follow-up appointments you made. If you do not go to an appointment, you could develop lasting (chronic) problems with pain. If there is any problem keeping an appointment, you must call to reschedule. °SEEK MEDICAL CARE IF:  °· You think you are having problems from the medicine you are taking. Read your medicine instructions carefully. °· Your chest pain does not go away, even after treatment. °· You develop a rash with blisters on your chest. °SEEK IMMEDIATE MEDICAL CARE IF:  °· You have increased chest pain or pain that spreads to your arm, neck, jaw, back, or abdomen. °· You develop shortness of breath, an increasing cough, or you are coughing up blood. °· You have severe back or abdominal pain, feel nauseous, or vomit. °· You develop severe weakness, fainting, or chills. °· You have a fever. °THIS IS AN EMERGENCY. Do not wait to see if the pain will go away. Get medical help at once. Call your local emergency services (911 in [place] Do not drive yourself to the hospital. °MAKE SURE YOU:  °· Understand these instructions. °· Will watch your condition. °· Will get help right away if you are not doing well or get worse. °Document Released: 07/02/2005 Document Revised: 12/15/2011 Document Reviewed: 04/27/2008 °[employer]® Patient Information ©2014 [employer]   [employer].  Hypertension As your heart beats, it forces blood through your arteries. This force is your blood pressure. If the pressure is too high, it is called hypertension (HTN) or high blood pressure. HTN is dangerous because you may have it and not know it. High blood pressure may mean that your heart has to work harder  to pump blood. Your arteries may be narrow or stiff. The extra work puts you at risk for heart disease, stroke, and other problems.  Blood pressure consists of two numbers, a higher number over a lower, 110/72, for example. It is stated as "110 over 72." The ideal is below 120 for the top number (systolic) and under 80 for the bottom (diastolic). Write down your blood pressure today. You should pay close attention to your blood pressure if you have certain conditions such as:  Heart failure.  Prior heart attack.  Diabetes  Chronic kidney disease.  Prior stroke.  Multiple risk factors for heart disease. To see if you have HTN, your blood pressure should be measured while you are seated with your arm held at the level of the heart. It should be measured at least twice. A one-time elevated blood pressure reading (especially in the Emergency Department) does not mean that you need treatment. There may be conditions in which the blood pressure is different between your right and left arms. It is important to see your caregiver soon for a recheck. Most people have essential hypertension which means that there is not a specific cause. This type of high blood pressure may be lowered by changing lifestyle factors such as:  Stress.  Smoking.  Lack of exercise.  Excessive weight.  Drug/tobacco/alcohol use.  Eating less salt. Most people do not have symptoms from high blood pressure until it has caused damage to the body. Effective treatment can often prevent, delay or reduce that damage. TREATMENT  When a cause has been identified, treatment for high blood pressure is directed at the cause. There are a large number of medications to treat HTN. These fall into several categories, and your caregiver will help you select the medicines that are best for you. Medications may have side effects. You should review side effects with your caregiver. If your blood pressure stays high after you have made  lifestyle changes or started on medicines,   Your medication(s) may need to be changed.  Other problems may need to be addressed.  Be certain you understand your prescriptions, and know how and when to take your medicine.  Be sure to follow up with your caregiver within the time frame advised (usually within two weeks) to have your blood pressure rechecked and to review your medications.  If you are taking more than one medicine to lower your blood pressure, make sure you know how and at what times they should be taken. Taking two medicines at the same time can result in blood pressure that is too low. SEEK IMMEDIATE MEDICAL CARE IF:  You develop a severe headache, blurred or changing vision, or confusion.  You have unusual weakness or numbness, or a faint feeling.  You have severe chest or abdominal pain, vomiting, or breathing problems. MAKE SURE YOU:   Understand these instructions.  Will watch your condition.  Will get help right away if you are not doing well or get worse. Document Released: 09/22/2005 Document Revised: 12/15/2011 Document Reviewed: 05/12/2008 [facility] Patient Information 2014 [place]. Migraine Headache A migraine headache is an intense, throbbing pain on one or  both sides of your head. A migraine can last for 30 minutes to several hours. CAUSES  The exact cause of a migraine headache is not always known. However, a migraine may be caused when nerves in the brain become irritated and release chemicals that cause inflammation. This causes pain. Certain things may also trigger migraines, such as:  Alcohol.  Smoking.  Stress.  Menstruation.  Aged cheeses.  Foods or drinks that contain nitrates, glutamate, aspartame, or tyramine.  Lack of sleep.  Chocolate.  Caffeine.  Hunger.  Physical exertion.  Fatigue.  Medicines used to treat chest pain (nitroglycerine), birth control pills, estrogen, and some blood pressure medicines. SIGNS AND  SYMPTOMS  Pain on one or both sides of your head.  Pulsating or throbbing pain.  Severe pain that prevents daily activities.  Pain that is aggravated by any physical activity.  Nausea, vomiting, or both.  Dizziness.  Pain with exposure to bright lights, loud noises, or activity.  General sensitivity to bright lights, loud noises, or smells. Before you get a migraine, you may get warning signs that a migraine is coming (aura). An aura may include:  Seeing flashing lights.  Seeing bright spots, halos, or zig-zag lines.  Having tunnel vision or blurred vision.  Having feelings of numbness or tingling.  Having trouble talking.  Having muscle weakness. DIAGNOSIS  A migraine headache is often diagnosed based on:  Symptoms.  Physical exam.  A CT scan or MRI of your head. These imaging tests cannot diagnose migraines, but they can help rule out other causes of headaches. TREATMENT Medicines may be given for pain and nausea. Medicines can also be given to help prevent recurrent migraines.  HOME CARE INSTRUCTIONS  Only take over-the-counter or prescription medicines for pain or discomfort as directed by your health care provider. The use of long-term narcotics is not recommended.  Lie down in a dark, quiet room when you have a migraine.  Keep a journal to find out what may trigger your migraine headaches. For example, write down:  What you eat and drink.  How much sleep you get.  Any change to your diet or medicines.  Limit alcohol consumption.  Quit smoking if you smoke.  Get 7 9 hours of sleep, or as recommended by your health care provider.  Limit stress.  Keep lights dim if bright lights bother you and make your migraines worse. SEEK IMMEDIATE MEDICAL CARE IF:   Your migraine becomes severe.  You have a fever.  You have a stiff neck.  You have vision loss.  You have muscular weakness or loss of muscle control.  You start losing your balance or have  trouble walking.  You feel faint or pass out.  You have severe symptoms that are different from your first symptoms. MAKE SURE YOU:   Understand these instructions.  Will watch your condition.  Will get help right away if you are not doing well or get worse. Document Released: 09/22/2005 Document Revised: 07/13/2013 Document Reviewed: 05/30/2013 [facility] Patient Information 2014 [place].

## 2013-11-20 NOTE — ED Notes (Signed)
Patient with no complaints at this time. Respirations even and unlabored. Skin warm/dry. Discharge instructions reviewed with patient at this time. Patient given opportunity to voice concerns/ask questions. IV removed per policy and band-aid applied to site. Patient discharged at this time and left Emergency Department with steady gait.  

## 2013-12-06 ENCOUNTER — Encounter: Payer: Self-pay | Admitting: Gastroenterology

## 2013-12-27 ENCOUNTER — Ambulatory Visit: Payer: 59 | Admitting: Gastroenterology

## 2013-12-27 ENCOUNTER — Telehealth: Payer: Self-pay | Admitting: Gastroenterology

## 2013-12-27 ENCOUNTER — Encounter: Payer: Self-pay | Admitting: Gastroenterology

## 2013-12-27 NOTE — Telephone Encounter (Signed)
Mailed letter °

## 2013-12-27 NOTE — Telephone Encounter (Signed)
Pt was a no show
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
## 2014-01-27 ENCOUNTER — Ambulatory Visit (HOSPITAL_COMMUNITY)
Admission: RE | Admit: 2014-01-27 | Discharge: 2014-01-27 | Disposition: A | Discharge status: Home / Self Care | Payer: BC Managed Care – PPO | Source: Ambulatory Visit | Attending: Family Medicine | Admitting: Family Medicine

## 2014-01-27 ENCOUNTER — Other Ambulatory Visit (HOSPITAL_COMMUNITY): Payer: Self-pay | Admitting: Family Medicine

## 2014-01-27 DIAGNOSIS — M25569 Pain in unspecified knee: Secondary | ICD-10-CM | POA: Insufficient documentation

## 2014-01-27 DIAGNOSIS — M25562 Pain in left knee: Secondary | ICD-10-CM

## 2014-02-16 ENCOUNTER — Emergency Department (HOSPITAL_COMMUNITY)
Admission: EM | Admit: 2014-02-16 | Discharge: 2014-02-16 | Disposition: A | Discharge status: Home / Self Care | Payer: BC Managed Care – PPO | Attending: Emergency Medicine | Admitting: Emergency Medicine

## 2014-02-16 ENCOUNTER — Encounter (HOSPITAL_COMMUNITY): Payer: Self-pay | Admitting: Emergency Medicine

## 2014-02-16 DIAGNOSIS — Z8669 Personal history of other diseases of the nervous system and sense organs: Secondary | ICD-10-CM | POA: Insufficient documentation

## 2014-02-16 DIAGNOSIS — R42 Dizziness and giddiness: Secondary | ICD-10-CM | POA: Insufficient documentation

## 2014-02-16 DIAGNOSIS — R51 Headache: Secondary | ICD-10-CM | POA: Insufficient documentation

## 2014-02-16 DIAGNOSIS — IMO0002 Reserved for concepts with insufficient information to code with codable children: Secondary | ICD-10-CM | POA: Insufficient documentation

## 2014-02-16 DIAGNOSIS — J45909 Unspecified asthma, uncomplicated: Secondary | ICD-10-CM | POA: Insufficient documentation

## 2014-02-16 DIAGNOSIS — Z8719 Personal history of other diseases of the digestive system: Secondary | ICD-10-CM | POA: Insufficient documentation

## 2014-02-16 DIAGNOSIS — Z3202 Encounter for pregnancy test, result negative: Secondary | ICD-10-CM | POA: Insufficient documentation

## 2014-02-16 DIAGNOSIS — R519 Headache, unspecified: Secondary | ICD-10-CM

## 2014-02-16 DIAGNOSIS — H539 Unspecified visual disturbance: Secondary | ICD-10-CM | POA: Insufficient documentation

## 2014-02-16 DIAGNOSIS — Z7982 Long term (current) use of aspirin: Secondary | ICD-10-CM | POA: Insufficient documentation

## 2014-02-16 DIAGNOSIS — Z79899 Other long term (current) drug therapy: Secondary | ICD-10-CM | POA: Insufficient documentation

## 2014-02-16 DIAGNOSIS — I10 Essential (primary) hypertension: Secondary | ICD-10-CM | POA: Insufficient documentation

## 2014-02-16 LAB — POC URINE PREG, ED: Preg Test, Ur: NEGATIVE

## 2014-02-16 MED ORDER — DIPHENHYDRAMINE HCL 50 MG/ML IJ SOLN
25.0000 mg | Freq: Once | INTRAMUSCULAR | Status: AC
  Administered 2014-02-16: 25 mg via INTRAVENOUS
  Filled 2014-02-16: qty 1

## 2014-02-16 MED ORDER — SODIUM CHLORIDE 0.9 % IV BOLUS (SEPSIS)
1000.0000 mL | INTRAVENOUS | Status: AC
  Administered 2014-02-16: 1000 mL via INTRAVENOUS

## 2014-02-16 MED ORDER — METOCLOPRAMIDE HCL 5 MG/ML IJ SOLN
10.0000 mg | Freq: Once | INTRAMUSCULAR | Status: AC
  Administered 2014-02-16: 10 mg via INTRAVENOUS
  Filled 2014-02-16: qty 2

## 2014-02-16 NOTE — ED Notes (Signed)
Pseudo tumor in head  and feels bad for a while for weeks h/a blurry vision feel like she needs to have her  Head drained. States has been offered a shunt but now she just has LP for tap to get out fluid

## 2014-02-16 NOTE — ED Notes (Signed)
Upon entering patients room the nurse tech noticed patients IV was removed. She inquired about the removal when the patient stated she removed the IV herself.

## 2014-02-16 NOTE — ED Provider Notes (Signed)
CSN: [id number]     Arrival date & time 02/16/14  1209 History   First MD Initiated Contact with Patient 02/16/14 1239     Chief Complaint  Patient presents with  . Headache     (Consider location/radiation/quality/duration/timing/severity/associated sxs/prior Treatment) Patient is a 38 y.o. female presenting with headaches. The history is provided by the patient.  Headache Pain location:  Generalized Quality:  Dull Radiates to:  Does not radiate Severity currently:  10/10 Severity at highest:  10/10 Onset quality:  Gradual Duration:  4 weeks Timing:  Constant Progression:  Waxing and waning Chronicity:  Recurrent Similar to prior headaches: yes   Context comment:  At rest Relieved by:  Nothing Worsened by:  Nothing tried Ineffective treatments:  NSAIDs Associated symptoms: dizziness   Associated symptoms: no abdominal pain, no back pain, no congestion, no cough, no diarrhea, no pain, no fatigue, no fever, no nausea, no neck pain and no vomiting     Past Medical History  Diagnosis Date  . Pseudotumor cerebri   . Asthma   . GERD (gastroesophageal reflux disease)   . Hypertension    Past Surgical History  Procedure Laterality Date  . Cholecystectomy    . Achilles tendon lengthening    . Abdominal hysterectomy    . Lumbar puncture  12/04/2011    dr. Merlene Laughter   Family History  Problem Relation Age of Onset  . Migraines Mother   . Cancer Maternal Uncle   . Cancer Maternal Uncle   . Cancer Paternal Grandmother   . Cancer Paternal Grandfather    History  Substance Use Topics  . Smoking status: Never Smoker   . Smokeless tobacco: Never Used  . Alcohol Use: Yes     Comment: occasionally   OB History   Grav Para Term Preterm Abortions TAB SAB Ect Mult Living   3 3 3       3      Review of Systems  Constitutional: Negative for fever and fatigue.  HENT: Negative for congestion and drooling.   Eyes: Positive for visual disturbance. Negative for pain.   Respiratory: Negative for cough and shortness of breath.   Cardiovascular: Negative for chest pain.  Gastrointestinal: Negative for nausea, vomiting, abdominal pain and diarrhea.  Genitourinary: Negative for dysuria and hematuria.  Musculoskeletal: Negative for back pain, gait problem and neck pain.  Skin: Negative for color change.  Neurological: Positive for dizziness and headaches.  Hematological: Negative for adenopathy.  Psychiatric/Behavioral: Negative for behavioral problems.  All other systems reviewed and are negative.     Allergies  Review of patient's allergies indicates no known allergies.  Home Medications   Prior to Admission medications   Medication Sig Start Date End Date Taking? Authorizing Provider  acetaZOLAMIDE (DIAMOX) 250 MG tablet Take 250 mg by mouth 2 (two) times daily.   Yes Historical Provider, MD  albuterol (PROVENTIL HFA;VENTOLIN HFA) 108 (90 BASE) MCG/ACT inhaler Inhale 2 puffs into the lungs every 6 (six) hours as needed for wheezing or shortness of breath.   Yes Historical Provider, MD  ALPRAZolam Duanne Moron) 1 MG tablet Take 1 mg by mouth 4 (four) times daily as needed for sleep or anxiety. For anxiety   Yes Historical Provider, MD  Aspirin-Salicylamide-Caffeine (BC HEADACHE POWDER PO) Take 2 packets by mouth daily as needed (headache).   Yes Historical Provider, MD  cetirizine (ZYRTEC) 10 MG tablet Take 10 mg by mouth daily.   Yes Historical Provider, MD  fluticasone (FLONASE) 50 MCG/ACT nasal spray  Place 2 sprays into both nostrils daily.   Yes Historical Provider, MD  topiramate (TOPAMAX) 25 MG tablet Take 1 tablet (25 mg total) by mouth 2 (two) times daily. 08/12/13  Yes Neta Ehlers, MD   BP 123/66  Pulse 68  Temp(Src) 98.5 F (36.9 C) (Oral)  Resp 18  Ht 5\' 10"  (1.778 m)  Wt 237 lb (107.502 kg)  BMI 34.01 kg/m2  SpO2 100%  LMP 11/11/2011 Physical Exam  Nursing note and vitals reviewed. Constitutional: She is oriented to person, place,  and time. She appears well-developed and well-nourished.  HENT:  Head: Normocephalic.  Mouth/Throat: Oropharynx is clear and moist. No oropharyngeal exudate.  20/20 vision bilaterally on bedside visual acuity test.   Eyes: Conjunctivae and EOM are normal. Pupils are equal, round, and reactive to light.  Neck: Normal range of motion. Neck supple.  Cardiovascular: Normal rate, regular rhythm, normal heart sounds and intact distal pulses.  Exam reveals no gallop and no friction rub.   No murmur heard. Pulmonary/Chest: Effort normal and breath sounds normal. No respiratory distress. She has no wheezes.  Abdominal: Soft. Bowel sounds are normal. There is no tenderness. There is no rebound and no guarding.  Musculoskeletal: Normal range of motion. She exhibits no edema and no tenderness.  Neurological: She is alert and oriented to person, place, and time.  alert, oriented x3 speech: normal in context and clarity memory: intact grossly cranial nerves II-XII: intact motor strength: full proximally and distally no involuntary movements or tremors sensation: intact to light touch diffusely  cerebellar: finger-to-nose and heel-to-shin intact gait: normal forwards and backwards, normal tandem gait   Skin: Skin is warm and dry.  Psychiatric: She has a normal mood and affect. Her behavior is normal.    ED Course  Procedures (including critical care time) Labs Review Labs Reviewed  POC URINE PREG, ED    Imaging Review No results found.   EKG Interpretation None      MDM   Final diagnoses:  Headache    1:57 PM 38 y.o. female w hx of pseudotumor cerebri who pw daily HA's x 1 mos. She has not f/u w/ her neurologist. She states her HA is global and c/w previous pseudotumor. She also notes blurring vision. Visual acuity is 20/20 on my exam here. She denies fever/vomiting/injury. States she is taking her meds as prescribed. She requests an LP. She has a normal neuro exam. Will tx w/  migraine cocktail. No need for emergent LP. Will schedule outpt fluoro guided LP as pt has hx of CSF leak and requiring blood patches.   3:25 PM: Pt notes HA improved, now 5/10. I arranged therapeutic fluoro guided LP tomorrow w/ radiology. I have discussed the diagnosis/risks/treatment options with the patient and believe the pt to be eligible for discharge home to follow-up with rads tomorrow. We also discussed returning to the ED immediately if new or worsening sx occur. We discussed the sx which are most concerning (e.g., worsening HA, fever) that necessitate immediate return. Medications administered to the patient during their visit and any new prescriptions provided to the patient are listed below.  Medications given during this visit Medications  sodium chloride 0.9 % bolus 1,000 mL (1,000 mLs Intravenous New Bag/Given 02/16/14 1342)  metoCLOPramide (REGLAN) injection 10 mg (10 mg Intravenous Given 02/16/14 1342)  diphenhydrAMINE (BENADRYL) injection 25 mg (25 mg Intravenous Given 02/16/14 1341)    New Prescriptions   No medications on file     Jalaina Salyers S  Aline Brochure, MD 02/16/14 2044

## 2014-02-16 NOTE — ED Notes (Signed)
Family at bedside. 

## 2014-02-16 NOTE — Discharge Instructions (Signed)

## 2014-02-17 ENCOUNTER — Ambulatory Visit (HOSPITAL_COMMUNITY)
Admit: 2014-02-17 | Discharge: 2014-02-17 | Disposition: A | Discharge status: Home / Self Care | Payer: BC Managed Care – PPO | Attending: Emergency Medicine | Admitting: Emergency Medicine

## 2014-02-17 DIAGNOSIS — R51 Headache: Secondary | ICD-10-CM | POA: Insufficient documentation

## 2014-02-17 MED ORDER — ACETAMINOPHEN 500 MG PO TABS
1000.0000 mg | ORAL_TABLET | Freq: Four times a day (QID) | ORAL | Status: DC | PRN
  Administered 2014-02-17: 1000 mg via ORAL
  Filled 2014-02-17 (×2): qty 2

## 2014-02-17 NOTE — Procedures (Signed)
LP performed at L4-5 without difficulty.  Opening pressure 26cm H2O.  25cc clear csf obtained.  No immediate complications.  Full report dictated in [facility].

## 2014-02-17 NOTE — Discharge Instructions (Signed)
Lumbar Puncture Discharge Instructions  1. Go home and rest quietly for the next 24 hours.  It is important to lie flat for the next 24 hours.  Get up only to go to the restroom.  You may lie in the bed or on a couch on your back, your stomach, your left side or your right side.  You may have one pillow under your head.  You may have pillows between your knees while you are on your side or under your knees while you are on your back.  2. DO NOT drive today.  Recline the seat as far back as it will go, while still wearing your seat belt, on the way home.  3. You may get up to go to the bathroom as needed.  You may sit up for 10 minutes to eat.  You may resume your normal diet and medications unless otherwise indicated.  4. The incidence of headache, nausea, or vomiting is about 5% (one in 20 patients).  If you develop a headache, lie flat and drink plenty of fluids until the headache goes away.  Caffeinated beverages may be helpful.  If you develop severe nausea and vomiting or a headache that does not go away with flat bed rest, call office.  5. You may resume normal activities after your 24 hours of bed rest is over; however, do not exert yourself strongly or do any heavy lifting tomorrow.  6. Call your physician for a follow-up appointment.  The results of your myelogram will be sent directly to your physician by the following day.  7. If you have any questions or if complications develop after you arrive home, please call office.  Discharge instructions have been explained to the patient.  The patient, or the person responsible for the patient, fully understands these instructions.

## 2014-02-21 ENCOUNTER — Other Ambulatory Visit (HOSPITAL_COMMUNITY): Payer: Self-pay | Admitting: Family Medicine

## 2014-02-21 DIAGNOSIS — M25562 Pain in left knee: Secondary | ICD-10-CM

## 2014-02-21 DIAGNOSIS — M25469 Effusion, unspecified knee: Secondary | ICD-10-CM

## 2014-02-22 ENCOUNTER — Ambulatory Visit (HOSPITAL_COMMUNITY)
Admission: RE | Admit: 2014-02-22 | Discharge: 2014-02-22 | Disposition: A | Discharge status: Home / Self Care | Payer: BC Managed Care – PPO | Source: Ambulatory Visit | Attending: Family Medicine | Admitting: Family Medicine

## 2014-02-22 DIAGNOSIS — M25569 Pain in unspecified knee: Secondary | ICD-10-CM | POA: Insufficient documentation

## 2014-02-22 DIAGNOSIS — M25469 Effusion, unspecified knee: Secondary | ICD-10-CM

## 2014-02-22 DIAGNOSIS — M25562 Pain in left knee: Secondary | ICD-10-CM

## 2014-02-22 DIAGNOSIS — M7989 Other specified soft tissue disorders: Secondary | ICD-10-CM | POA: Insufficient documentation

## 2014-03-14 ENCOUNTER — Ambulatory Visit (INDEPENDENT_AMBULATORY_CARE_PROVIDER_SITE_OTHER): Payer: BC Managed Care – PPO | Admitting: Orthopedic Surgery

## 2014-03-14 ENCOUNTER — Encounter: Payer: Self-pay | Admitting: Orthopedic Surgery

## 2014-03-14 DIAGNOSIS — M2242 Chondromalacia patellae, left knee: Secondary | ICD-10-CM

## 2014-03-14 DIAGNOSIS — M2240 Chondromalacia patellae, unspecified knee: Secondary | ICD-10-CM

## 2014-03-14 MED ORDER — PREDNISONE (PAK) 10 MG PO TABS: ORAL_TABLET | Freq: Every day | ORAL | Status: DC

## 2014-03-14 MED ORDER — HYDROCODONE-ACETAMINOPHEN 5-325 MG PO TABS: 1.0000 | ORAL_TABLET | Freq: Four times a day (QID) | ORAL | Status: DC | PRN

## 2014-03-14 NOTE — Patient Instructions (Addendum)
Rest for 4 weeks

## 2014-03-14 NOTE — Progress Notes (Signed)
Patient ID: [name], female   DOB: April 09, 1976, 38 y.o.   MRN: [id number]  Chief Complaint  Patient presents with  . Knee Pain    Left knee pain.    HISTORY:  19 rolled female 2 month history of atraumatic onset of anterior left knee pain. She thinks she started having pain after she did some squats during one of her frequent workouts  No major injury Pain swelling catching giving out Burning aching X-rays were done MRI was done both were normal Injection was tried no improvement she also had a Depo-Medrol IM injection with 800 mg of ibuprofen 3 times a day as well as Flexeril and 10 mg of Norco  Pain when going up and down the stairs as well  She also tried to rest try some topical creams presents for evaluation with normal MRI  Medical history is negative surgical history is negative current medications include Dimetapp  No allergies  Diabetes hypertension stroke heart attack blood clots cancer and arthritis make up her family history. Review of systems heartburn bowel habit changes blood clots sinus problems headaches otherwise normal  Vital signs: LMP 11/11/2011   General the patient is well-developed and well-nourished grooming and hygiene are normal Oriented x3 Mood and affect normal Ambulation normal  Inspection of the right knee reveals normal range of motion, stability confirmed by direct test and collateral ligament stress test. Strength normal skin intact  Left knee peripatellar tenderness no joint effusion painful passive range of motion which is normal at 135 knee is stable pain from patellofemoral joint test strength normal skin normal good pulses  Look at her MRI and her x-ray both were negative  Impression chondromalacia  Recommend rest x4 weeks Anti-inflammatory medication Pain medication Physical therapy  Return in a month no surgery indicated

## 2014-03-27 ENCOUNTER — Ambulatory Visit (HOSPITAL_COMMUNITY): Payer: BC Managed Care – PPO | Attending: Orthopedic Surgery | Admitting: Physical Therapy

## 2014-04-05 ENCOUNTER — Encounter (HOSPITAL_COMMUNITY): Payer: Self-pay | Admitting: Emergency Medicine

## 2014-04-05 ENCOUNTER — Emergency Department (HOSPITAL_COMMUNITY): Payer: BC Managed Care – PPO

## 2014-04-05 ENCOUNTER — Emergency Department (HOSPITAL_COMMUNITY)
Admission: EM | Admit: 2014-04-05 | Discharge: 2014-04-05 | Disposition: A | Discharge status: Home / Self Care | Payer: BC Managed Care – PPO | Attending: Emergency Medicine | Admitting: Emergency Medicine

## 2014-04-05 DIAGNOSIS — Z8669 Personal history of other diseases of the nervous system and sense organs: Secondary | ICD-10-CM | POA: Insufficient documentation

## 2014-04-05 DIAGNOSIS — M545 Low back pain, unspecified: Secondary | ICD-10-CM | POA: Insufficient documentation

## 2014-04-05 DIAGNOSIS — Z9089 Acquired absence of other organs: Secondary | ICD-10-CM | POA: Insufficient documentation

## 2014-04-05 DIAGNOSIS — IMO0002 Reserved for concepts with insufficient information to code with codable children: Secondary | ICD-10-CM | POA: Insufficient documentation

## 2014-04-05 DIAGNOSIS — Z79899 Other long term (current) drug therapy: Secondary | ICD-10-CM | POA: Insufficient documentation

## 2014-04-05 DIAGNOSIS — K5900 Constipation, unspecified: Secondary | ICD-10-CM | POA: Insufficient documentation

## 2014-04-05 DIAGNOSIS — I10 Essential (primary) hypertension: Secondary | ICD-10-CM | POA: Insufficient documentation

## 2014-04-05 DIAGNOSIS — Z9889 Other specified postprocedural states: Secondary | ICD-10-CM | POA: Insufficient documentation

## 2014-04-05 DIAGNOSIS — G8929 Other chronic pain: Secondary | ICD-10-CM | POA: Insufficient documentation

## 2014-04-05 DIAGNOSIS — J45909 Unspecified asthma, uncomplicated: Secondary | ICD-10-CM | POA: Insufficient documentation

## 2014-04-05 DIAGNOSIS — Z90710 Acquired absence of both cervix and uterus: Secondary | ICD-10-CM | POA: Insufficient documentation

## 2014-04-05 HISTORY — DX: Other chronic pain: G89.29

## 2014-04-05 HISTORY — DX: Other constipation: K59.09

## 2014-04-05 HISTORY — DX: Unspecified abdominal pain: R10.9

## 2014-04-05 LAB — URINALYSIS, ROUTINE W REFLEX MICROSCOPIC
Bilirubin Urine: NEGATIVE
Glucose, UA: NEGATIVE mg/dL
KETONES UR: NEGATIVE mg/dL
LEUKOCYTES UA: NEGATIVE
NITRITE: NEGATIVE
PH: 5.5 (ref 5.0–8.0)
Protein, ur: NEGATIVE mg/dL
Specific Gravity, Urine: >1.03 — ABNORMAL HIGH (ref 1.005–1.030)
Urobilinogen, UA: 0.2 mg/dL (ref 0.0–1.0)

## 2014-04-05 LAB — URINE MICROSCOPIC-ADD ON

## 2014-04-05 MED ORDER — OXYCODONE-ACETAMINOPHEN 5-325 MG PO TABS
ORAL_TABLET | ORAL | Status: DC
Start: 2014-04-05 — End: 2015-11-18

## 2014-04-05 MED ORDER — METHOCARBAMOL 500 MG PO TABS: 1000.0000 mg | ORAL_TABLET | Freq: Four times a day (QID) | ORAL | Status: DC | PRN

## 2014-04-05 MED ORDER — OXYCODONE-ACETAMINOPHEN 5-325 MG PO TABS
2.0000 | ORAL_TABLET | Freq: Once | ORAL | Status: AC
  Administered 2014-04-05: 2 via ORAL
  Filled 2014-04-05: qty 2

## 2014-04-05 NOTE — ED Provider Notes (Signed)
CSN: [id number]     Arrival date & time 04/05/14  1910 History   First MD Initiated Contact with Patient 04/05/14 2037     Chief Complaint  Patient presents with  . Back Pain  . Constipation  . Illegal value: [    "red stool"      HPI Pt was seen at 2035. Per pt, c/o gradual onset and persistence of constant acute flair of her chronic low back "pain" for the past several days.  Describes the pain as "spasms." Denies any change in her usual chronic pain pattern.  Pain worsens with palpation of the area and body position changes. Denies incont/retention of bowel or bladder, no saddle anesthesia, no focal motor weakness, no tingling/numbness in extremities, no fevers, no injury. Pt also c/o gradual onset and persistence of multiple intermittent episodes of passing "red on my stool" for "a while now." Pt states this occurs only after she strains while having a large, hard BM. Denies rectal pain, no rectal discharge, no N/V/D, no abd pain.     GI: Dr. Gala Romney, Dr. Oneida Alar  Past Medical History  Diagnosis Date  . Pseudotumor cerebri   . Asthma   . GERD (gastroesophageal reflux disease)   . Hypertension   . Chronic back pain   . Chronic constipation   . Chronic abdominal pain    Past Surgical History  Procedure Laterality Date  . Cholecystectomy    . Achilles tendon lengthening    . Abdominal hysterectomy    . Lumbar puncture  12/04/2011    dr. Merlene Laughter   Family History  Problem Relation Age of Onset  . Migraines Mother   . Cancer Maternal Uncle   . Cancer Maternal Uncle   . Cancer Paternal Grandmother   . Cancer Paternal Grandfather    History  Substance Use Topics  . Smoking status: Never Smoker   . Smokeless tobacco: Never Used  . Alcohol Use: Yes     Comment: occasionally   OB History   Grav Para Term Preterm Abortions TAB SAB Ect Mult Living   3 3 3       3      Review of Systems ROS: Statement: All systems negative except as marked or noted in the HPI;  Constitutional: Negative for fever and chills. ; ; Eyes: Negative for eye pain, redness and discharge. ; ; ENMT: Negative for ear pain, hoarseness, nasal congestion, sinus pressure and sore throat. ; ; Cardiovascular: Negative for chest pain, palpitations, diaphoresis, dyspnea and peripheral edema. ; ; Respiratory: Negative for cough, wheezing and stridor. ; ; Gastrointestinal: +constipation, "red stool." Negative for nausea, vomiting, diarrhea, abdominal pain, blood in stool, hematemesis, jaundice and rectal bleeding. . ; ; Genitourinary: Negative for dysuria, flank pain and hematuria. ; ; Musculoskeletal: +LBP. Negative for neck pain. Negative for swelling and trauma.; ; Skin: Negative for pruritus, rash, abrasions, blisters, bruising and skin lesion.; ; Neuro: Negative for headache, lightheadedness and neck stiffness. Negative for weakness, altered level of consciousness , altered mental status, extremity weakness, paresthesias, involuntary movement, seizure and syncope.      Allergies  Review of patient's allergies indicates no known allergies.  Home Medications   Prior to Admission medications   Medication Sig Start Date End Date Taking? Authorizing Provider  acetaZOLAMIDE (DIAMOX) 250 MG tablet Take 250 mg by mouth 2 (two) times daily.   Yes Historical Provider, MD  albuterol (PROVENTIL HFA;VENTOLIN HFA) 108 (90 BASE) MCG/ACT inhaler Inhale 2 puffs into the lungs every 6 (  six) hours as needed for wheezing or shortness of breath.   Yes Historical Provider, MD  ALPRAZolam Duanne Moron) 1 MG tablet Take 1 mg by mouth 4 (four) times daily as needed for sleep or anxiety. For anxiety   Yes Historical Provider, MD  Aspirin-Salicylamide-Caffeine (BC HEADACHE POWDER PO) Take 2 packets by mouth daily as needed (pain).    Yes Historical Provider, MD  cetirizine (ZYRTEC) 10 MG tablet Take 10 mg by mouth daily.   Yes Historical Provider, MD  fluticasone (FLONASE) 50 MCG/ACT nasal spray Place 2 sprays into both  nostrils daily.   Yes Historical Provider, MD  HYDROcodone-acetaminophen (NORCO/VICODIN) 5-325 MG per tablet Take 1 tablet by mouth every 6 (six) hours as needed for moderate pain. 03/14/14  Yes Carole Civil, MD  topiramate (TOPAMAX) 25 MG tablet Take 1 tablet (25 mg total) by mouth 2 (two) times daily. 08/12/13  Yes Neta Ehlers, MD  methocarbamol (ROBAXIN) 500 MG tablet Take 2 tablets (1,000 mg total) by mouth 4 (four) times daily as needed for muscle spasms (muscle spasm/pain). 04/05/14   Alfonzo Feller, DO  oxyCODONE-acetaminophen (PERCOCET/ROXICET) 5-325 MG per tablet 1 or 2 tabs PO q6h prn pain 04/05/14   Alfonzo Feller, DO   BP 148/94  Pulse 85  Temp(Src) 98.8 F (37.1 C) (Oral)  Resp 20  Ht 5\' 10"  (1.778 m)  Wt 230 lb (104.327 kg)  BMI 33.00 kg/m2  SpO2 99%  LMP 11/11/2011 Physical Exam 2040: Physical examination:  Nursing notes reviewed; Vital signs and O2 SAT reviewed;  Constitutional: Well developed, Well nourished, Well hydrated, In no acute distress; Head:  Normocephalic, atraumatic; Eyes: EOMI, PERRL, No scleral icterus; ENMT: Mouth and pharynx normal, Mucous membranes moist; Neck: Supple, Full range of motion, No lymphadenopathy; Cardiovascular: Regular rate and rhythm, No murmur, rub, or gallop; Respiratory: Breath sounds clear & equal bilaterally, No rales, rhonchi, wheezes.  Speaking full sentences with ease, Normal respiratory effort/excursion; Chest: Nontender, Movement normal; Abdomen: Soft, Nontender, Nondistended, Normal bowel sounds. Rectal exam performed w/permission of pt and ED RN chaperone present.  Anal tone normal.  Non-tender, soft brown stool in rectal vault, heme neg.  No fissures, +external hemorrhoid without thrombosis or active bleeding. No palp masses.; Genitourinary: No CVA tenderness; Spine:  No midline CS, TS, LS tenderness. +TTP bilat lumbar paraspinal muscles. No rash.;; Extremities: Pulses normal, No tenderness, No edema, No calf edema or  asymmetry.; Neuro: AA&Ox3, Major CN grossly intact.  Speech clear. Strength 5/5 equal bilat UE's and LE's, including great toe dorsiflexion.  DTR 2/4 equal bilat UE's and LE's.  No gross sensory deficits.  Neg straight leg raises bilat. Climbs on and off stretcher easily by herself. Gait steady..; Skin: Color normal, Warm, Dry.   ED Course  Procedures   MDM  MDM Reviewed: previous chart, nursing note and vitals Reviewed previous: labs, x-ray, CT scan and ultrasound Interpretation: labs and x-ray   Results for orders placed during the hospital encounter of 04/05/14  URINALYSIS, ROUTINE W REFLEX MICROSCOPIC      Result Value Ref Range   Color, Urine YELLOW  YELLOW   APPearance CLEAR  CLEAR   Specific Gravity, Urine >1.030 (*) 1.005 - 1.030   pH 5.5  5.0 - 8.0   Glucose, UA NEGATIVE  NEGATIVE mg/dL   Hgb urine dipstick TRACE (*) NEGATIVE   Bilirubin Urine NEGATIVE  NEGATIVE   Ketones, ur NEGATIVE  NEGATIVE mg/dL   Protein, ur NEGATIVE  NEGATIVE mg/dL   Urobilinogen,  UA 0.2  0.0 - 1.0 mg/dL   Nitrite NEGATIVE  NEGATIVE   Leukocytes, UA NEGATIVE  NEGATIVE  URINE MICROSCOPIC-ADD ON      Result Value Ref Range   Squamous Epithelial / LPF FEW (*) RARE   WBC, UA 0-2  <3 WBC/hpf   RBC / HPF 0-2  <3 RBC/hpf   Bacteria, UA RARE  RARE   Dg Abd Acute W/chest 04/05/2014   CLINICAL DATA:  Rectal bleeding and low back pain.  EXAM: ACUTE ABDOMEN SERIES (ABDOMEN 2 VIEW & CHEST 1 VIEW)  COMPARISON:  Chest x-ray of November 19, 2013 and CT scan of the abdomen pelvis of January 09, 2013.  FINDINGS: The lungs are well-expanded and clear. The heart and mediastinal structures are within the limits of normal. There is no pleural effusion. Within the abdomen there is a moderate stool burden within the right colon. There is no evidence of small or large bowel obstruction. There is mild gaseous distention of the stomach. There surgical clips in the gallbladder fossa. There are numerous phleboliths within the  pelvis. The bony structures are unremarkable.  IMPRESSION: 1. The bowel gas pattern is nonspecific with no evidence of obstruction. A moderate colonic stool burden may reflect clinical constipation. 2. There is no acute cardiopulmonary abnormality. 3. Abdominal pelvic CT scanning with oral and intravenous contrast may be useful.   Electronically Signed   By: David  Martinique   On: 04/05/2014 21:23    2140:  Feels better after pain meds and wants to go home now. Stool is heme negative. Abd remains benign, VSS. Has tol PO well while in the ED without N/V. Neuro exam unchanged. Will tx pain symptomatically at this time. Dx and testing d/w pt and family.  Questions answered.  Verb understanding, agreeable to d/c home with outpt f/u.     Alfonzo Feller, DO 04/08/14 2135

## 2014-04-05 NOTE — ED Notes (Signed)
Pain in lower back, worse with movement per pt. Have take BC powder for it. Feel real bad per pt. Stool has been a bright red in color. Doesn't happen all the time per pt.

## 2014-04-05 NOTE — Discharge Instructions (Signed)
°Emergency Department Resource Guide °1) Find a Doctor and Pay Out of Pocket °Although you won't have to find out who is covered by your insurance plan, it is a good idea to ask around and get recommendations. You will then need to call the office and see if the doctor you have chosen will accept you as a new patient and what types of options they offer for patients who are self-pay. Some doctors offer discounts or will set up payment plans for their patients who do not have insurance, but you will need to ask so you aren't surprised when you get to your appointment. ° °2) Contact Your Local Health Department °Not all health departments have doctors that can see patients for sick visits, but many do, so it is worth a call to see if yours does. If you don't know where your local health department is, you can check in your phone book. The CDC also has a tool to help you locate your state's health department, and many state websites also have listings of all of their local health departments. ° °3) Find a Walk-in Clinic °If your illness is not likely to be very severe or complicated, you may want to try a walk in clinic. These are popping up all over the country in pharmacies, drugstores, and shopping centers. They're usually staffed by nurse practitioners or physician assistants that have been trained to treat common illnesses and complaints. They're usually fairly quick and inexpensive. However, if you have serious medical issues or chronic medical problems, these are probably not your best option. ° °No Primary Care Doctor: °- Call [facility] at  832-8000 - they can help you locate a primary care doctor that  accepts your insurance, provides certain services, etc. °- Physician Referral Service- 1-800-533-3463 ° °Chronic Pain Problems: °Organization         Address  Phone   Notes  °[facility]  (336) 297-2271 Patients need to be referred by their primary care doctor.  ° °Medication  Assistance: °Organization         Address  Phone   Notes  °[place] [facility] [address] °[place], [place] [address] (336) 641-8030 --Must be a resident of [place] °-- Must have NO insurance coverage whatsoever (no Medicaid/ Medicare, etc.) °-- The pt. MUST have a primary care doctor that directs their care regularly and follows them in the community °  °[facility]  (866) 331-1348   °[employer]  (888) 892-1162   ° °Agencies that provide inexpensive medical care: °Organization         Address  Phone   Notes  °[facility]  (336) 832-8035   °[facility]    (336) 832-7272   °[facility] [address] °[place], [place] [address] (336) 832-4777   °[facility] [address], °[place] (336) 271-4999   °[facility]    (336) 373-0678   °[facility]    (336) 272-1050   °[facility] ° [address], [place] Phone:  (336) 832-4444, Fax:  (336) 832-4440 Hours of Operation:  9 am - 6 pm, M-F.  Also accepts Medicaid/Medicare and self-pay.  °[facility] ° [address], [place] Phone: (336) 832-3150, Fax: (336) [phone]. Hours of Operation:  8:30 am - 5:30 pm, M-F.  Also accepts Medicaid and self-pay.  °[facility] [place] [address], [place] Phone: (336) 878-6027   °[facility] [address], [place], [place] (336)723-1848, Ext. 123 Mondays & Thursdays: 7-9 AM.  First 15 patients are seen on a first come, first serve basis. °  ° °Medicaid-accepting [place] Providers: ° °Organization         Address  Phone   Notes  °[facility] [address], [place] (336) 641-2100 Also accepts self-pay patients.  °[facility] [address], [place] ° (336) 856-9996   °[facility] [address], [place]  (336) 288-8857   °[facility] [address] [place] [address], [place] (336) 299-7000   °Veita Bland [address], [place]  ° (336) 373-1557 Only accepts [place] Access Medicaid patients after they have their name applied to their card.  ° °Self-Pay (no insurance) in [place]: ° °Organization         Address  Phone   Notes  °Sickle Cell Patients, [facility] [address], [place] (336) 832-1970   °[facility] [address], [place] (336) 832-4400   °[facility] ° [address], [place] (336) 992-4800   °[facility]/Dr. Osei-Bonsu ° [address] [place] [address], [place] or [address], [place] (336) [phone] Phone number for both [place] and [place] locations is the same.  °[facility] [address], [place] (336) 299-0000   °[facility] [address] [place] [address], [place] or [address] (336) 852-7530 °(336) 878-2260   °[facility] [address], [place] (336) 350-1642, phone; (336) 294-5005, fax Sees patients 1st and 3rd Saturday of every month.  Must not qualify for public or private insurance (i.e. Medicaid, Medicare, [place] Health Choice, Veterans' Benefits) • Household income should be no more than 200% of the poverty level •The clinic cannot treat you if you are pregnant or think you are pregnant • Sexually transmitted diseases are not treated at the clinic.  ° ° °Dental Care: °Organization         Address  Phone  Notes  °[place] [facility] [address], [place] (336) 641-6152 Accepts children up to age 21 who are enrolled in Medicaid or [place] Health Choice; pregnant women with a Medicaid card; and children who have applied for Medicaid or [place] Health Choice, but were declined, whose parents can pay a reduced fee at time of service.  °[place]  [facility] [place]  [address], [place] (336) 641-7733 Accepts children up to age 21 who are enrolled in Medicaid or [place] Health Choice; pregnant women with a Medicaid card; and children who have applied for Medicaid or  Health Choice, but were declined, whose parents can pay a reduced fee at time of service.  °[facility] ° [address], [place] (336) 641-4533 Patients are seen by appointment only. Walk-ins are not accepted. [facility] will see patients 18 years of age and older. °Monday - Tuesday (8am-5pm) °Most Wednesdays (8:30-5pm) °$30 per visit, cash only  °[facility] ° [address], [place] (336) 641-4533 Patients are seen by appointment only. Walk-ins are not accepted. [facility] will see patients 18 years of age and older. °One   Wednesday Evening (Monthly: Volunteer Based).  $30 per visit, cash only  °[facility]  (919) 537-3737 for adults; Children under age 4, call Graduate Pediatric Dentistry at ([phone]) [phone]. Children aged 4-14, please call ([phone]) [phone] to request a pediatric application. ° Dental services are provided in all areas of dental care including fillings, crowns and bridges, complete and partial dentures, implants, gum treatment, root canals, and extractions. Preventive care is also provided. Treatment is provided to both adults and children. °Patients are selected via a lottery and there is often a waiting list. °  °[facility] [address], °[place] ° (336) 763-8833 [email or web address] °  °[facility] [address], [place], [place] (336)723-1848, Ext. 123 Second and Fourth Thursday of each month, opens at 6:30 AM; Clinic ends at 9 AM.  Patients are seen on a first-come first-served basis, and a limited number are seen during each clinic.  ° °[facility] ° [address], [place], [place] (336) 723-7904    Eligibility Requirements °You must have lived in [place], [place], or [place] counties for at least the last three months. °  You cannot be eligible for state or federal sponsored healthcare insurance, including Veterans Administration, Medicaid, or Medicare. °  You generally cannot be eligible for healthcare insurance through your employer.  °  How to apply: °Eligibility screenings are held every Tuesday and Wednesday afternoon from 1:00 pm until 4:00 pm. You do not need an appointment for the interview!  °[facility] [address], [place], [place] 336-631-2330   °[facility]  336-342-8273   °[facility]  336-703-3100   °[facility]  336-570-6415   ° °Behavioral Health Resources in the Community: °Intensive Outpatient Programs °Organization         Address  Phone  Notes  °[facility] [address], [place], [place] 336-878-6098   °[facility] [address], [place], [place] 336-832-9800   °[facility] [address], [place], [place] ° 336-882-2125   °[facility] [address],  °[place], [place] 1-800-853-5163 or 336-641-4981   °Substance Abuse Resources °Organization         Address  Phone  Notes  °[facility]  336-882-2125   °[facility]  336-784-9470   °[facility]  336-285-9073   °[facility]  336-845-3988   °Residential & Outpatient Substance Abuse Program  1-800-659-3381   °Psychological Services °Organization         Address  Phone  Notes  ° Health  336- 832-9600   °[facility]  336- 378-7881   °[facility] [address], [place] 1-800-853-5163 or 336-641-4981   ° °Mobile Crisis Teams °Organization         Address  Phone  Notes  °[facility], Mobile Crisis Care Unit  1-877-626-1772   °Assertive °[facility] ° [address]  [place], [place] 336-834-9664   °Sharon DeEsch [address] °[place] 336-554-5454   ° °Self-Help/Support Groups °Organization         Address  Phone             Notes  °[facility] - variety of support groups  336- 373-1402 Call for more information  °Narcotics Anonymous (NA), [facility] [address], °[place]  2 meetings at this location  ° °  Residential Treatment Programs Organization         Address  Phone  Notes  [facility] [address]    [place]  1-(204)440-6219   [facility]  [address] [place] [address], [place] [address], [place], [place]   [place], [place] [phone] Admissions: 8am-3pm M-F  [facility] [place] [address],    [place], [place] [phone]   [facility] [address] [place], [place], [place]   [facility] [address],  [place], [place]   [facility] Intensive Outpatient [address], [place], [place]   [facility] [address]  [place], [place] 1-602-346-6107 or [phone]   [facility] [address], [place], [place] Accepts Medicaid  [facility] [address],  [place] 1-816-361-6431 Substance Abuse/Addiction Treatment   [facility]         Address  Phone  Notes  [facility]  6846417263   Domenic Schwab, PhD [address] Arlis Porta [place], [place]   947-492-0938 or (701)601-2246   [place], [place] [phone]   [facility] [address] [place] [address], [place], [place] [phone] Insurance/Medicaid/sponsorship through [facility] and Families [address] [place]                                    [place], [place] [phone] [place] [address], [place] [phone]    Dr. Adele Schilder  [phone]   [facility] Dept. 1) [address], [place] 2) [place] 3)  [address], [place] [phone]  [phone]   [place] 581-065-2428 or [phone] (After Hours)      Your stool did not have blood in it today. Take over the counter stool softener (such as miralax or colace), as directed on packaging, for the next month. Try to increase the fiber in your diet.  Take the prescriptions as directed.  Apply moist heat or ice to the area(s) of discomfort, for 15 minutes at a time, several times per day for the next few days.  Do not fall asleep on a heating or ice pack.  Call your regular medical doctor tomorrow to schedule a follow up appointment in the next 2 days. Call the GI doctor tomorrow to schedule a follow up appointment within the next week.   Return to the Emergency Department immediately if worsening.

## 2014-04-06 LAB — POC OCCULT BLOOD, ED: FECAL OCCULT BLD: NEGATIVE

## 2014-04-11 ENCOUNTER — Ambulatory Visit (HOSPITAL_COMMUNITY)
Admission: RE | Admit: 2014-04-11 | Discharge: 2014-04-11 | Disposition: A | Discharge status: Home / Self Care | Payer: BC Managed Care – PPO | Source: Ambulatory Visit | Attending: Orthopedic Surgery | Admitting: Orthopedic Surgery

## 2014-04-11 DIAGNOSIS — R262 Difficulty in walking, not elsewhere classified: Secondary | ICD-10-CM | POA: Insufficient documentation

## 2014-04-11 DIAGNOSIS — M25569 Pain in unspecified knee: Secondary | ICD-10-CM | POA: Insufficient documentation

## 2014-04-11 DIAGNOSIS — M25669 Stiffness of unspecified knee, not elsewhere classified: Secondary | ICD-10-CM | POA: Insufficient documentation

## 2014-04-11 DIAGNOSIS — M25562 Pain in left knee: Secondary | ICD-10-CM

## 2014-04-11 DIAGNOSIS — M25659 Stiffness of unspecified hip, not elsewhere classified: Secondary | ICD-10-CM | POA: Insufficient documentation

## 2014-04-11 DIAGNOSIS — IMO0001 Reserved for inherently not codable concepts without codable children: Secondary | ICD-10-CM | POA: Insufficient documentation
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
## 2014-04-13 ENCOUNTER — Encounter: Payer: Self-pay | Admitting: Orthopedic Surgery

## 2014-04-13 ENCOUNTER — Ambulatory Visit (INDEPENDENT_AMBULATORY_CARE_PROVIDER_SITE_OTHER): Payer: BC Managed Care – PPO | Admitting: Orthopedic Surgery

## 2014-04-13 ENCOUNTER — Ambulatory Visit (HOSPITAL_COMMUNITY)
Admission: RE | Admit: 2014-04-13 | Discharge: 2014-04-13 | Disposition: A | Discharge status: Home / Self Care | Payer: BC Managed Care – PPO | Source: Ambulatory Visit | Attending: Family Medicine | Admitting: Family Medicine

## 2014-04-13 VITALS — BP 135/95 | Ht 70.0 in | Wt 230.0 lb

## 2014-04-13 DIAGNOSIS — M2242 Chondromalacia patellae, left knee: Secondary | ICD-10-CM

## 2014-04-13 DIAGNOSIS — M2240 Chondromalacia patellae, unspecified knee: Secondary | ICD-10-CM

## 2014-04-13 MED ORDER — HYDROCODONE-ACETAMINOPHEN 5-325 MG PO TABS: 1.0000 | ORAL_TABLET | Freq: Four times a day (QID) | ORAL | Status: DC | PRN

## 2014-04-13 NOTE — Progress Notes (Signed)
Physical Therapy Treatment Patient Details  Name: [name] MRN: [id number] Date of Birth: [birth year]  Today's Date: 04/13/2014 Time: 1020-1110 PT Time Calculation (min): 50 min Charge: TE 1020-1100, Ice 1100-1110  Visit#: 2 of 12  Re-eval: 05/11/14    Authorization: BCBS  Authorization Time Period:    Authorization Visit#: 2 of 30   Subjective: Symptoms/Limitations Symptoms: Pt stated complaince with stretches at home, pain scale 8/10 today Pain Assessment Currently in Pain?: Yes Pain Score: 8  Pain Location: Knee Pain Orientation: Left  Objective:   Exercise/Treatments Stretches Active Hamstring Stretch: 3 reps;30 seconds;Limitations Active Hamstring Stretch Limitations: 14 in box 3 directions Piriformis Stretch: Limitations Piriformis Stretch Limitations: 10x 3" 3 directions Gastroc Stretch: 3 reps;30 seconds;Limitations Gastroc Stretch Limitations: slant board Standing Functional Squat: 10 reps;Limitations Functional Squat Limitations: 3D hip excursion Other Standing Knee Exercises: 3D ankle exercison 10x   Manual Therapy Manual Therapy: Joint mobilization Joint Mobilization: Instructed patella mobs all directions; tib/fib Myofascial Release: MFR perimeter of Lt knee all sides with STM to quad, compression on AIIS  Physical Therapy Assessment and Plan PT Assessment and Plan Clinical Impression Statement: Began PT POC to improve hip and ankle mobilty with stretches, 3D excursion exercises and manual technqiues to improve tibial internal rotation with gait.  Pt instructed patella mobs to improve patella tracting to reduce the "popping" during gait.  Ended session with ice for pain control, pt educated on benefits of elevatoin to assist wtih edema.   PT Plan: Next session begin lunges on 8in step with manual faciliation to talus internal rotation as well as tibial IR if needed  Progress hip strengthening to control of the knee in gait,  Next session begin hip  flexor stretch, ITBand and split stance 3D hip excursion.  Give HEP for new activiteis next session.      Goals PT Short Term Goals PT Short Term Goal 1: Patient will display increased Lt ankle dorsiflexion ROM to 20 degrees to increase stride length and increased shock absorbtion at heel strike PT Short Term Goal 1 - Progress: Progressing toward goal PT Short Term Goal 2: Patient will display increased Lt hip internal rotation to 40 degrees to improve shock absorbtion at heel strike of gait PT Short Term Goal 2 - Progress: Progressing toward goal PT Short Term Goal 3: Patient's pain will be <5/10 with walking 55minutes so patient can better tolerate grocery shopping PT Short Term Goal 3 - Progress: Progressing toward goal  Problem List Patient Active Problem List   Diagnosis Date Noted  . Pain in joint, lower leg 04/11/2014  . Stiffness of joint, not elsewhere classified, pelvic region and thigh 04/11/2014  . Difficulty in walking(719.7) 04/11/2014  . 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 Rhabdomyolysis 06/27/2013  . Hypokalemia 06/27/2013  . Bruising 06/27/2013  . ACHILLES TENDON RUPTURE 05/29/2008    PT - End of Session Activity Tolerance: Patient tolerated treatment well General Behavior During Therapy: [facility] for tasks assessed/performed  GP    Aldona Lento 04/13/2014, 11:16 AM

## 2014-04-13 NOTE — Patient Instructions (Signed)
Continue knee exercises, use anti-inflammatories for pain. Control your activities based on things that irritate your knee. No surgery is indicated for your current condition. Please call us if things worsen.

## 2014-04-13 NOTE — Progress Notes (Signed)
Patient ID: [name], female   DOB: 1975-12-09, 38 y.o.   MRN: [id number]  Chief Complaint  Patient presents with  . Follow-up    4 week recheck on left knee after therapy.   BP 135/95  Ht 5\' 10"  (1.778 m)  Wt 230 lb (104.327 kg)  BMI 33.00 kg/m2  LMP 11/11/2011 Encounter Diagnosis  Name Primary?  . Chondromalacia of left patella Yes    Followup chondromalacia left knee she's only gotten 2 treatments of therapy she's been out of work for 4 weeks resting but she has to return to a job that requires 22,000 steps and frequent stair climbing from not optimistic. However after 4 weeks of out of work at think she should give ago. Review of systems clicking popping persists  General appearance is normal, the patient is alert and oriented x3 with normal mood and affect. Ambulatory status normal. Tenderness crepitus and painful patellofemoral compression test with normal range of motion stability and strength in the knee  Unresolved chondromalacia  Continue therapy  Meds ordered this encounter  Medications  . HYDROcodone-acetaminophen (NORCO/VICODIN) 5-325 MG per tablet    Sig: Take 1 tablet by mouth every 6 (six) hours as needed for moderate pain.    Dispense:  56 tablet    Refill:  0

## 2014-04-17 ENCOUNTER — Ambulatory Visit (HOSPITAL_COMMUNITY)
Admission: RE | Admit: 2014-04-17 | Discharge: 2014-04-17 | Disposition: A | Discharge status: Home / Self Care | Payer: BC Managed Care – PPO | Source: Ambulatory Visit | Attending: Family Medicine | Admitting: Family Medicine

## 2014-04-17 NOTE — Progress Notes (Signed)
Physical Therapy Treatment Patient Details  Name: [name] MRN: [id number] Date of Birth: [birth year]  Today's Date: 04/17/2014 Time: 4742-5956 PT Time Calculation (min): 34 min  Visit#: 3 of 12  Re-eval: 05/11/14 Authorization: [employer]  Authorization Visit#: 3 of 30  Charges:  therex 33'  Subjective: Symptoms/Limitations Symptoms: Pt 10' late for appointment.  Pt states she continues to do her HEP and is anxious to get back to working out at the gym.  Currentlly 8/10 pain in her Lt knee, LB is also hurting today. Pain Assessment Currently in Pain?: Yes Pain Score: 8  Pain Location: Knee Pain Orientation: Left   Exercise/Treatments Stretches Active Hamstring Stretch: 3 reps;30 seconds;Limitations Active Hamstring Stretch Limitations: 14 in box Hip Flexor Stretch: 3 reps;Limitations Hip Flexor Stretch Limitations: 10 reps on 8" box Piriformis Stretch: Limitations Piriformis Stretch Limitations: 10x 3" 3 directions Gastroc Stretch: 3 reps;30 seconds;Limitations Gastroc Stretch Limitations: slant board Standing Functional Squat: 10 reps;Limitations Functional Squat Limitations: 3D hip excursion split stance    Physical Therapy Assessment and Plan PT Assessment:  Added forward lunges, squats in split stance and hip flexor stretch to POC.  Pt required therapist facilitation for correct form and posture with activities.  Pt had no c/o pain throughout session, only knee "giving way" with Lt LE forward split stance squats.  PT Plan: Progress hip strengthening to increase control of the knee in gait,  Next session begin ITB stretch.       Problem List Patient Active Problem List   Diagnosis Date Noted  . Pain in joint, lower leg 04/11/2014  . Stiffness of joint, not elsewhere classified, pelvic region and thigh 04/11/2014  . Difficulty in walking(719.7) 04/11/2014  . 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 Rhabdomyolysis 06/27/2013  . Hypokalemia 06/27/2013  .  Bruising 06/27/2013  . ACHILLES TENDON RUPTURE 05/29/2008    PT - End of Session Activity Tolerance: Patient tolerated treatment well General Behavior During Therapy: [facility] for tasks assessed/performed  GP    Teena Irani, PTA/CLT 04/17/2014, 10:14 AM

## 2014-04-19 ENCOUNTER — Ambulatory Visit (HOSPITAL_COMMUNITY): Payer: BC Managed Care – PPO

## 2014-04-20 ENCOUNTER — Ambulatory Visit (HOSPITAL_COMMUNITY): Payer: BC Managed Care – PPO

## 2014-04-24 ENCOUNTER — Ambulatory Visit (HOSPITAL_COMMUNITY): Payer: BC Managed Care – PPO

## 2014-04-26 ENCOUNTER — Ambulatory Visit (HOSPITAL_COMMUNITY): Payer: BC Managed Care – PPO | Admitting: Physical Therapy

## 2014-04-27 ENCOUNTER — Ambulatory Visit (HOSPITAL_COMMUNITY): Payer: BC Managed Care – PPO

## 2014-05-01 ENCOUNTER — Ambulatory Visit (HOSPITAL_COMMUNITY)
Admission: RE | Admit: 2014-05-01 | Payer: BC Managed Care – PPO | Source: Ambulatory Visit | Attending: Orthopedic Surgery | Admitting: Orthopedic Surgery

## 2014-05-03 ENCOUNTER — Ambulatory Visit (HOSPITAL_COMMUNITY): Payer: BC Managed Care – PPO | Admitting: Physical Therapy

## 2014-05-04 ENCOUNTER — Ambulatory Visit (HOSPITAL_COMMUNITY): Payer: BC Managed Care – PPO

## 2014-05-08 ENCOUNTER — Ambulatory Visit (HOSPITAL_COMMUNITY): Payer: BC Managed Care – PPO

## 2014-05-10 ENCOUNTER — Ambulatory Visit (HOSPITAL_COMMUNITY): Payer: BC Managed Care – PPO

## 2014-05-11 ENCOUNTER — Encounter: Payer: Self-pay | Admitting: Orthopedic Surgery

## 2014-05-11 ENCOUNTER — Ambulatory Visit: Payer: BC Managed Care – PPO | Admitting: Orthopedic Surgery

## 2014-08-07 ENCOUNTER — Encounter (HOSPITAL_COMMUNITY): Payer: Self-pay | Admitting: Emergency Medicine

## 2014-10-15 ENCOUNTER — Emergency Department (HOSPITAL_COMMUNITY)
Admission: EM | Admit: 2014-10-15 | Discharge: 2014-10-15 | Disposition: A | Discharge status: Home / Self Care | Payer: BLUE CROSS/BLUE SHIELD | Attending: Emergency Medicine | Admitting: Emergency Medicine

## 2014-10-15 ENCOUNTER — Encounter (HOSPITAL_COMMUNITY): Payer: Self-pay | Admitting: Emergency Medicine

## 2014-10-15 ENCOUNTER — Emergency Department (HOSPITAL_COMMUNITY): Payer: BLUE CROSS/BLUE SHIELD

## 2014-10-15 DIAGNOSIS — G8929 Other chronic pain: Secondary | ICD-10-CM | POA: Insufficient documentation

## 2014-10-15 DIAGNOSIS — J45909 Unspecified asthma, uncomplicated: Secondary | ICD-10-CM | POA: Diagnosis not present

## 2014-10-15 DIAGNOSIS — Z7952 Long term (current) use of systemic steroids: Secondary | ICD-10-CM | POA: Diagnosis not present

## 2014-10-15 DIAGNOSIS — J40 Bronchitis, not specified as acute or chronic: Secondary | ICD-10-CM

## 2014-10-15 DIAGNOSIS — J209 Acute bronchitis, unspecified: Secondary | ICD-10-CM | POA: Diagnosis not present

## 2014-10-15 DIAGNOSIS — R0602 Shortness of breath: Secondary | ICD-10-CM | POA: Diagnosis present

## 2014-10-15 DIAGNOSIS — Z8719 Personal history of other diseases of the digestive system: Secondary | ICD-10-CM | POA: Diagnosis not present

## 2014-10-15 DIAGNOSIS — I10 Essential (primary) hypertension: Secondary | ICD-10-CM | POA: Insufficient documentation

## 2014-10-15 LAB — COMPREHENSIVE METABOLIC PANEL
ALBUMIN: 4 g/dL (ref 3.5–5.2)
ALT: 26 U/L (ref 0–35)
ANION GAP: 4 — AB (ref 5–15)
AST: 24 U/L (ref 0–37)
Alkaline Phosphatase: 55 U/L (ref 39–117)
BUN: 8 mg/dL (ref 6–23)
CO2: 28 mmol/L (ref 19–32)
CREATININE: 0.67 mg/dL (ref 0.50–1.10)
Calcium: 8.7 mg/dL (ref 8.4–10.5)
Chloride: 104 mEq/L (ref 96–112)
Glucose, Bld: 87 mg/dL (ref 70–99)
Potassium: 4.1 mmol/L (ref 3.5–5.1)
Sodium: 136 mmol/L (ref 135–145)
Total Bilirubin: 0.4 mg/dL (ref 0.3–1.2)
Total Protein: 7.5 g/dL (ref 6.0–8.3)

## 2014-10-15 LAB — CBC WITH DIFFERENTIAL/PLATELET
Basophils Absolute: 0 10*3/uL (ref 0.0–0.1)
Basophils Relative: 0 % (ref 0–1)
Eosinophils Absolute: 0.4 10*3/uL (ref 0.0–0.7)
Eosinophils Relative: 3 % (ref 0–5)
HEMATOCRIT: 38.6 % (ref 36.0–46.0)
Hemoglobin: 12.8 g/dL (ref 12.0–15.0)
Lymphocytes Relative: 12 % (ref 12–46)
Lymphs Abs: 1.6 10*3/uL (ref 0.7–4.0)
MCH: 29.4 pg (ref 26.0–34.0)
MCHC: 33.2 g/dL (ref 30.0–36.0)
MCV: 88.5 fL (ref 78.0–100.0)
MONO ABS: 1.2 10*3/uL — AB (ref 0.1–1.0)
Monocytes Relative: 9 % (ref 3–12)
NEUTROS ABS: 9.8 10*3/uL — AB (ref 1.7–7.7)
NEUTROS PCT: 76 % (ref 43–77)
Platelets: 360 10*3/uL (ref 150–400)
RBC: 4.36 MIL/uL (ref 3.87–5.11)
RDW: 13.8 % (ref 11.5–15.5)
WBC: 13.1 10*3/uL — ABNORMAL HIGH (ref 4.0–10.5)

## 2014-10-15 MED ORDER — LEVOFLOXACIN 500 MG PO TABS: 500.0000 mg | ORAL_TABLET | Freq: Every day | ORAL | Status: DC

## 2014-10-15 MED ORDER — ALBUTEROL SULFATE (2.5 MG/3ML) 0.083% IN NEBU
5.0000 mg | INHALATION_SOLUTION | Freq: Once | RESPIRATORY_TRACT | Status: AC
  Administered 2014-10-15: 5 mg via RESPIRATORY_TRACT
  Filled 2014-10-15: qty 6

## 2014-10-15 MED ORDER — OXYCODONE-ACETAMINOPHEN 5-325 MG PO TABS
2.0000 | ORAL_TABLET | Freq: Once | ORAL | Status: AC
  Administered 2014-10-15: 2 via ORAL
  Filled 2014-10-15: qty 2

## 2014-10-15 MED ORDER — HYDROCODONE-ACETAMINOPHEN 5-325 MG PO TABS: 1.0000 | ORAL_TABLET | Freq: Four times a day (QID) | ORAL | Status: DC | PRN

## 2014-10-15 NOTE — ED Notes (Signed)
Respiratory paged to admin breathing tx

## 2014-10-15 NOTE — Discharge Instructions (Signed)
Follow up with your md in 2-3 days if not improving.   Plenty of fluids

## 2014-10-15 NOTE — ED Provider Notes (Signed)
CSN: [id number]     Arrival date & time 10/15/14  1322 History   First MD Initiated Contact with Patient 10/15/14 1536     Chief Complaint  Patient presents with  . Shortness of Breath     (Consider location/radiation/quality/duration/timing/severity/associated sxs/prior Treatment) Patient is a 39 y.o. female presenting with shortness of breath. The history is provided by the patient (the pt complains of cough and aches.  txed with tamiflu and zpak no relief).  Shortness of Breath Severity:  Moderate Onset quality:  Gradual Timing:  Constant Progression:  Waxing and waning Chronicity:  New Context: not activity   Associated symptoms: no abdominal pain, no chest pain, no cough, no headaches and no rash     Past Medical History  Diagnosis Date  . Pseudotumor cerebri   . Asthma   . GERD (gastroesophageal reflux disease)   . Hypertension   . Chronic back pain   . Chronic constipation   . Chronic abdominal pain    Past Surgical History  Procedure Laterality Date  . Cholecystectomy    . Achilles tendon lengthening    . Abdominal hysterectomy    . Lumbar puncture  12/04/2011    dr. Merlene Laughter   Family History  Problem Relation Age of Onset  . Migraines Mother   . Cancer Maternal Uncle   . Cancer Maternal Uncle   . Cancer Paternal Grandmother   . Cancer Paternal Grandfather    History  Substance Use Topics  . Smoking status: Never Smoker   . Smokeless tobacco: Never Used  . Alcohol Use: Yes     Comment: occasionally   OB History    Gravida Para Term Preterm AB TAB SAB Ectopic Multiple Living   3 3 3       3      Review of Systems  Constitutional: Negative for appetite change and fatigue.  HENT: Negative for congestion, ear discharge and sinus pressure.   Eyes: Negative for discharge.  Respiratory: Positive for shortness of breath. Negative for cough.   Cardiovascular: Negative for chest pain.  Gastrointestinal: Negative for abdominal pain and diarrhea.   Genitourinary: Negative for frequency and hematuria.  Musculoskeletal: Negative for back pain.  Skin: Negative for rash.  Neurological: Negative for seizures and headaches.  Psychiatric/Behavioral: Negative for hallucinations.      Allergies  Review of patient's allergies indicates no known allergies.  Home Medications   Prior to Admission medications   Medication Sig Start Date End Date Taking? Authorizing Provider  albuterol (PROVENTIL HFA;VENTOLIN HFA) 108 (90 BASE) MCG/ACT inhaler Inhale 2 puffs into the lungs every 6 (six) hours as needed for wheezing or shortness of breath.   Yes Historical Provider, MD  ALPRAZolam Duanne Moron) 1 MG tablet Take 1 mg by mouth 4 (four) times daily as needed for sleep or anxiety. For anxiety   Yes Historical Provider, MD  Aspirin-Salicylamide-Caffeine (BC HEADACHE POWDER PO) Take 1-2 packets by mouth daily as needed (pain).    Yes Historical Provider, MD  cyclobenzaprine (FLEXERIL) 10 MG tablet Take 10 mg by mouth daily as needed. For spasms/pain 08/28/14  Yes Historical Provider, MD  guaiFENesin (MUCINEX) 600 MG 12 hr tablet Take 1,200 mg by mouth 2 (two) times daily as needed for cough or to loosen phlegm.   Yes Historical Provider, MD  ibuprofen (ADVIL,MOTRIN) 800 MG tablet Take 800 mg by mouth every 8 (eight) hours as needed for mild pain or moderate pain.   Yes Historical Provider, MD  methocarbamol (ROBAXIN) 500 MG  tablet Take 2 tablets (1,000 mg total) by mouth 4 (four) times daily as needed for muscle spasms (muscle spasm/pain). 04/05/14  Yes Francine Graven, DO  azithromycin (ZITHROMAX) 250 MG tablet Take 250-500 mg by mouth See admin instructions. 500mg  on day 1 then take 250mg  on days 2 through 5. Course COMPLETED (Started on 10/09/2014) 10/09/14   Historical Provider, MD  cetirizine (ZYRTEC) 10 MG tablet Take 10 mg by mouth daily.    Historical Provider, MD  fluticasone (FLONASE) 50 MCG/ACT nasal spray Place 2 sprays into both nostrils daily.     Historical Provider, MD  HYDROcodone-acetaminophen (NORCO/VICODIN) 5-325 MG per tablet Take 1 tablet by mouth every 6 (six) hours as needed for moderate pain. Patient not taking: Reported on 10/15/2014 04/13/14   Carole Civil, MD  HYDROcodone-acetaminophen (NORCO/VICODIN) 5-325 MG per tablet Take 1 tablet by mouth every 6 (six) hours as needed. 10/15/14   Maudry Diego, MD  HYDROMET 5-1.5 MG/5ML syrup Take 5 mLs by mouth every 6 (six) hours as needed. For cough 10/09/14   Historical Provider, MD  levofloxacin (LEVAQUIN) 500 MG tablet Take 1 tablet (500 mg total) by mouth daily. 10/15/14   Maudry Diego, MD  oxyCODONE-acetaminophen (PERCOCET/ROXICET) 5-325 MG per tablet 1 or 2 tabs PO q6h prn pain Patient not taking: Reported on 10/15/2014 04/05/14   Francine Graven, DO  TAMIFLU 75 MG capsule Take 75 mg by mouth 2 (two) times daily. 5 day course COMPLETED 10/09/14   Historical Provider, MD  topiramate (TOPAMAX) 25 MG tablet Take 1 tablet (25 mg total) by mouth 2 (two) times daily. Patient not taking: Reported on 10/15/2014 08/12/13   Ernestina Patches, MD   BP 138/90 mmHg  Pulse 92  Temp(Src) 98.9 F (37.2 C) (Oral)  Resp 23  Ht 5\' 10"  (1.778 m)  Wt 209 lb (94.802 kg)  BMI 29.99 kg/m2  SpO2 96%  LMP 11/11/2011 Physical Exam  Constitutional: She is oriented to person, place, and time. She appears well-developed.  HENT:  Head: Normocephalic.  Eyes: Conjunctivae and EOM are normal. No scleral icterus.  Neck: Neck supple. No thyromegaly present.  Cardiovascular: Normal rate and regular rhythm.  Exam reveals no gallop and no friction rub.   No murmur heard. Pulmonary/Chest: No stridor. She has no wheezes. She has no rales. She exhibits no tenderness.  Abdominal: She exhibits no distension. There is no tenderness. There is no rebound.  Musculoskeletal: Normal range of motion. She exhibits no edema.  Lymphadenopathy:    She has no cervical adenopathy.  Neurological: She is oriented to person,  place, and time. She exhibits normal muscle tone. Coordination normal.  Skin: No rash noted. No erythema.  Psychiatric: She has a normal mood and affect. Her behavior is normal.    ED Course  Procedures (including critical care time) Labs Review Labs Reviewed  CBC WITH DIFFERENTIAL - Abnormal; Notable for the following:    WBC 13.1 (*)    Neutro Abs 9.8 (*)    Monocytes Absolute 1.2 (*)    All other components within normal limits  COMPREHENSIVE METABOLIC PANEL - Abnormal; Notable for the following:    Anion gap 4 (*)    All other components within normal limits    Imaging Review Dg Chest 2 View  10/15/2014   CLINICAL DATA:  Shortness of breath, cough, fever, body aches. Initial encounter.  EXAM: CHEST  2 VIEW  COMPARISON:  04/05/2014  FINDINGS: The heart size and mediastinal contours are within normal limits.  Both lungs are clear. The visualized skeletal structures are unremarkable.  IMPRESSION: No active cardiopulmonary disease.   Electronically Signed   By: Kathreen Devoid   On: 10/15/2014 15:03     EKG Interpretation None      MDM   Final diagnoses:  Bronchitis    Bronchitis,  Not improving.   Will add amox and vicodin and follow up     Maudry Diego, MD 10/15/14 [phone]

## 2014-10-15 NOTE — ED Notes (Signed)
Patient c/o shortness of breath. Per patient seen PCP on Monday and diagnosed with flu. Per patient given Tamiflu and antibiotics but reports feeling worse. Patient reports nonproductive cough despite using Mucinex. Patient also reports using inhaler with no relief. Chest sore with cough and generalized aches.

## 2014-12-16 ENCOUNTER — Encounter (HOSPITAL_COMMUNITY): Payer: Self-pay | Admitting: Emergency Medicine

## 2014-12-16 ENCOUNTER — Emergency Department (INDEPENDENT_AMBULATORY_CARE_PROVIDER_SITE_OTHER)
Admission: EM | Admit: 2014-12-16 | Discharge: 2014-12-16 | Disposition: A | Discharge status: Home / Self Care | Payer: BLUE CROSS/BLUE SHIELD | Source: Home / Self Care | Attending: Family Medicine | Admitting: Family Medicine

## 2014-12-16 ENCOUNTER — Emergency Department (INDEPENDENT_AMBULATORY_CARE_PROVIDER_SITE_OTHER): Payer: BLUE CROSS/BLUE SHIELD

## 2014-12-16 DIAGNOSIS — J069 Acute upper respiratory infection, unspecified: Secondary | ICD-10-CM | POA: Diagnosis not present

## 2014-12-16 MED ORDER — BENZONATATE 100 MG PO CAPS: 100.0000 mg | ORAL_CAPSULE | Freq: Three times a day (TID) | ORAL | Status: DC | PRN

## 2014-12-16 MED ORDER — IPRATROPIUM BROMIDE 0.06 % NA SOLN: 2.0000 | Freq: Four times a day (QID) | NASAL | Status: DC

## 2014-12-16 NOTE — ED Provider Notes (Signed)
CSN: [id number]     Arrival date & time 12/16/14  9678 History   First MD Initiated Contact with Patient 12/16/14 1050     Chief Complaint  Patient presents with  . URI   (Consider location/radiation/quality/duration/timing/severity/associated sxs/prior Treatment) HPI Comments: Nonsmoker Works at [employer] PCP: [place]  Patient is a 39 y.o. female presenting with URI. The history is provided by the patient.  URI Presenting symptoms: congestion, cough, rhinorrhea and sore throat   Presenting symptoms: no ear pain, no facial pain, no fatigue and no fever   Severity:  Mild Onset quality:  Gradual Duration:  4 days Timing:  Constant Progression:  Unchanged Chronicity:  New Associated symptoms: sneezing   Associated symptoms: no wheezing     Past Medical History  Diagnosis Date  . Pseudotumor cerebri   . Asthma   . GERD (gastroesophageal reflux disease)   . Hypertension   . Chronic back pain   . Chronic constipation   . Chronic abdominal pain    Past Surgical History  Procedure Laterality Date  . Cholecystectomy    . Achilles tendon lengthening    . Abdominal hysterectomy    . Lumbar puncture  12/04/2011    dr. Merlene Laughter   Family History  Problem Relation Age of Onset  . Migraines Mother   . Cancer Maternal Uncle   . Cancer Maternal Uncle   . Cancer Paternal Grandmother   . Cancer Paternal Grandfather    History  Substance Use Topics  . Smoking status: Never Smoker   . Smokeless tobacco: Never Used  . Alcohol Use: Yes     Comment: occasionally   OB History    Gravida Para Term Preterm AB TAB SAB Ectopic Multiple Living   3 3 3       3      Review of Systems  Constitutional: Negative for fever and fatigue.  HENT: Positive for congestion, rhinorrhea, sneezing, sore throat and voice change. Negative for ear pain.   Eyes: Negative.   Respiratory: Positive for cough. Negative for chest tightness, shortness of breath and wheezing.   Cardiovascular: Negative.    Gastrointestinal: Negative.   Genitourinary: Negative.   Musculoskeletal: Negative for back pain.  Skin: Negative.     Allergies  Review of patient's allergies indicates no known allergies.  Home Medications   Prior to Admission medications   Medication Sig Start Date End Date Taking? Authorizing Provider  albuterol (PROVENTIL HFA;VENTOLIN HFA) 108 (90 BASE) MCG/ACT inhaler Inhale 2 puffs into the lungs every 6 (six) hours as needed for wheezing or shortness of breath.    Historical Provider, MD  ALPRAZolam Duanne Moron) 1 MG tablet Take 1 mg by mouth 4 (four) times daily as needed for sleep or anxiety. For anxiety    Historical Provider, MD  Aspirin-Salicylamide-Caffeine (BC HEADACHE POWDER PO) Take 1-2 packets by mouth daily as needed (pain).     Historical Provider, MD  azithromycin (ZITHROMAX) 250 MG tablet Take 250-500 mg by mouth See admin instructions. 500mg  on day 1 then take 250mg  on days 2 through 5. Course COMPLETED (Started on 10/09/2014) 10/09/14   Historical Provider, MD  benzonatate (TESSALON) 100 MG capsule Take 1 capsule (100 mg total) by mouth 3 (three) times daily as needed for cough. 12/16/14   Audelia Hives Zulema Pulaski, PA  cetirizine (ZYRTEC) 10 MG tablet Take 10 mg by mouth daily.    Historical Provider, MD  cyclobenzaprine (FLEXERIL) 10 MG tablet Take 10 mg by mouth daily as needed. For  spasms/pain 08/28/14   Historical Provider, MD  fluticasone (FLONASE) 50 MCG/ACT nasal spray Place 2 sprays into both nostrils daily.    Historical Provider, MD  guaiFENesin (MUCINEX) 600 MG 12 hr tablet Take 1,200 mg by mouth 2 (two) times daily as needed for cough or to loosen phlegm.    Historical Provider, MD  HYDROcodone-acetaminophen (NORCO/VICODIN) 5-325 MG per tablet Take 1 tablet by mouth every 6 (six) hours as needed for moderate pain. Patient not taking: Reported on 10/15/2014 04/13/14   Carole Civil, MD  HYDROcodone-acetaminophen (NORCO/VICODIN) 5-325 MG per tablet Take 1 tablet by  mouth every 6 (six) hours as needed. 10/15/14   Milton Ferguson, MD  HYDROMET 5-1.5 MG/5ML syrup Take 5 mLs by mouth every 6 (six) hours as needed. For cough 10/09/14   Historical Provider, MD  ibuprofen (ADVIL,MOTRIN) 800 MG tablet Take 800 mg by mouth every 8 (eight) hours as needed for mild pain or moderate pain.    Historical Provider, MD  ipratropium (ATROVENT) 0.06 % nasal spray Place 2 sprays into both nostrils 4 (four) times daily. For nasal congestion 12/16/14   Audelia Hives Yeira Gulden, PA  levofloxacin (LEVAQUIN) 500 MG tablet Take 1 tablet (500 mg total) by mouth daily. 10/15/14   Milton Ferguson, MD  methocarbamol (ROBAXIN) 500 MG tablet Take 2 tablets (1,000 mg total) by mouth 4 (four) times daily as needed for muscle spasms (muscle spasm/pain). 04/05/14   Francine Graven, DO  oxyCODONE-acetaminophen (PERCOCET/ROXICET) 5-325 MG per tablet 1 or 2 tabs PO q6h prn pain Patient not taking: Reported on 10/15/2014 04/05/14   Francine Graven, DO  TAMIFLU 75 MG capsule Take 75 mg by mouth 2 (two) times daily. 5 day course COMPLETED 10/09/14   Historical Provider, MD  topiramate (TOPAMAX) 25 MG tablet Take 1 tablet (25 mg total) by mouth 2 (two) times daily. Patient not taking: Reported on 10/15/2014 08/12/13   Ernestina Patches, MD   BP 157/89 mmHg  Pulse 74  Temp(Src) 98.5 F (36.9 C) (Oral)  Resp 16  SpO2 97%  LMP 11/11/2011 Physical Exam  Constitutional: She is oriented to person, place, and time. She appears well-developed and well-nourished.  HENT:  Head: Normocephalic and atraumatic.  Right Ear: Hearing, tympanic membrane, external ear and ear canal normal.  Left Ear: Hearing, tympanic membrane, external ear and ear canal normal.  Nose: Nose normal.  Mouth/Throat: Uvula is midline, oropharynx is clear and moist and mucous membranes are normal.  Eyes: Conjunctivae are normal. No scleral icterus.  Neck: Normal range of motion. Neck supple.  Cardiovascular: Normal rate, regular rhythm and normal  heart sounds.   Pulmonary/Chest: Effort normal and breath sounds normal.  Abdominal: Soft. Bowel sounds are normal. She exhibits no distension. There is no tenderness.  Musculoskeletal: Normal range of motion.  Lymphadenopathy:    She has no cervical adenopathy.  Neurological: She is alert and oriented to person, place, and time.  Skin: Skin is warm and dry.  Psychiatric: She has a normal mood and affect. Her behavior is normal.  Nursing note and vitals reviewed.   ED Course  Procedures (including critical care time) Labs Review Labs Reviewed - No data to display  Imaging Review Dg Chest 2 View  12/16/2014   CLINICAL DATA:  Cough  EXAM: CHEST  2 VIEW  COMPARISON:  10/15/2014  FINDINGS: The heart size and mediastinal contours are within normal limits. Both lungs are clear. The visualized skeletal structures are unremarkable.  IMPRESSION: No active cardiopulmonary disease.  Electronically Signed   By: Kerby Moors M.D.   On: 12/16/2014 12:09     MDM   1. URI (upper respiratory infection)   Vital signs normal with exception of mildly elevated blood pressure Afebrile No hypoxia Exam benign CXR unremarkable Patient advised of symptomatic care of viral URI at home and advised PCP follow up if no improvement  Tessalon and atrovent as prescribed   Lutricia Feil, PA 12/16/14 1225

## 2014-12-16 NOTE — ED Notes (Signed)
C/o   Productive cough with thick yellow sputum.   Chest congestion.  On set Tuesday.  Pt is currently taking mucinex with no relief.  Denies fever, n/v /d.  States just recently got over the flu almost a month ago.  Has hx of recurrent bronchitis.

## 2014-12-16 NOTE — Discharge Instructions (Signed)
Your chest xray was normal and your exam and vital signs do not indicate the need for treatment with antibiotics. Please use medications as prescribed for your symptoms and should your symptoms worsen, please follow up with your primary care doctor for re-evaluation. You can expect your cough to last for at least 14 days. You should also be aware that your blood pressure is elevated and this should be re-evaluated by your doctor as soon as possible.  Upper Respiratory Infection, Adult An upper respiratory infection (URI) is also sometimes known as the common cold. The upper respiratory tract includes the nose, sinuses, throat, trachea, and bronchi. Bronchi are the airways leading to the lungs. Most people improve within 1 week, but symptoms can last up to 2 weeks. A residual cough may last even longer.  CAUSES Many different viruses can infect the tissues lining the upper respiratory tract. The tissues become irritated and inflamed and often become very moist. Mucus production is also common. A cold is contagious. You can easily spread the virus to others by oral contact. This includes kissing, sharing a glass, coughing, or sneezing. Touching your mouth or nose and then touching a surface, which is then touched by another person, can also spread the virus. SYMPTOMS  Symptoms typically develop 1 to 3 days after you come in contact with a cold virus. Symptoms vary from person to person. They may include:  Runny nose.  Sneezing.  Nasal congestion.  Sinus irritation.  Sore throat.  Loss of voice (laryngitis).  Cough.  Fatigue.  Muscle aches.  Loss of appetite.  Headache.  Low-grade fever. DIAGNOSIS  You might diagnose your own cold based on familiar symptoms, since most people get a cold 2 to 3 times a year. Your caregiver can confirm this based on your exam. Most importantly, your caregiver can check that your symptoms are not due to another disease such as strep throat, sinusitis,  pneumonia, asthma, or epiglottitis. Blood tests, throat tests, and X-rays are not necessary to diagnose a common cold, but they may sometimes be helpful in excluding other more serious diseases. Your caregiver will decide if any further tests are required. RISKS AND COMPLICATIONS  You may be at risk for a more severe case of the common cold if you smoke cigarettes, have chronic heart disease (such as heart failure) or lung disease (such as asthma), or if you have a weakened immune system. The very young and very old are also at risk for more serious infections. Bacterial sinusitis, middle ear infections, and bacterial pneumonia can complicate the common cold. The common cold can worsen asthma and chronic obstructive pulmonary disease (COPD). Sometimes, these complications can require emergency medical care and may be life-threatening. PREVENTION  The best way to protect against getting a cold is to practice good hygiene. Avoid oral or hand contact with people with cold symptoms. Wash your hands often if contact occurs. There is no clear evidence that vitamin C, vitamin E, echinacea, or exercise reduces the chance of developing a cold. However, it is always recommended to get plenty of rest and practice good nutrition. TREATMENT  Treatment is directed at relieving symptoms. There is no cure. Antibiotics are not effective, because the infection is caused by a virus, not by bacteria. Treatment may include:  Increased fluid intake. Sports drinks offer valuable electrolytes, sugars, and fluids.  Breathing heated mist or steam (vaporizer or shower).  Eating chicken soup or other clear broths, and maintaining good nutrition.  Getting plenty of rest.  Using gargles or lozenges for comfort.  Controlling fevers with ibuprofen or acetaminophen as directed by your caregiver.  Increasing usage of your inhaler if you have asthma. Zinc gel and zinc lozenges, taken in the first 24 hours of the common cold, can  shorten the duration and lessen the severity of symptoms. Pain medicines may help with fever, muscle aches, and throat pain. A variety of non-prescription medicines are available to treat congestion and runny nose. Your caregiver can make recommendations and may suggest nasal or lung inhalers for other symptoms.  HOME CARE INSTRUCTIONS   Only take over-the-counter or prescription medicines for pain, discomfort, or fever as directed by your caregiver.  Use a warm mist humidifier or inhale steam from a shower to increase air moisture. This may keep secretions moist and make it easier to breathe.  Drink enough water and fluids to keep your urine clear or pale yellow.  Rest as needed.  Return to work when your temperature has returned to normal or as your caregiver advises. You may need to stay home longer to avoid infecting others. You can also use a face mask and careful hand washing to prevent spread of the virus. SEEK MEDICAL CARE IF:   After the first few days, you feel you are getting worse rather than better.  You need your caregiver's advice about medicines to control symptoms.  You develop chills, worsening shortness of breath, or brown or red sputum. These may be signs of pneumonia.  You develop yellow or brown nasal discharge or pain in the face, especially when you bend forward. These may be signs of sinusitis.  You develop a fever, swollen neck glands, pain with swallowing, or white areas in the back of your throat. These may be signs of strep throat. SEEK IMMEDIATE MEDICAL CARE IF:   You have a fever.  You develop severe or persistent headache, ear pain, sinus pain, or chest pain.  You develop wheezing, a prolonged cough, cough up blood, or have a change in your usual mucus (if you have chronic lung disease).  You develop sore muscles or a stiff neck. Document Released: 03/18/2001 Document Revised: 12/15/2011 Document Reviewed: 12/28/2013 [facility] Patient Information 2015  [place], [place]. This information is not intended to replace advice given to you by your health care provider. Make sure you discuss any questions you have with your health care provider.

## 2015-01-30 ENCOUNTER — Emergency Department (HOSPITAL_COMMUNITY)
Admission: EM | Admit: 2015-01-30 | Discharge: 2015-01-30 | Disposition: A | Discharge status: Home / Self Care | Payer: BLUE CROSS/BLUE SHIELD | Attending: Emergency Medicine | Admitting: Emergency Medicine

## 2015-01-30 ENCOUNTER — Encounter (HOSPITAL_COMMUNITY): Payer: Self-pay

## 2015-01-30 DIAGNOSIS — J45909 Unspecified asthma, uncomplicated: Secondary | ICD-10-CM | POA: Insufficient documentation

## 2015-01-30 DIAGNOSIS — Z7951 Long term (current) use of inhaled steroids: Secondary | ICD-10-CM | POA: Insufficient documentation

## 2015-01-30 DIAGNOSIS — Y998 Other external cause status: Secondary | ICD-10-CM | POA: Diagnosis not present

## 2015-01-30 DIAGNOSIS — Y9389 Activity, other specified: Secondary | ICD-10-CM | POA: Insufficient documentation

## 2015-01-30 DIAGNOSIS — I10 Essential (primary) hypertension: Secondary | ICD-10-CM | POA: Insufficient documentation

## 2015-01-30 DIAGNOSIS — Z8719 Personal history of other diseases of the digestive system: Secondary | ICD-10-CM | POA: Diagnosis not present

## 2015-01-30 DIAGNOSIS — Z792 Long term (current) use of antibiotics: Secondary | ICD-10-CM | POA: Diagnosis not present

## 2015-01-30 DIAGNOSIS — G8929 Other chronic pain: Secondary | ICD-10-CM | POA: Insufficient documentation

## 2015-01-30 DIAGNOSIS — S161XXA Strain of muscle, fascia and tendon at neck level, initial encounter: Secondary | ICD-10-CM | POA: Insufficient documentation

## 2015-01-30 DIAGNOSIS — Y92410 Unspecified street and highway as the place of occurrence of the external cause: Secondary | ICD-10-CM | POA: Insufficient documentation

## 2015-01-30 DIAGNOSIS — S0990XA Unspecified injury of head, initial encounter: Secondary | ICD-10-CM | POA: Insufficient documentation

## 2015-01-30 DIAGNOSIS — Z79899 Other long term (current) drug therapy: Secondary | ICD-10-CM | POA: Diagnosis not present

## 2015-01-30 DIAGNOSIS — S199XXA Unspecified injury of neck, initial encounter: Secondary | ICD-10-CM | POA: Diagnosis present

## 2015-01-30 MED ORDER — NAPROXEN 500 MG PO TABS
500.0000 mg | ORAL_TABLET | Freq: Once | ORAL | Status: AC
  Administered 2015-01-30: 500 mg via ORAL
  Filled 2015-01-30: qty 1

## 2015-01-30 MED ORDER — DIAZEPAM 5 MG PO TABS
5.0000 mg | ORAL_TABLET | Freq: Once | ORAL | Status: AC
  Administered 2015-01-30: 5 mg via ORAL
  Filled 2015-01-30: qty 1

## 2015-01-30 MED ORDER — NAPROXEN 500 MG PO TABS: 500.0000 mg | ORAL_TABLET | Freq: Two times a day (BID) | ORAL | Status: DC

## 2015-01-30 MED ORDER — DIAZEPAM 5 MG PO TABS: 5.0000 mg | ORAL_TABLET | Freq: Two times a day (BID) | ORAL | Status: DC | PRN
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
## 2015-04-30 ENCOUNTER — Emergency Department (HOSPITAL_COMMUNITY)
Admission: EM | Admit: 2015-04-30 | Discharge: 2015-04-30 | Disposition: A | Discharge status: Home / Self Care | Payer: BLUE CROSS/BLUE SHIELD | Attending: Emergency Medicine | Admitting: Emergency Medicine

## 2015-04-30 ENCOUNTER — Emergency Department (HOSPITAL_COMMUNITY): Payer: BLUE CROSS/BLUE SHIELD

## 2015-04-30 ENCOUNTER — Encounter (HOSPITAL_COMMUNITY): Payer: Self-pay | Admitting: Emergency Medicine

## 2015-04-30 DIAGNOSIS — Z7982 Long term (current) use of aspirin: Secondary | ICD-10-CM | POA: Diagnosis not present

## 2015-04-30 DIAGNOSIS — K219 Gastro-esophageal reflux disease without esophagitis: Secondary | ICD-10-CM | POA: Insufficient documentation

## 2015-04-30 DIAGNOSIS — R51 Headache: Secondary | ICD-10-CM | POA: Insufficient documentation

## 2015-04-30 DIAGNOSIS — R519 Headache, unspecified: Secondary | ICD-10-CM

## 2015-04-30 DIAGNOSIS — R600 Localized edema: Secondary | ICD-10-CM | POA: Diagnosis present

## 2015-04-30 DIAGNOSIS — Z79899 Other long term (current) drug therapy: Secondary | ICD-10-CM | POA: Insufficient documentation

## 2015-04-30 DIAGNOSIS — Z7951 Long term (current) use of inhaled steroids: Secondary | ICD-10-CM | POA: Insufficient documentation

## 2015-04-30 DIAGNOSIS — I10 Essential (primary) hypertension: Secondary | ICD-10-CM | POA: Diagnosis not present

## 2015-04-30 DIAGNOSIS — J45909 Unspecified asthma, uncomplicated: Secondary | ICD-10-CM | POA: Insufficient documentation

## 2015-04-30 DIAGNOSIS — G8929 Other chronic pain: Secondary | ICD-10-CM | POA: Insufficient documentation

## 2015-04-30 DIAGNOSIS — R220 Localized swelling, mass and lump, head: Secondary | ICD-10-CM | POA: Diagnosis not present

## 2015-04-30 DIAGNOSIS — H538 Other visual disturbances: Secondary | ICD-10-CM | POA: Insufficient documentation

## 2015-04-30 LAB — PROTIME-INR
INR: 1.13 (ref 0.00–1.49)
Prothrombin Time: 14.7 seconds (ref 11.6–15.2)

## 2015-04-30 LAB — COMPREHENSIVE METABOLIC PANEL
ALK PHOS: 41 U/L (ref 38–126)
ALT: 15 U/L (ref 14–54)
ANION GAP: 4 — AB (ref 5–15)
AST: 22 U/L (ref 15–41)
Albumin: 3.6 g/dL (ref 3.5–5.0)
BUN: 11 mg/dL (ref 6–20)
CALCIUM: 8.6 mg/dL — AB (ref 8.9–10.3)
CHLORIDE: 111 mmol/L (ref 101–111)
CO2: 22 mmol/L (ref 22–32)
CREATININE: 0.78 mg/dL (ref 0.44–1.00)
GFR calc non Af Amer: >60 mL/min (ref >60)
GLUCOSE: 81 mg/dL (ref 65–99)
Potassium: 3.5 mmol/L (ref 3.5–5.1)
SODIUM: 137 mmol/L (ref 135–145)
Total Bilirubin: 0.5 mg/dL (ref 0.3–1.2)
Total Protein: 7 g/dL (ref 6.5–8.1)

## 2015-04-30 LAB — CBC
HEMATOCRIT: 37.2 % (ref 36.0–46.0)
Hemoglobin: 12.6 g/dL (ref 12.0–15.0)
MCH: 29.5 pg (ref 26.0–34.0)
MCHC: 33.9 g/dL (ref 30.0–36.0)
MCV: 87.1 fL (ref 78.0–100.0)
Platelets: 335 10*3/uL (ref 150–400)
RBC: 4.27 MIL/uL (ref 3.87–5.11)
RDW: 14.6 % (ref 11.5–15.5)
WBC: 6.5 10*3/uL (ref 4.0–10.5)

## 2015-04-30 LAB — APTT: APTT: 27 s (ref 24–37)

## 2015-04-30 LAB — DIFFERENTIAL
BASOS ABS: 0 10*3/uL (ref 0.0–0.1)
Basophils Relative: 1 % (ref 0–1)
EOS ABS: 0.4 10*3/uL (ref 0.0–0.7)
Eosinophils Relative: 6 % — ABNORMAL HIGH (ref 0–5)
Lymphocytes Relative: 37 % (ref 12–46)
Lymphs Abs: 2.4 10*3/uL (ref 0.7–4.0)
Monocytes Absolute: 0.6 10*3/uL (ref 0.1–1.0)
Monocytes Relative: 9 % (ref 3–12)
Neutro Abs: 3.1 10*3/uL (ref 1.7–7.7)
Neutrophils Relative %: 47 % (ref 43–77)

## 2015-04-30 LAB — I-STAT CHEM 8, ED
BUN: 13 mg/dL (ref 6–20)
CREATININE: 0.8 mg/dL (ref 0.44–1.00)
Calcium, Ion: 1.25 mmol/L — ABNORMAL HIGH (ref 1.12–1.23)
Chloride: 109 mmol/L (ref 101–111)
GLUCOSE: 77 mg/dL (ref 65–99)
HEMATOCRIT: 40 % (ref 36.0–46.0)
Hemoglobin: 13.6 g/dL (ref 12.0–15.0)
Potassium: 3.6 mmol/L (ref 3.5–5.1)
Sodium: 144 mmol/L (ref 135–145)
TCO2: 19 mmol/L (ref 0–100)

## 2015-04-30 LAB — ETHANOL

## 2015-04-30 LAB — I-STAT TROPONIN, ED: TROPONIN I, POC: 0 ng/mL (ref 0.00–0.08)

## 2015-04-30 NOTE — ED Notes (Signed)
Patient here with complaint of right facial swelling and change in right eye vision. States onset @ 0300. Reports history of pseudotumor. No gross deficits. Right side of face obviously swollen compared to left.

## 2015-04-30 NOTE — Consult Note (Signed)
NEURO HOSPITALIST CONSULT NOTE   Referring physician: ED Reason for Consult: right face swelling, decreased vision right face  HPI:                                                                                                                                          [name] is an 39 y.o. female with a past medical history that is significant for HTN, pseudotumor cerebri, asthma, GERD, chronic back pain, chronic abdominal pain, presents to the ED for evaluation of that above stated symptoms. Patient indicated that she went to bed feeling well last night but woke up around 3 am today and when she looked to the mirror noticed swelling of the right face and she couldn't see out of the right eye. She decided to apply ice to the right face and the swelling did improve slightly. She said that she was also having a HA but no associated nausea, vomiting, double vision, focal weakness, slurred speech, confusion, or language impairment. No shortness of breath or difficulty swallowing. Denies fever, rash, recent dental work, face trauma, sinus or dental infection. No new medications. Patient expressed that she hasn't been taking her diamox and Topamax in a long time but takes daily Excedrin. Overall, she denies a change in her HA pattern and " my blurred vision is about the same". CT brain was personally reviewed and showed no acute abnormality. Serologies are unremarkable.   Past Medical History  Diagnosis Date  . Pseudotumor cerebri   . Asthma   . GERD (gastroesophageal reflux disease)   . Hypertension   . Chronic back pain   . Chronic constipation   . Chronic abdominal pain     Past Surgical History  Procedure Laterality Date  . Cholecystectomy    . Achilles tendon lengthening    . Abdominal hysterectomy    . Lumbar puncture  12/04/2011    dr. Merlene Laughter    Family History  Problem Relation Age of Onset  . Migraines Mother   . Cancer Maternal Uncle   . Cancer  Maternal Uncle   . Cancer Paternal Grandmother   . Cancer Paternal Grandfather     Family History: no brain tumor, epilepsy, MS   Social History:  reports that she has never smoked. She has never used smokeless tobacco. She reports that she drinks alcohol. She reports that she uses illicit drugs (Marijuana).  No Known Allergies  MEDICATIONS:  I have reviewed the patient's current medications.   ROS:                                                                                                                                       History obtained from chart review and the patient  General ROS: negative for - chills, fatigue, fever, night sweats, or weight loss Psychological ROS: negative for - behavioral disorder, hallucinations, memory difficulties, mood swings or suicidal ideation Ophthalmic ROS: negative for -  double vision or eye pain  ENT ROS: negative for - epistaxis, nasal discharge, oral lesions, sore throat, tinnitus or vertigo Allergy and Immunology ROS: negative for - hives or itchy/watery eyes Hematological and Lymphatic ROS: negative for - bleeding problems, bruising or swollen lymph nodes Endocrine ROS: negative for - galactorrhea, hair pattern changes, polydipsia/polyuria or temperature intolerance Respiratory ROS: negative for - cough, hemoptysis, shortness of breath or wheezing Cardiovascular ROS: negative for - chest pain, dyspnea on exertion, edema or irregular heartbeat Gastrointestinal ROS: negative for - abdominal pain, diarrhea, hematemesis, nausea/vomiting or stool incontinence Genito-Urinary ROS: negative for - dysuria, hematuria, incontinence or urinary frequency/urgency Musculoskeletal ROS: negative for - joint swelling or muscular weakness Neurological ROS: as noted in HPI Dermatological ROS: negative for rash and skin lesion  changes  Physical exam: pleasant female in no apparent distress. Blood pressure 146/80, pulse 71, temperature 98.3 F (36.8 C), temperature source Oral, resp. rate 21, height $RemoveBe'5\' 10"'KlNvZGrim$  (1.778 m), weight 104.282 kg (229 lb 14.4 oz), last menstrual period 11/11/2011, SpO2 100 %. Head: normocephalic. Eyes:Squinting with right eye but no proptosis, chemosis, or periorbital swelling  Face: no rash, tenderness, or frank edema Neck: supple, no bruits, no JVD. Cardiac: no murmurs. Lungs: clear. Abdomen: soft, no tender, no mass. Extremities: no edema. Skin: no rash  Neurologic Examination:                                                                                                      General: Mental Status: Alert, oriented, thought content appropriate.  Speech fluent without evidence of aphasia.  Able to follow 3 step commands without difficulty. Cranial Nerves: II: Discs flat bilaterally; Visual fields grossly normal, pupils equal, round, reactive to light and accommodation III,IV, VI: ptosis not present, extra-ocular motions intact bilaterally V,VII: smile symmetric, facial light touch sensation normal bilaterally VIII: hearing normal bilaterally IX,X: uvula rises symmetrically XI: bilateral shoulder shrug XII: midline tongue extension without atrophy or fasciculations  Motor: Right : Upper extremity  5/5    Left:     Upper extremity   5/5  Lower extremity   5/5     Lower extremity   5/5 Tone and bulk:normal tone throughout; no atrophy noted Sensory: Pinprick and light touch intact throughout, bilaterally Deep Tendon Reflexes:  Right: Upper Extremity   Left: Upper extremity   biceps (C-5 to C-6) 2/4   biceps (C-5 to C-6) 2/4 tricep (C7) 2/4    triceps (C7) 2/4 Brachioradialis (C6) 2/4  Brachioradialis (C6) 2/4  Lower Extremity Lower Extremity  quadriceps (L-2 to L-4) 2/4   quadriceps (L-2 to L-4) 2/4 Achilles (S1) 2/4   Achilles (S1) 2/4  Plantars: Right: downgoing   Left:  downgoing Cerebellar: normal finger-to-nose,  normal heel-to-shin test Gait:  No tested due to multiple leads    No results found for: CHOL  Results for orders placed or performed during the hospital encounter of 04/30/15 (from the past 48 hour(s))  Ethanol     Status: None   Collection Time: 04/30/15  6:10 AM  Result Value Ref Range   Alcohol, Ethyl (B) <5 <5 mg/dL    Comment:        LOWEST DETECTABLE LIMIT FOR SERUM ALCOHOL IS 5 mg/dL FOR MEDICAL PURPOSES ONLY   Protime-INR     Status: None   Collection Time: 04/30/15  6:10 AM  Result Value Ref Range   Prothrombin Time 14.7 11.6 - 15.2 seconds   INR 1.13 0.00 - 1.49  APTT     Status: None   Collection Time: 04/30/15  6:10 AM  Result Value Ref Range   aPTT 27 24 - 37 seconds  CBC     Status: None   Collection Time: 04/30/15  6:10 AM  Result Value Ref Range   WBC 6.5 4.0 - 10.5 K/uL   RBC 4.27 3.87 - 5.11 MIL/uL   Hemoglobin 12.6 12.0 - 15.0 g/dL   HCT 37.2 36.0 - 46.0 %   MCV 87.1 78.0 - 100.0 fL   MCH 29.5 26.0 - 34.0 pg   MCHC 33.9 30.0 - 36.0 g/dL   RDW 14.6 11.5 - 15.5 %   Platelets 335 150 - 400 K/uL  Differential     Status: Abnormal   Collection Time: 04/30/15  6:10 AM  Result Value Ref Range   Neutrophils Relative % 47 43 - 77 %   Neutro Abs 3.1 1.7 - 7.7 K/uL   Lymphocytes Relative 37 12 - 46 %   Lymphs Abs 2.4 0.7 - 4.0 K/uL   Monocytes Relative 9 3 - 12 %   Monocytes Absolute 0.6 0.1 - 1.0 K/uL   Eosinophils Relative 6 (H) 0 - 5 %   Eosinophils Absolute 0.4 0.0 - 0.7 K/uL   Basophils Relative 1 0 - 1 %   Basophils Absolute 0.0 0.0 - 0.1 K/uL  Comprehensive metabolic panel     Status: Abnormal   Collection Time: 04/30/15  6:10 AM  Result Value Ref Range   Sodium 137 135 - 145 mmol/L   Potassium 3.5 3.5 - 5.1 mmol/L   Chloride 111 101 - 111 mmol/L   CO2 22 22 - 32 mmol/L   Glucose, Bld 81 65 - 99 mg/dL   BUN 11 6 - 20 mg/dL   Creatinine, Ser 0.78 0.44 - 1.00 mg/dL   Calcium 8.6 (L) 8.9 - 10.3  mg/dL   Total Protein 7.0 6.5 - 8.1 g/dL   Albumin 3.6 3.5 - 5.0 g/dL   AST  22 15 - 41 U/L   ALT 15 14 - 54 U/L   Alkaline Phosphatase 41 38 - 126 U/L   Total Bilirubin 0.5 0.3 - 1.2 mg/dL   GFR calc non Af Amer >60 >60 mL/min   GFR calc Af Amer >60 >60 mL/min    Comment: (NOTE) The eGFR has been calculated using the CKD EPI equation. This calculation has not been validated in all clinical situations. eGFR's persistently <60 mL/min signify possible Chronic Kidney Disease.    Anion gap 4 (L) 5 - 15  I-stat troponin, ED (not at [facility], [facility])     Status: None   Collection Time: 04/30/15  6:20 AM  Result Value Ref Range   Troponin i, poc 0.00 0.00 - 0.08 ng/mL   Comment 3            Comment: Due to the release kinetics of cTnI, a negative result within the first hours of the onset of symptoms does not rule out myocardial infarction with certainty. If myocardial infarction is still suspected, repeat the test at appropriate intervals.   I-Stat Chem 8, ED  (not at [facility], [facility])     Status: Abnormal   Collection Time: 04/30/15  6:23 AM  Result Value Ref Range   Sodium 144 135 - 145 mmol/L   Potassium 3.6 3.5 - 5.1 mmol/L   Chloride 109 101 - 111 mmol/L   BUN 13 6 - 20 mg/dL   Creatinine, Ser 0.80 0.44 - 1.00 mg/dL   Glucose, Bld 77 65 - 99 mg/dL   Calcium, Ion 1.25 (H) 1.12 - 1.23 mmol/L   TCO2 19 0 - 100 mmol/L   Hemoglobin 13.6 12.0 - 15.0 g/dL   HCT 40.0 36.0 - 46.0 %    Ct Head Wo Contrast  04/30/2015   CLINICAL DATA:  Right-sided facial numbness and swelling  EXAM: CT HEAD WITHOUT CONTRAST  TECHNIQUE: Contiguous axial images were obtained from the base of the skull through the vertex without intravenous contrast.  COMPARISON:  11/19/2013  FINDINGS: The bony calvarium is intact. The ventricles are of normal size and configuration. No findings to suggest acute hemorrhage, acute infarction or space-occupying mass lesion are noted.  IMPRESSION: No acute intracranial abnormality noted.    Electronically Signed   By: Inez Catalina M.D.   On: 04/30/2015 07:13   Assessment/Plan: 39 y/o with known pseudotumor cerebri, presents with complains of right face swelling and decreased vision right eye. Her neuro-exam is unremarkable and there is not evidence of right proptosis, chemosis, or periorbital swelling. No fever, rash, or recent face trauma, dental surgery/infection, or sinusitis, thus unlikely cavernous sinus thrombosis. CT brain and serologies unremarkable. Perplexing presentation but quite frankly I am not inclined to believe this is related to her underlying pseudotumor. Advised outpatient follow up.  Dorian Pod, MD 04/30/2015, 8:34 AM  [facility]

## 2015-04-30 NOTE — ED Notes (Signed)
MD at bedside. 

## 2015-04-30 NOTE — ED Provider Notes (Signed)
CSN: [id number]     Arrival date & time 04/30/15  0542 History   First MD Initiated Contact with Patient 04/30/15 [phone]     Chief Complaint  Patient presents with  . Facial Swelling  . Decreased Visual Acuity     (Consider location/radiation/quality/duration/timing/severity/associated sxs/prior Treatment) The history is provided by the patient and medical records.   This is a 39 year old female with history of asthma, GERD, hypertension, chronic back pain, chronic abdominal pain, pseudotumor cerebri, presenting to the ED for right facial swelling and vision changes in right eye.  Patient states over the past several days she has been having severe headaches and a "pins and needles" sensation to the left side of her face extending down the side of her neck.  Denies focal numbness or weakness.  No dizziness, syncope, confusion, changes in speech, tinnitus, or ataxia.  Patient states headaches are the same as her pseuodotumor headaches.  Patient states at 0300 this morning she began having the right-sided facial swelling, visual change in right eye. She states it is difficult to fully open her right eye, has improved since waking so.  No pain/pin-and-needles on right side of face.  She denies any trauma or foreign body exposure to right eye. She denies full vision loss of right eye.  Patient states last therapeutic LP was a year ago or more.  She does not have an OP neurologist at this time.  No hx of TIA or stroke.  No fever, chills.  No rashes or lesions.  Patient is not currently on anti-coagulation.  VSS.  Past Medical History  Diagnosis Date  . Pseudotumor cerebri   . Asthma   . GERD (gastroesophageal reflux disease)   . Hypertension   . Chronic back pain   . Chronic constipation   . Chronic abdominal pain    Past Surgical History  Procedure Laterality Date  . Cholecystectomy    . Achilles tendon lengthening    . Abdominal hysterectomy    . Lumbar puncture  12/04/2011    dr. Merlene Laughter    Family History  Problem Relation Age of Onset  . Migraines Mother   . Cancer Maternal Uncle   . Cancer Maternal Uncle   . Cancer Paternal Grandmother   . Cancer Paternal Grandfather    History  Substance Use Topics  . Smoking status: Never Smoker   . Smokeless tobacco: Never Used  . Alcohol Use: Yes     Comment: occasionally   OB History    Gravida Para Term Preterm AB TAB SAB Ectopic Multiple Living   3 3 3       3      Review of Systems  Eyes: Positive for visual disturbance.  Neurological: Positive for headaches.  All other systems reviewed and are negative.     Allergies  Review of patient's allergies indicates no known allergies.  Home Medications   Prior to Admission medications   Medication Sig Start Date End Date Taking? Authorizing Provider  albuterol (PROVENTIL HFA;VENTOLIN HFA) 108 (90 BASE) MCG/ACT inhaler Inhale 2 puffs into the lungs every 6 (six) hours as needed for wheezing or shortness of breath.    Historical Provider, MD  ALPRAZolam Duanne Moron) 1 MG tablet Take 1 mg by mouth 4 (four) times daily as needed for sleep or anxiety. For anxiety    Historical Provider, MD  Aspirin-Salicylamide-Caffeine (BC HEADACHE POWDER PO) Take 1-2 packets by mouth daily as needed (pain).     Historical Provider, MD  azithromycin (ZITHROMAX) 250  MG tablet Take 250-500 mg by mouth See admin instructions. 500mg  on day 1 then take 250mg  on days 2 through 5. Course COMPLETED (Started on 10/09/2014) 10/09/14   Historical Provider, MD  benzonatate (TESSALON) 100 MG capsule Take 1 capsule (100 mg total) by mouth 3 (three) times daily as needed for cough. 12/16/14   Audelia Hives Presson, PA  cetirizine (ZYRTEC) 10 MG tablet Take 10 mg by mouth daily.    Historical Provider, MD  cyclobenzaprine (FLEXERIL) 10 MG tablet Take 10 mg by mouth daily as needed. For spasms/pain 08/28/14   Historical Provider, MD  diazepam (VALIUM) 5 MG tablet Take 1 tablet (5 mg total) by mouth every 12 (twelve)  hours as needed for muscle spasms. 01/30/15   Robyn M Hess, PA-C  fluticasone (FLONASE) 50 MCG/ACT nasal spray Place 2 sprays into both nostrils daily.    Historical Provider, MD  guaiFENesin (MUCINEX) 600 MG 12 hr tablet Take 1,200 mg by mouth 2 (two) times daily as needed for cough or to loosen phlegm.    Historical Provider, MD  HYDROcodone-acetaminophen (NORCO/VICODIN) 5-325 MG per tablet Take 1 tablet by mouth every 6 (six) hours as needed for moderate pain. Patient not taking: Reported on 10/15/2014 04/13/14   Carole Civil, MD  HYDROcodone-acetaminophen (NORCO/VICODIN) 5-325 MG per tablet Take 1 tablet by mouth every 6 (six) hours as needed. 10/15/14   Milton Ferguson, MD  HYDROMET 5-1.5 MG/5ML syrup Take 5 mLs by mouth every 6 (six) hours as needed. For cough 10/09/14   Historical Provider, MD  ibuprofen (ADVIL,MOTRIN) 800 MG tablet Take 800 mg by mouth every 8 (eight) hours as needed for mild pain or moderate pain.    Historical Provider, MD  ipratropium (ATROVENT) 0.06 % nasal spray Place 2 sprays into both nostrils 4 (four) times daily. For nasal congestion 12/16/14   Audelia Hives Presson, PA  levofloxacin (LEVAQUIN) 500 MG tablet Take 1 tablet (500 mg total) by mouth daily. 10/15/14   Milton Ferguson, MD  methocarbamol (ROBAXIN) 500 MG tablet Take 2 tablets (1,000 mg total) by mouth 4 (four) times daily as needed for muscle spasms (muscle spasm/pain). 04/05/14   Francine Graven, DO  naproxen (NAPROSYN) 500 MG tablet Take 1 tablet (500 mg total) by mouth 2 (two) times daily. 01/30/15   Carman Ching, PA-C  oxyCODONE-acetaminophen (PERCOCET/ROXICET) 5-325 MG per tablet 1 or 2 tabs PO q6h prn pain Patient not taking: Reported on 10/15/2014 04/05/14   Francine Graven, DO  TAMIFLU 75 MG capsule Take 75 mg by mouth 2 (two) times daily. 5 day course COMPLETED 10/09/14   Historical Provider, MD  topiramate (TOPAMAX) 25 MG tablet Take 1 tablet (25 mg total) by mouth 2 (two) times daily. Patient not taking:  Reported on 10/15/2014 08/12/13   Ernestina Patches, MD   BP 154/94 mmHg  Pulse 71  Temp(Src) 98.3 F (36.8 C) (Oral)  Resp 19  Ht 5\' 10"  (1.778 m)  Wt 229 lb 14.4 oz (104.282 kg)  BMI 32.99 kg/m2  SpO2 100%  LMP 11/11/2011   Physical Exam  Constitutional: She is oriented to person, place, and time. She appears well-developed and well-nourished.  HENT:  Head: Normocephalic and atraumatic.  Mouth/Throat: Oropharynx is clear and moist.  No significant right sided facial swelling noted; no lip or tongue swelling; handling secretions well; no difficulty swallowing  Eyes: Conjunctivae and EOM are normal. Pupils are equal, round, and reactive to light.  Squinting with right eye; EOMs intact; PERRL  Neck: Normal range of motion.  Cardiovascular: Normal rate, regular rhythm and normal heart sounds.   Pulmonary/Chest: Effort normal and breath sounds normal. No respiratory distress. She has no wheezes.  Abdominal: Soft. Bowel sounds are normal.  Musculoskeletal: Normal range of motion.  Neurological: She is alert and oriented to person, place, and time. She displays no tremor. She displays no seizure activity.  AAOx3, answering questions and following commands appropriately; equal strength UE and LE bilaterally; CN grossly intact; moves all extremities appropriately without ataxia; symmetric forehead wrinkle, no droop noted  Skin: Skin is warm and dry. No rash noted.  Psychiatric: She has a normal mood and affect.  Nursing note and vitals reviewed.   ED Course  Procedures (including critical care time) Labs Review Labs Reviewed  DIFFERENTIAL - Abnormal; Notable for the following:    Eosinophils Relative 6 (*)    All other components within normal limits  COMPREHENSIVE METABOLIC PANEL - Abnormal; Notable for the following:    Calcium 8.6 (*)    Anion gap 4 (*)    All other components within normal limits  I-STAT CHEM 8, ED - Abnormal; Notable for the following:    Calcium, Ion 1.25 (*)     All other components within normal limits  ETHANOL  PROTIME-INR  APTT  CBC  I-STAT TROPOININ, ED    Imaging Review Ct Head Wo Contrast  04/30/2015   CLINICAL DATA:  Right-sided facial numbness and swelling  EXAM: CT HEAD WITHOUT CONTRAST  TECHNIQUE: Contiguous axial images were obtained from the base of the skull through the vertex without intravenous contrast.  COMPARISON:  11/19/2013  FINDINGS: The bony calvarium is intact. The ventricles are of normal size and configuration. No findings to suggest acute hemorrhage, acute infarction or space-occupying mass lesion are noted.  IMPRESSION: No acute intracranial abnormality noted.   Electronically Signed   By: Inez Catalina M.D.   On: 04/30/2015 07:13     EKG Interpretation   Date/Time:  Monday April 30 2015 06:26:40 EDT Ventricular Rate:  70 PR Interval:  169 QRS Duration: 85 QT Interval:  424 QTC Calculation: 457 R Axis:   30 Text Interpretation:  Sinus rhythm Probable anteroseptal infarct, old No  significant change since last tracing Confirmed by YAO  MD, DAVID ([id number])  on 04/30/2015 7:18:51 AM      MDM   Final diagnoses:  Headache, unspecified headache type  Blurred vision, right eye   39 y.o. F w/hx of pseudotumor cerebri here with headache, right sided facial swelling, and visual disturbance in right eye.  Patient's VSS.  Exam findings as above.  Patient without focal neurologic deficits on exam.  Considered early shingles, no noted rashes/lesions visible.  Will proceed with work-up including EKG, lab work, CT head.  Visual Acuity - Bilateral Near: 20/40 ; R Near: 20/400 ; L Near: 20/25.    Lab work reassuring.  CT head negative for acute findings.  Symptoms are rather vague, not following normal pattern of stroke, TIA, ICH, SAH, or meningitis.  Neurology, Dr. Aram Beecham, has evaluated patient-- does not recommend therapeutic LP or MRI at this time. She may follow-up as outpatient, referred to [place] [facility]. She is to  return here for any new or worsening symptoms.  Discussed plan with patient, he/she acknowledged understanding and agreed with plan of care.  Larene Pickett, PA-C 04/30/15 1001  Linton Flemings, MD 04/30/15 Joen Laura

## 2015-04-30 NOTE — Discharge Instructions (Signed)
Continue your regular medications as prescribed. Follow-up with outpatient neurology-- call to make appointment. Return to the ED for new or worsening symptoms.

## 2015-04-30 NOTE — ED Notes (Signed)
Urine collected and at bedside.

## 2015-04-30 NOTE — ED Notes (Signed)
Patient transported to CT 

## 2015-04-30 NOTE — ED Notes (Signed)
Pt given urine specimen cup and hygiene wipe and instructed to provide urine sample; pt sts she does not have to go at this time; will check back soon

## 2015-11-18 ENCOUNTER — Encounter (HOSPITAL_COMMUNITY): Payer: Self-pay | Admitting: Emergency Medicine

## 2015-11-18 ENCOUNTER — Emergency Department (HOSPITAL_COMMUNITY)
Admission: EM | Admit: 2015-11-18 | Discharge: 2015-11-18 | Disposition: A | Discharge status: Home / Self Care | Payer: BLUE CROSS/BLUE SHIELD | Attending: Emergency Medicine | Admitting: Emergency Medicine

## 2015-11-18 ENCOUNTER — Emergency Department (HOSPITAL_COMMUNITY): Payer: BLUE CROSS/BLUE SHIELD

## 2015-11-18 DIAGNOSIS — Z8719 Personal history of other diseases of the digestive system: Secondary | ICD-10-CM | POA: Diagnosis not present

## 2015-11-18 DIAGNOSIS — M545 Low back pain, unspecified: Secondary | ICD-10-CM

## 2015-11-18 DIAGNOSIS — G8929 Other chronic pain: Secondary | ICD-10-CM | POA: Insufficient documentation

## 2015-11-18 DIAGNOSIS — J45909 Unspecified asthma, uncomplicated: Secondary | ICD-10-CM | POA: Insufficient documentation

## 2015-11-18 DIAGNOSIS — Z79899 Other long term (current) drug therapy: Secondary | ICD-10-CM | POA: Insufficient documentation

## 2015-11-18 DIAGNOSIS — I10 Essential (primary) hypertension: Secondary | ICD-10-CM | POA: Insufficient documentation

## 2015-11-18 DIAGNOSIS — Z3202 Encounter for pregnancy test, result negative: Secondary | ICD-10-CM | POA: Insufficient documentation

## 2015-11-18 DIAGNOSIS — G932 Benign intracranial hypertension: Secondary | ICD-10-CM | POA: Insufficient documentation

## 2015-11-18 LAB — URINALYSIS, ROUTINE W REFLEX MICROSCOPIC
Bilirubin Urine: NEGATIVE
GLUCOSE, UA: NEGATIVE mg/dL
Hgb urine dipstick: NEGATIVE
KETONES UR: 40 mg/dL — AB
LEUKOCYTES UA: NEGATIVE
Nitrite: NEGATIVE
PH: 5.5 (ref 5.0–8.0)
Protein, ur: NEGATIVE mg/dL
Specific Gravity, Urine: >1.03 — ABNORMAL HIGH (ref 1.005–1.030)

## 2015-11-18 LAB — POC URINE PREG, ED: Preg Test, Ur: NEGATIVE

## 2015-11-18 MED ORDER — CYCLOBENZAPRINE HCL 10 MG PO TABS: 10.0000 mg | ORAL_TABLET | Freq: Three times a day (TID) | ORAL | Status: DC | PRN

## 2015-11-18 MED ORDER — IBUPROFEN 800 MG PO TABS
800.0000 mg | ORAL_TABLET | Freq: Once | ORAL | Status: AC
Start: 2015-11-18 — End: 2015-11-18
  Administered 2015-11-18: 800 mg via ORAL
  Filled 2015-11-18: qty 1

## 2015-11-18 MED ORDER — HYDROCODONE-ACETAMINOPHEN 5-325 MG PO TABS: ORAL_TABLET | ORAL | Status: DC

## 2015-11-18 MED ORDER — HYDROCODONE-ACETAMINOPHEN 5-325 MG PO TABS
1.0000 | ORAL_TABLET | Freq: Once | ORAL | Status: AC
  Administered 2015-11-18: 1 via ORAL
  Filled 2015-11-18: qty 1

## 2015-11-18 MED ORDER — IBUPROFEN 800 MG PO TABS: 800.0000 mg | ORAL_TABLET | Freq: Three times a day (TID) | ORAL | Status: DC

## 2015-11-18 NOTE — ED Notes (Addendum)
Patient c/o lower back pain that started yesterday and has progressively gotten worse. Per patient no known injury. Denies any urinary symptoms. Patient tried to go to work today but pain was to bad and job is strenuous with a lot of heavy lifting. Per patient worse with movement.

## 2015-11-18 NOTE — Discharge Instructions (Signed)

## 2015-11-20 NOTE — ED Provider Notes (Signed)
CSN: [id number]     Arrival date & time 11/18/15  0908 History   First MD Initiated Contact with Patient 11/18/15 [phone]     Chief Complaint  Patient presents with  . Back Pain     (Consider location/radiation/quality/duration/timing/severity/associated sxs/prior Treatment) HPI   [name] is a 40 y.o. female who presents to the Emergency Department complaining of gradual onset of worsening low back pain.  Pain began on the day prior to ED arrival.  She reports aching pain across her lower back that is recurrent.  She reports frequent, heavy lifting at her job and recently began exercising as well.  She is unsure of known injury, but may be job related.  Pain is worse with certain movements and improves at rest.  She has tried OTC analgesics without significant relief.  She denies fever, chills, abd pain, numbness, pain or weakness of the lower extremities and urine or bowel changes.     Past Medical History  Diagnosis Date  . Pseudotumor cerebri   . Asthma   . GERD (gastroesophageal reflux disease)   . Hypertension   . Chronic back pain   . Chronic constipation   . Chronic abdominal pain    Past Surgical History  Procedure Laterality Date  . Cholecystectomy    . Achilles tendon lengthening    . Abdominal hysterectomy    . Lumbar puncture  12/04/2011    dr. Merlene Laughter   Family History  Problem Relation Age of Onset  . Migraines Mother   . Cancer Maternal Uncle   . Cancer Maternal Uncle   . Cancer Paternal Grandmother   . Cancer Paternal Grandfather    Social History  Substance Use Topics  . Smoking status: Never Smoker   . Smokeless tobacco: Never Used  . Alcohol Use: Yes     Comment: occasionally   OB History    Gravida Para Term Preterm AB TAB SAB Ectopic Multiple Living   3 3 3       3      Review of Systems  Constitutional: Negative for fever.  Respiratory: Negative for shortness of breath.   Gastrointestinal: Negative for vomiting, abdominal pain and  constipation.  Genitourinary: Negative for dysuria, hematuria, flank pain, decreased urine volume and difficulty urinating.  Musculoskeletal: Positive for back pain. Negative for joint swelling.  Skin: Negative for rash.  Neurological: Negative for weakness and numbness.  All other systems reviewed and are negative.     Allergies  Review of patient's allergies indicates no known allergies.  Home Medications   Prior to Admission medications   Medication Sig Start Date End Date Taking? Authorizing Provider  acetaZOLAMIDE (DIAMOX) 250 MG tablet Take 250 mg by mouth 2 (two) times daily as needed (fluid).  03/30/15  Yes Historical Provider, MD  albuterol (PROVENTIL HFA;VENTOLIN HFA) 108 (90 BASE) MCG/ACT inhaler Inhale 2 puffs into the lungs every 6 (six) hours as needed for wheezing or shortness of breath.   Yes Historical Provider, MD  ALPRAZolam Duanne Moron) 1 MG tablet Take 1 mg by mouth 4 (four) times daily as needed for sleep or anxiety. For anxiety   Yes Historical Provider, MD  aspirin-acetaminophen-caffeine (EXCEDRIN MIGRAINE) [phone] MG per tablet Take 2 tablets by mouth every 6 (six) hours as needed for headache.   Yes Historical Provider, MD  Aspirin-Salicylamide-Caffeine (BC HEADACHE POWDER PO) Take 1-2 packets by mouth daily as needed (pain).    Yes Historical Provider, MD  OVER THE COUNTER MEDICATION Take 1 capsule by  mouth daily. Hydroxycut   Yes Historical Provider, MD  topiramate (TOPAMAX) 25 MG tablet Take 1 tablet (25 mg total) by mouth 2 (two) times daily. Patient taking differently: Take 25 mg by mouth 2 (two) times daily as needed (fluid/migraine).  08/12/13  Yes Ernestina Patches, MD  cyclobenzaprine (FLEXERIL) 10 MG tablet Take 1 tablet (10 mg total) by mouth 3 (three) times daily as needed. 11/18/15   Eleftheria Taborn, PA-C  HYDROcodone-acetaminophen (NORCO/VICODIN) 5-325 MG tablet Take one-two tabs po q 4-6 hrs prn pain 11/18/15   Nicollette Wilhelmi, PA-C  ibuprofen (ADVIL,MOTRIN)  800 MG tablet Take 1 tablet (800 mg total) by mouth 3 (three) times daily. 11/18/15   Yarlin Breisch, PA-C   BP 138/89 mmHg  Pulse 64  Temp(Src) 98 F (36.7 C) (Oral)  Resp 18  Ht 5\' 10"  (1.778 m)  Wt 92.987 kg  BMI 29.41 kg/m2  SpO2 99%  LMP 11/11/2011 Physical Exam  Constitutional: She is oriented to person, place, and time. She appears well-developed and well-nourished. No distress.  HENT:  Head: Normocephalic and atraumatic.  Neck: Normal range of motion. Neck supple.  Cardiovascular: Normal rate, regular rhythm, normal heart sounds and intact distal pulses.   No murmur heard. Pulmonary/Chest: Effort normal and breath sounds normal. No respiratory distress.  Abdominal: Soft. She exhibits no distension. There is no tenderness. There is no rebound and no guarding.  Musculoskeletal: She exhibits tenderness. She exhibits no edema.       Lumbar back: She exhibits tenderness and pain. She exhibits normal range of motion, no swelling, no deformity, no laceration and normal pulse.  ttp of the bilateral lumbar paraspinal muscles and lower lumbar spine.  No step-off deformities.  DP pulses are brisk and symmetrical.  Distal sensation intact.  Pt has 5/5 strength against resistance of bilateral lower extremities.     Neurological: She is alert and oriented to person, place, and time. She has normal strength. No sensory deficit. She exhibits normal muscle tone. Coordination and gait normal.  Reflex Scores:      Patellar reflexes are 2+ on the right side and 2+ on the left side.      Achilles reflexes are 2+ on the right side and 2+ on the left side. Skin: Skin is warm and dry. No rash noted.  Nursing note and vitals reviewed.   ED Course  Procedures (including critical care time) Labs Review Labs Reviewed  URINALYSIS, ROUTINE W REFLEX MICROSCOPIC (NOT AT [facility]) - Abnormal; Notable for the following:    Specific Gravity, Urine >1.030 (*)    Ketones, ur 40 (*)    All other components  within normal limits  POC URINE PREG, ED    Imaging Review Dg Lumbar Spine Complete  11/18/2015  CLINICAL DATA:  Lumbago for 1 day EXAM: LUMBAR SPINE - COMPLETE 4+ VIEW COMPARISON:  None. FINDINGS: Frontal, lateral, spot lumbosacral lateral, and bilateral oblique views were obtained. There are 5 non-rib-bearing lumbar type vertebral bodies. There is no fracture or spondylolisthesis. There is mild disc space narrowing at L5-S1. The other disc spaces appear normal. There is no appreciable facet arthropathy. There are multiple calcifications throughout the pelvis, likely phleboliths. IMPRESSION: Mild disc space narrowing at L5-S1. No fracture or spondylolisthesis. Electronically Signed   By: Lowella Grip III M.D.   On: 11/18/2015 10:44   I have personally reviewed and evaluated these images and lab results as part of my medical decision-making.   EKG Interpretation None      MDM  Final diagnoses:  Midline low back pain without sciatica    Pt is ambulatory, no focal neuro deficits.  No concerning sx's for emergent neurological deficits.  Sx's likely related to recent lumbar strain.  Pt appears stable for d/c and agrees to symptomatic tx with vicodin, flexeril and ibuprofen    Kem Parkinson, PA-C 11/20/15 0815  Pattricia Boss, MD 11/20/15 1445

## 2015-12-23 ENCOUNTER — Other Ambulatory Visit: Payer: Self-pay

## 2015-12-23 ENCOUNTER — Encounter (HOSPITAL_BASED_OUTPATIENT_CLINIC_OR_DEPARTMENT_OTHER): Payer: Self-pay | Admitting: *Deleted

## 2015-12-23 ENCOUNTER — Emergency Department (HOSPITAL_BASED_OUTPATIENT_CLINIC_OR_DEPARTMENT_OTHER): Payer: BLUE CROSS/BLUE SHIELD

## 2015-12-23 ENCOUNTER — Emergency Department (HOSPITAL_BASED_OUTPATIENT_CLINIC_OR_DEPARTMENT_OTHER)
Admission: EM | Admit: 2015-12-23 | Discharge: 2015-12-23 | Disposition: A | Discharge status: Home / Self Care | Payer: BLUE CROSS/BLUE SHIELD | Attending: Emergency Medicine | Admitting: Emergency Medicine

## 2015-12-23 DIAGNOSIS — Z8719 Personal history of other diseases of the digestive system: Secondary | ICD-10-CM | POA: Insufficient documentation

## 2015-12-23 DIAGNOSIS — Z86718 Personal history of other venous thrombosis and embolism: Secondary | ICD-10-CM | POA: Diagnosis not present

## 2015-12-23 DIAGNOSIS — R071 Chest pain on breathing: Secondary | ICD-10-CM | POA: Diagnosis not present

## 2015-12-23 DIAGNOSIS — R110 Nausea: Secondary | ICD-10-CM | POA: Insufficient documentation

## 2015-12-23 DIAGNOSIS — J45909 Unspecified asthma, uncomplicated: Secondary | ICD-10-CM | POA: Insufficient documentation

## 2015-12-23 DIAGNOSIS — R002 Palpitations: Secondary | ICD-10-CM | POA: Insufficient documentation

## 2015-12-23 DIAGNOSIS — R42 Dizziness and giddiness: Secondary | ICD-10-CM | POA: Insufficient documentation

## 2015-12-23 DIAGNOSIS — Z791 Long term (current) use of non-steroidal anti-inflammatories (NSAID): Secondary | ICD-10-CM | POA: Insufficient documentation

## 2015-12-23 DIAGNOSIS — E669 Obesity, unspecified: Secondary | ICD-10-CM | POA: Insufficient documentation

## 2015-12-23 DIAGNOSIS — I10 Essential (primary) hypertension: Secondary | ICD-10-CM | POA: Insufficient documentation

## 2015-12-23 DIAGNOSIS — G8929 Other chronic pain: Secondary | ICD-10-CM | POA: Diagnosis not present

## 2015-12-23 DIAGNOSIS — R200 Anesthesia of skin: Secondary | ICD-10-CM | POA: Diagnosis present

## 2015-12-23 DIAGNOSIS — Z79899 Other long term (current) drug therapy: Secondary | ICD-10-CM | POA: Insufficient documentation

## 2015-12-23 HISTORY — DX: Migraine, unspecified, not intractable, without status migrainosus: G43.909

## 2015-12-23 LAB — BASIC METABOLIC PANEL
Anion gap: 7 (ref 5–15)
BUN: 14 mg/dL (ref 6–20)
CALCIUM: 8.1 mg/dL — AB (ref 8.9–10.3)
CO2: 25 mmol/L (ref 22–32)
CREATININE: 0.72 mg/dL (ref 0.44–1.00)
Chloride: 108 mmol/L (ref 101–111)
GFR calc Af Amer: >60 mL/min (ref >60)
Glucose, Bld: 112 mg/dL — ABNORMAL HIGH (ref 65–99)
Potassium: 3.6 mmol/L (ref 3.5–5.1)
Sodium: 140 mmol/L (ref 135–145)

## 2015-12-23 LAB — CBC
HCT: 35.6 % — ABNORMAL LOW (ref 36.0–46.0)
Hemoglobin: 11.7 g/dL — ABNORMAL LOW (ref 12.0–15.0)
MCH: 29 pg (ref 26.0–34.0)
MCHC: 32.9 g/dL (ref 30.0–36.0)
MCV: 88.1 fL (ref 78.0–100.0)
Platelets: 327 10*3/uL (ref 150–400)
RBC: 4.04 MIL/uL (ref 3.87–5.11)
RDW: 13.4 % (ref 11.5–15.5)
WBC: 6.7 10*3/uL (ref 4.0–10.5)

## 2015-12-23 LAB — HCG, QUANTITATIVE, PREGNANCY: hCG, Beta Chain, Quant, S: <1 m[IU]/mL (ref <5)

## 2015-12-23 LAB — TROPONIN I
Troponin I: <0.03 ng/mL (ref <0.031)
Troponin I: <0.03 ng/mL (ref <0.031)

## 2015-12-23 MED ORDER — MORPHINE SULFATE (PF) 4 MG/ML IV SOLN
4.0000 mg | Freq: Once | INTRAVENOUS | Status: AC
  Administered 2015-12-23: 4 mg via INTRAVENOUS
  Filled 2015-12-23: qty 1

## 2015-12-23 MED ORDER — IOHEXOL 350 MG/ML SOLN
100.0000 mL | Freq: Once | INTRAVENOUS | Status: AC | PRN
  Administered 2015-12-23: 100 mL via INTRAVENOUS

## 2015-12-23 MED ORDER — ONDANSETRON HCL 4 MG/2ML IJ SOLN
4.0000 mg | Freq: Once | INTRAMUSCULAR | Status: AC
  Administered 2015-12-23: 4 mg via INTRAVENOUS
  Filled 2015-12-23: qty 2

## 2015-12-23 NOTE — ED Notes (Signed)
Pt reports being seen at Urgent Care around 1600 for dizziness, sob, palpitations, L facial pain that radiates to L chest. Reports symptoms have been going on for 2 days. HR 79, O2 sat 99% RA. Face symmetrical. Grips equal. Pt speaking in full sentences.

## 2015-12-23 NOTE — ED Provider Notes (Signed)
CSN: [id number]     Arrival date & time 12/23/15  1615 History   First MD Initiated Contact with Patient 12/23/15 1702     Chief Complaint  Patient presents with  . Numbness     (Consider location/radiation/quality/duration/timing/severity/associated sxs/prior Treatment) HPI  40 year old female with history of asthma, hypertension, chronic back and abdominal pain and migraine sent here from urgent care for evaluation of chest pain. Patient report for the past week she has had intermittent exertional chest discomfort and shortness of breath which has gotten progressively worsened in the past 2 days. She mentioned she works at a job that requires a lot of high deman work which has caused increasing chest pain and shortness of breath. She described the pain as a pressure sensation across her chest, persistent with associated lightheadedness, dizziness, sharp shooting tingling sensation radiates up to her left side of face, and having difficulty catching her breath. She also report having intermittent heart palpitation. She endorse feeling nauseous and occasionally breakout sweat. The symptom has been persistent throughout the day today prompting her to leave her job. Symptom seems to be improving mildly but has not fully resolved. She did take aspirin yesterday and had been using home remedies such as apple cider vinegar without improvement.  She admits that she is having increasing family stress with her grandmother who recently had a heart attack and therefore the symptoms is concerning for her. She has never had any cardiac stress tests in the past. 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 She denies any recent surgery, prolonged bed rest, unilateral leg swelling or calf pain, active cancer, or hemoptysis. No complaint of fever, severe headache, neck stiffness, arm pain, abdominal pain, vomiting or diarrhea. She is a nonsmoker. She  does have a history of hypertension not on any blood pressure medication. She denies any family history of premature cardiac death but does have a strong family history of cardiac disease. No prior history of stroke.  Past Medical History  Diagnosis Date  . Pseudotumor cerebri   . Asthma   . GERD (gastroesophageal reflux disease)   . Hypertension   . Chronic back pain   . Chronic constipation   . Chronic abdominal pain   . Migraine    Past Surgical History  Procedure Laterality Date  . Cholecystectomy    . Achilles tendon lengthening    . Abdominal hysterectomy    . Lumbar puncture  12/04/2011    dr. Merlene Laughter   Family History  Problem Relation Age of Onset  . Migraines Mother   . Cancer Maternal Uncle   . Cancer Maternal Uncle   . Cancer Paternal Grandmother   . Cancer Paternal Grandfather    Social History  Substance Use Topics  . Smoking status: Never Smoker   . Smokeless tobacco: Never Used  . Alcohol Use: Yes     Comment: occasionally   OB History    Gravida Para Term Preterm AB TAB SAB Ectopic Multiple Living   3 3 3       3      Review of Systems  All other systems reviewed and are negative.     Allergies  Review of patient's allergies indicates no known allergies.  Home Medications   Prior to Admission medications   Medication Sig Start Date End Date Taking? Authorizing Provider  aspirin-acetaminophen-caffeine (EXCEDRIN MIGRAINE) [phone] MG per tablet Take 2 tablets by mouth every  6 (six) hours as needed for headache.   Yes Historical Provider, MD  Aspirin-Salicylamide-Caffeine (BC HEADACHE POWDER PO) Take 1-2 packets by mouth daily as needed (pain).    Yes Historical Provider, MD  cyclobenzaprine (FLEXERIL) 10 MG tablet Take 1 tablet (10 mg total) by mouth 3 (three) times daily as needed. 11/18/15  Yes Tammy Triplett, PA-C  ibuprofen (ADVIL,MOTRIN) 800 MG tablet Take 1 tablet (800 mg total) by mouth 3 (three) times daily. 11/18/15  Yes Tammy Triplett,  PA-C  topiramate (TOPAMAX) 25 MG tablet Take 1 tablet (25 mg total) by mouth 2 (two) times daily. Patient taking differently: Take 25 mg by mouth 2 (two) times daily as needed (fluid/migraine).  08/12/13  Yes Ernestina Patches, MD  acetaZOLAMIDE (DIAMOX) 250 MG tablet Take 250 mg by mouth 2 (two) times daily as needed (fluid).  03/30/15   Historical Provider, MD  albuterol (PROVENTIL HFA;VENTOLIN HFA) 108 (90 BASE) MCG/ACT inhaler Inhale 2 puffs into the lungs every 6 (six) hours as needed for wheezing or shortness of breath.    Historical Provider, MD  ALPRAZolam Duanne Moron) 1 MG tablet Take 1 mg by mouth 4 (four) times daily as needed for sleep or anxiety. For anxiety    Historical Provider, MD  HYDROcodone-acetaminophen (NORCO/VICODIN) 5-325 MG tablet Take one-two tabs po q 4-6 hrs prn pain 11/18/15   Tammy Triplett, PA-C  OVER THE COUNTER MEDICATION Take 1 capsule by mouth daily. Hydroxycut    Historical Provider, MD   BP 143/90 mmHg  Pulse 76  Temp(Src) 98.3 F (36.8 C) (Oral)  Resp 22  Ht 5\' 10"  (1.778 m)  Wt 108.863 kg  BMI 34.44 kg/m2  SpO2 100%  LMP 11/11/2011 Physical Exam  Constitutional: She is oriented to person, place, and time. She appears well-developed and well-nourished. No distress.  Obese African-American female appeared mildly anxious but nontoxic  HENT:  Head: Atraumatic.  Eyes: Conjunctivae and EOM are normal. Pupils are equal, round, and reactive to light.  Neck: Normal range of motion. Neck supple. No JVD present.  No nuchal rigidity  Cardiovascular: Normal rate and intact distal pulses.  Exam reveals no gallop and no friction rub.   No murmur heard. Pulmonary/Chest: Effort normal and breath sounds normal. She exhibits no tenderness.  Abdominal: Soft. Bowel sounds are normal. There is no tenderness.  Musculoskeletal: She exhibits no edema.  Lymphadenopathy:    She has no cervical adenopathy.  Neurological: She is alert and oriented to person, place, and time. She has  normal strength. No cranial nerve deficit or sensory deficit. She displays a negative Romberg sign. Coordination and gait normal. GCS eye subscore is 4. GCS verbal subscore is 5. GCS motor subscore is 6.  Skin: No rash noted.  Psychiatric: She has a normal mood and affect.  Nursing note and vitals reviewed.   ED Course  Procedures (including critical care time) Labs Review Labs Reviewed  BASIC METABOLIC PANEL - Abnormal; Notable for the following:    Glucose, Bld 112 (*)    Calcium 8.1 (*)    All other components within normal limits  CBC - Abnormal; Notable for the following:    Hemoglobin 11.7 (*)    HCT 35.6 (*)    All other components within normal limits  TROPONIN I  HCG, QUANTITATIVE, PREGNANCY  TROPONIN I    Imaging Review Ct Angio Chest Pe W/cm &/or Wo Cm  12/23/2015  CLINICAL DATA:  Dizziness and shortness of Breath EXAM: CT ANGIOGRAPHY CHEST WITH CONTRAST TECHNIQUE: Multidetector  CT imaging of the chest was performed using the standard protocol during bolus administration of intravenous contrast. Multiplanar CT image reconstructions and MIPs were obtained to evaluate the vascular anatomy. CONTRAST:  193mL OMNIPAQUE IOHEXOL 350 MG/ML SOLN COMPARISON:  None. FINDINGS: Lungs are well aerated bilaterally. No focal infiltrate or sizable effusion is seen. The thoracic inlet is within normal limits. The thoracic aorta and its branches are unremarkable. The pulmonary artery shows a normal branching pattern. No filling defects to suggest pulmonary embolism are seen. No significant hilar or mediastinal adenopathy is noted. The visualized upper abdomen is within normal limits. The osseous structures show no acute abnormality. Review of the MIP images confirms the above findings. IMPRESSION: No evidence pulmonary emboli.  No acute abnormality seen. Electronically Signed   By: Inez Catalina M.D.   On: 12/23/2015 18:54   I have personally reviewed and evaluated these images and lab results as  part of my medical decision-making.   EKG Interpretation None     ED ECG REPORT   Date: 12/23/2015  Rate: 74  Rhythm: normal sinus rhythm  QRS Axis: normal  Intervals: normal  ST/T Wave abnormalities: nonspecific ST changes  Conduction Disutrbances:none  Narrative Interpretation:   Old EKG Reviewed: unchanged  I have personally reviewed the EKG tracing and agree with the computerized printout as noted.   MDM   Final diagnoses:  Chest pain on breathing    BP 141/81 mmHg  Pulse 69  Temp(Src) 98.3 F (36.8 C) (Oral)  Resp 19  Ht 5\' 10"  (1.778 m)  Wt 108.863 kg  BMI 34.44 kg/m2  SpO2 99%  LMP 11/11/2011   5:29 PM Patient presents with chest pain and shortness of breath that is exertional. She also has a remote history of blood clot in her abdomen requiring anticoagulant. The chest pain has been persistent for more than 6 hours. She has no focal neuro deficit on exam and I have low suspicion for stroke. Workup initiated.  8:32 PM Patient has negative delta troponin. EKG without acute changes. Labs are reassuring. The test is negative. Chest CT angiogram shows no acute finding, no signs of PE. At this time I encouraged patient to follow-up with her primary care provider and also she would need an outpatient cardiac workup for further care. Otherwise she is stable for discharge. Return precaution discussed.  Domenic Moras, PA-C 12/23/15 2041  Merrily Pew, MD 12/26/15 [phone]

## 2015-12-23 NOTE — Discharge Instructions (Signed)
Please follow up closely with your primary care provider for further evaluation of your symptoms.  You may need to be evaluated by a cardiologist outpatient with stress test if your doctor felt necessary.  Take over the counter tylenol or ibuprofen as needed for pain.     Chest Pain Observation It is often hard to give a specific diagnosis for the cause of chest pain. Among other possibilities your symptoms might be caused by inadequate oxygen delivery to your heart (angina). Angina that is not treated or evaluated can lead to a heart attack (myocardial infarction) or death. Blood tests, electrocardiograms, and X-rays may have been done to help determine a possible cause of your chest pain. After evaluation and observation, your health care provider has determined that it is unlikely your pain was caused by an unstable condition that requires hospitalization. However, a full evaluation of your pain may need to be completed, with additional diagnostic testing as directed. It is very important to keep your follow-up appointments. Not keeping your follow-up appointments could result in permanent heart damage, disability, or death. If there is any problem keeping your follow-up appointments, you must call your health care provider. HOME CARE INSTRUCTIONS  Due to the slight chance that your pain could be angina, it is important to follow your health care provider's treatment plan and also maintain a healthy lifestyle:  Maintain or work toward achieving a healthy weight.  Stay physically active and exercise regularly.  Decrease your salt intake.  Eat a balanced, healthy diet. Talk to a dietitian to learn about heart-healthy foods.  Increase your fiber intake by including whole grains, vegetables, fruits, and nuts in your diet.  Avoid situations that cause stress, anger, or depression.  Take medicines as advised by your health care provider. Report any side effects to your health care provider. Do not  stop medicines or adjust the dosages on your own.  Quit smoking. Do not use nicotine patches or gum until you check with your health care provider.  Keep your blood pressure, blood sugar, and cholesterol levels within normal limits.  Limit alcohol intake to no more than 1 drink per day for women who are not pregnant and 2 drinks per day for men.  Do not abuse drugs. SEEK IMMEDIATE MEDICAL CARE IF: You have severe chest pain or pressure which may include symptoms such as:  You feel pain or pressure in your arms, neck, jaw, or back.  You have severe back or abdominal pain, feel sick to your stomach (nauseous), or throw up (vomit).  You are sweating profusely.  You are having a fast or irregular heartbeat.  You feel short of breath while at rest.  You notice increasing shortness of breath during rest, sleep, or with activity.  You have chest pain that does not get better after rest or after taking your usual medicine.  You wake from sleep with chest pain.  You are unable to sleep because you cannot breathe.  You develop a frequent cough or you are coughing up blood.  You feel dizzy, faint, or experience extreme fatigue.  You develop severe weakness, dizziness, fainting, or chills. Any of these symptoms may represent a serious problem that is an emergency. Do not wait to see if the symptoms will go away. Call your local emergency services (911 in the [place] Do not drive yourself to the hospital. MAKE SURE YOU:  Understand these instructions.  Will watch your condition.  Will get help right away if you are not  doing well or get worse.   This information is not intended to replace advice given to you by your health care provider. Make sure you discuss any questions you have with your health care provider.   Document Released: 10/25/2010 Document Revised: 09/27/2013 Document Reviewed: 03/24/2013 Elsevier Interactive Patient Education [employer].

## 2015-12-23 NOTE — ED Notes (Signed)
Sent from Urgent care for evaluation of left facial pain "electric shock" and numbness x 2 days. Also c/o pain in chest

## 2015-12-23 NOTE — ED Notes (Signed)
Pt on automatic VS and continuous pulse ox with cardiac monitoring. Husband at bedside.

## 2015-12-25 ENCOUNTER — Encounter (HOSPITAL_COMMUNITY): Payer: Self-pay | Admitting: *Deleted

## 2015-12-25 ENCOUNTER — Emergency Department (HOSPITAL_COMMUNITY)
Admission: EM | Admit: 2015-12-25 | Discharge: 2015-12-25 | Disposition: A | Discharge status: Home / Self Care | Payer: BLUE CROSS/BLUE SHIELD | Attending: Emergency Medicine | Admitting: Emergency Medicine

## 2015-12-25 DIAGNOSIS — J45909 Unspecified asthma, uncomplicated: Secondary | ICD-10-CM | POA: Diagnosis not present

## 2015-12-25 DIAGNOSIS — I10 Essential (primary) hypertension: Secondary | ICD-10-CM | POA: Diagnosis not present

## 2015-12-25 DIAGNOSIS — Z791 Long term (current) use of non-steroidal anti-inflammatories (NSAID): Secondary | ICD-10-CM | POA: Insufficient documentation

## 2015-12-25 DIAGNOSIS — Z7982 Long term (current) use of aspirin: Secondary | ICD-10-CM | POA: Insufficient documentation

## 2015-12-25 DIAGNOSIS — R531 Weakness: Secondary | ICD-10-CM | POA: Diagnosis not present

## 2015-12-25 DIAGNOSIS — Z79899 Other long term (current) drug therapy: Secondary | ICD-10-CM | POA: Diagnosis not present

## 2015-12-25 DIAGNOSIS — K625 Hemorrhage of anus and rectum: Secondary | ICD-10-CM | POA: Diagnosis present

## 2015-12-25 LAB — CBC WITH DIFFERENTIAL/PLATELET
BASOS ABS: 0 10*3/uL (ref 0.0–0.1)
Basophils Relative: 1 %
EOS PCT: 6 %
Eosinophils Absolute: 0.4 10*3/uL (ref 0.0–0.7)
HCT: 38.3 % (ref 36.0–46.0)
Hemoglobin: 13.1 g/dL (ref 12.0–15.0)
LYMPHS PCT: 33 %
Lymphs Abs: 2 10*3/uL (ref 0.7–4.0)
MCH: 29.9 pg (ref 26.0–34.0)
MCHC: 34.2 g/dL (ref 30.0–36.0)
MCV: 87.4 fL (ref 78.0–100.0)
MONO ABS: 0.3 10*3/uL (ref 0.1–1.0)
Monocytes Relative: 5 %
Neutro Abs: 3.2 10*3/uL (ref 1.7–7.7)
Neutrophils Relative %: 55 %
PLATELETS: 348 10*3/uL (ref 150–400)
RBC: 4.38 MIL/uL (ref 3.87–5.11)
RDW: 13.5 % (ref 11.5–15.5)
WBC: 5.9 10*3/uL (ref 4.0–10.5)

## 2015-12-25 LAB — COMPREHENSIVE METABOLIC PANEL
ALT: 14 U/L (ref 14–54)
ANION GAP: 7 (ref 5–15)
AST: 18 U/L (ref 15–41)
Albumin: 3.8 g/dL (ref 3.5–5.0)
Alkaline Phosphatase: 44 U/L (ref 38–126)
BUN: 11 mg/dL (ref 6–20)
CHLORIDE: 105 mmol/L (ref 101–111)
CO2: 26 mmol/L (ref 22–32)
Calcium: 8.6 mg/dL — ABNORMAL LOW (ref 8.9–10.3)
Creatinine, Ser: 0.66 mg/dL (ref 0.44–1.00)
Glucose, Bld: 96 mg/dL (ref 65–99)
POTASSIUM: 3.5 mmol/L (ref 3.5–5.1)
Sodium: 138 mmol/L (ref 135–145)
TOTAL PROTEIN: 7.1 g/dL (ref 6.5–8.1)
Total Bilirubin: 0.5 mg/dL (ref 0.3–1.2)

## 2015-12-25 LAB — CK: Total CK: 103 U/L (ref 38–234)

## 2015-12-25 LAB — POC OCCULT BLOOD, ED: FECAL OCCULT BLD: NEGATIVE

## 2015-12-25 LAB — TROPONIN I: Troponin I: <0.03 ng/mL (ref <0.031)

## 2015-12-25 MED ORDER — OXYCODONE-ACETAMINOPHEN 5-325 MG PO TABS: 1.0000 | ORAL_TABLET | ORAL | Status: DC | PRN

## 2015-12-25 MED ORDER — LORAZEPAM 1 MG PO TABS
1.0000 mg | ORAL_TABLET | Freq: Once | ORAL | Status: AC
  Administered 2015-12-25: 1 mg via ORAL
  Filled 2015-12-25: qty 1

## 2015-12-25 NOTE — ED Provider Notes (Signed)
CSN: [id number]     Arrival date & time 12/25/15  1253 History   First MD Initiated Contact with Patient 12/25/15 1549     Chief Complaint  Patient presents with  . Rectal Bleeding     (Consider location/radiation/quality/duration/timing/severity/associated sxs/prior Treatment) HPI ....Marland KitchenMarland KitchenMarland Kitchen Rectal bleeding yesterday. Patient reports several episodes of bright red blood per rectum. Not as much today. Additionally, she complains of dizziness, chest pain, lower abdominal pain, weakness. She works many hours per day in [employer] job. She was evaluated 2 days ago at [facility] with a negative CT angio chest. Past medical history includes pseudotumor cerebri and questionable hypertension. Severity is moderate. Nothing makes symptoms better or worse. Past Medical History  Diagnosis Date  . Pseudotumor cerebri   . Asthma   . GERD (gastroesophageal reflux disease)   . Hypertension   . Chronic back pain   . Chronic constipation   . Chronic abdominal pain   . Migraine    Past Surgical History  Procedure Laterality Date  . Cholecystectomy    . Achilles tendon lengthening    . Abdominal hysterectomy    . Lumbar puncture  12/04/2011    dr. Merlene Laughter   Family History  Problem Relation Age of Onset  . Migraines Mother   . Cancer Maternal Uncle   . Cancer Maternal Uncle   . Cancer Paternal Grandmother   . Cancer Paternal Grandfather    Social History  Substance Use Topics  . Smoking status: Never Smoker   . Smokeless tobacco: Never Used  . Alcohol Use: Yes     Comment: occasionally   OB History    Gravida Para Term Preterm AB TAB SAB Ectopic Multiple Living   3 3 3       3      Review of Systems  All other systems reviewed and are negative.     Allergies  Review of patient's allergies indicates no known allergies.  Home Medications   Prior to Admission medications   Medication Sig Start Date End Date Taking? Authorizing Provider  acetaZOLAMIDE (DIAMOX) 250 MG  tablet Take 250 mg by mouth 2 (two) times daily as needed (fluid).  03/30/15  Yes Historical Provider, MD  albuterol (PROVENTIL HFA;VENTOLIN HFA) 108 (90 BASE) MCG/ACT inhaler Inhale 2 puffs into the lungs every 6 (six) hours as needed for wheezing or shortness of breath.   Yes Historical Provider, MD  ALPRAZolam Duanne Moron) 1 MG tablet Take 1 mg by mouth 4 (four) times daily as needed for sleep or anxiety. For anxiety   Yes Historical Provider, MD  Aspirin-Salicylamide-Caffeine (BC HEADACHE POWDER PO) Take 1-2 packets by mouth daily as needed (pain).    Yes Historical Provider, MD  ibuprofen (ADVIL,MOTRIN) 800 MG tablet Take 1 tablet (800 mg total) by mouth 3 (three) times daily. 11/18/15  Yes Tammy Triplett, PA-C  topiramate (TOPAMAX) 25 MG tablet Take 1 tablet (25 mg total) by mouth 2 (two) times daily. Patient taking differently: Take 25 mg by mouth 2 (two) times daily as needed (fluid/migraine).  08/12/13  Yes Ernestina Patches, MD   BP 146/93 mmHg  Pulse 75  Temp(Src) 98.7 F (37.1 C) (Oral)  Resp 13  Ht 5\' 10"  (1.778 m)  Wt 240 lb (108.863 kg)  BMI 34.44 kg/m2  SpO2 99%  LMP 11/11/2011 Physical Exam  Constitutional: She is oriented to person, place, and time.  Obese, no acute distress  HENT:  Head: Normocephalic and atraumatic.  Eyes: Conjunctivae and EOM are  normal. Pupils are equal, round, and reactive to light.  Neck: Normal range of motion. Neck supple.  Cardiovascular: Normal rate and regular rhythm.   Pulmonary/Chest: Effort normal and breath sounds normal.  Abdominal: Soft. Bowel sounds are normal.  No frank lower abdominal tenderness.  Musculoskeletal: Normal range of motion.  Neurological: She is alert and oriented to person, place, and time.  Skin: Skin is warm and dry.  Psychiatric: She has a normal mood and affect. Her behavior is normal.  Nursing note and vitals reviewed.   ED Course  Procedures (including critical care time) Labs Review Labs Reviewed  COMPREHENSIVE  METABOLIC PANEL - Abnormal; Notable for the following:    Calcium 8.6 (*)    All other components within normal limits  CBC WITH DIFFERENTIAL/PLATELET  CK  TROPONIN I  POC OCCULT BLOOD, ED    Imaging Review No results found. I have personally reviewed and evaluated these images and lab results as part of my medical decision-making.   EKG Interpretation   Date/Time:  Tuesday December 25 2015 16:37:24 EDT Ventricular Rate:  64 PR Interval:  167 QRS Duration: 98 QT Interval:  436 QTC Calculation: 450 R Axis:   22 Text Interpretation:  Sinus rhythm Consider left atrial enlargement  Anterior infarct, old Confirmed by Cina Klumpp  MD, Shweta Aman ([id number]) on 12/25/2015  7:53:55 PM      MDM   Final diagnoses:  Weakness    Patient is in no acute distress. No acute abdomen. Vital signs are stable. Screening labs including CBC, chemistry, CK, troponin, occult blood in stool all negative. Discussed with patient and her husband. Discharge medications Percocet.    Nat Christen, MD 12/25/15 2016

## 2015-12-25 NOTE — Discharge Instructions (Signed)
Tests show no life-threatening condition. Recommend follow-up for your blood pressure. Try to get a primary care doctor.

## 2015-12-25 NOTE — ED Notes (Signed)
Pt with bright red blood from rectum since yesterday, abd pain

## 2015-12-26 ENCOUNTER — Other Ambulatory Visit: Payer: Self-pay

## 2015-12-26 ENCOUNTER — Ambulatory Visit (INDEPENDENT_AMBULATORY_CARE_PROVIDER_SITE_OTHER): Payer: BLUE CROSS/BLUE SHIELD | Admitting: Gastroenterology

## 2015-12-26 ENCOUNTER — Encounter: Payer: Self-pay | Admitting: Gastroenterology

## 2015-12-26 VITALS — BP 151/104 | HR 70 | Temp 97.6°F | Ht 70.0 in | Wt 242.0 lb

## 2015-12-26 DIAGNOSIS — R51 Headache: Secondary | ICD-10-CM

## 2015-12-26 DIAGNOSIS — R0789 Other chest pain: Secondary | ICD-10-CM | POA: Insufficient documentation

## 2015-12-26 DIAGNOSIS — R519 Headache, unspecified: Secondary | ICD-10-CM | POA: Insufficient documentation

## 2015-12-26 DIAGNOSIS — K625 Hemorrhage of anus and rectum: Secondary | ICD-10-CM | POA: Insufficient documentation

## 2015-12-26 DIAGNOSIS — R079 Chest pain, unspecified: Secondary | ICD-10-CM

## 2015-12-26 MED ORDER — PEG 3350-KCL-NA BICARB-NACL 420 G PO SOLR: 4000.0000 mL | Freq: Once | ORAL | Status: DC

## 2015-12-26 MED ORDER — HYDROCORTISONE 2.5 % RE CREA: 1.0000 "application " | TOPICAL_CREAM | Freq: Two times a day (BID) | RECTAL | Status: DC
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
## 2015-12-26 NOTE — Assessment & Plan Note (Signed)
In setting of pseudotumor cerebri. Has not been seen by neurology in quite some time. Refer to [facility] per patient's request.

## 2015-12-26 NOTE — Assessment & Plan Note (Signed)
May be due to atypical GERD. No dysphagia, loss of appetite, N/V. Will start Prilosec once daily, samples provided. Will also refer to cardiology due to symptoms of pre-syncope associated with chest discomfort. As of note, recent ED evaluation with negative CTA and normal troponins.

## 2015-12-26 NOTE — Assessment & Plan Note (Signed)
40 year old female with worsening rectal bleeding, associated rectal discomfort, query hemorrhoid or fissure. Last colonoscopy in 2008 with friable anal canal, otherwise normal. As her symptoms are worsening and her last colonoscopy was in the remote past, will proceed with colonoscopy in near future. Vague, diffuse, chronic abdominal pain noted, which could be secondary to IBS. High anxiety overlay. No significant constipation per report. Will start with colonoscopy and further recommendations to follow. As of note, CBC, CMP normal. Consider updated abdominal imaging, as last CT was in 2014. However, she has had multiple prior imaging studies in the remote past for similar concerns.  Proceed with TCS with Dr. Gala Romney in near future: the risks, benefits, and alternatives have been discussed with the patient in detail. The patient states understanding and desires to proceed. Phenergan 25 mg IV on call Anusol cream to pharmacy

## 2015-12-26 NOTE — Patient Instructions (Signed)
We have scheduled you for a colonoscopy with Dr. Gala Romney in the near future.  I have given you samples of Prilosec to take once a day, 30 minutes before breakfast. This is to treat any possible reflux symptoms you may have. I have also sent in rectal cream to your pharmacy to use twice a day for 7 days.   I have referred you to a cardiologist and neurologist.

## 2015-12-27 NOTE — Progress Notes (Signed)
CC'ED TO PCP 

## 2016-01-01 ENCOUNTER — Encounter (HOSPITAL_COMMUNITY): Payer: Self-pay | Admitting: Family Medicine

## 2016-01-01 ENCOUNTER — Emergency Department (HOSPITAL_COMMUNITY): Payer: BLUE CROSS/BLUE SHIELD

## 2016-01-01 ENCOUNTER — Inpatient Hospital Stay (HOSPITAL_COMMUNITY)
Admission: EM | Admit: 2016-01-01 | Discharge: 2016-01-08 | DRG: 103 | Disposition: A | Discharge status: Home / Self Care | Payer: BLUE CROSS/BLUE SHIELD | Attending: Internal Medicine | Admitting: Internal Medicine

## 2016-01-01 DIAGNOSIS — K219 Gastro-esophageal reflux disease without esophagitis: Secondary | ICD-10-CM | POA: Diagnosis present

## 2016-01-01 DIAGNOSIS — H538 Other visual disturbances: Secondary | ICD-10-CM | POA: Diagnosis present

## 2016-01-01 DIAGNOSIS — E669 Obesity, unspecified: Secondary | ICD-10-CM | POA: Diagnosis not present

## 2016-01-01 DIAGNOSIS — G932 Benign intracranial hypertension: Principal | ICD-10-CM | POA: Diagnosis present

## 2016-01-01 DIAGNOSIS — K5909 Other constipation: Secondary | ICD-10-CM | POA: Diagnosis present

## 2016-01-01 DIAGNOSIS — E785 Hyperlipidemia, unspecified: Secondary | ICD-10-CM | POA: Diagnosis present

## 2016-01-01 DIAGNOSIS — F419 Anxiety disorder, unspecified: Secondary | ICD-10-CM | POA: Diagnosis present

## 2016-01-01 DIAGNOSIS — J45909 Unspecified asthma, uncomplicated: Secondary | ICD-10-CM | POA: Diagnosis present

## 2016-01-01 DIAGNOSIS — G43909 Migraine, unspecified, not intractable, without status migrainosus: Secondary | ICD-10-CM | POA: Diagnosis present

## 2016-01-01 DIAGNOSIS — Z6835 Body mass index (BMI) 35.0-35.9, adult: Secondary | ICD-10-CM

## 2016-01-01 DIAGNOSIS — I10 Essential (primary) hypertension: Secondary | ICD-10-CM | POA: Diagnosis present

## 2016-01-01 DIAGNOSIS — R079 Chest pain, unspecified: Secondary | ICD-10-CM | POA: Diagnosis not present

## 2016-01-01 DIAGNOSIS — R109 Unspecified abdominal pain: Secondary | ICD-10-CM | POA: Diagnosis present

## 2016-01-01 DIAGNOSIS — R519 Headache, unspecified: Secondary | ICD-10-CM | POA: Diagnosis present

## 2016-01-01 DIAGNOSIS — M545 Low back pain: Secondary | ICD-10-CM | POA: Diagnosis present

## 2016-01-01 DIAGNOSIS — Z8249 Family history of ischemic heart disease and other diseases of the circulatory system: Secondary | ICD-10-CM

## 2016-01-01 DIAGNOSIS — R51 Headache: Secondary | ICD-10-CM | POA: Diagnosis not present

## 2016-01-01 DIAGNOSIS — G8929 Other chronic pain: Secondary | ICD-10-CM | POA: Diagnosis present

## 2016-01-01 HISTORY — DX: Unspecified chronic bronchitis: J42

## 2016-01-01 HISTORY — DX: Low back pain: M54.5

## 2016-01-01 HISTORY — DX: Pneumonia, unspecified organism: J18.9

## 2016-01-01 HISTORY — DX: Other chronic pain: G89.29

## 2016-01-01 HISTORY — DX: Low back pain, unspecified: M54.50

## 2016-01-01 LAB — BASIC METABOLIC PANEL
ANION GAP: 10 (ref 5–15)
BUN: 12 mg/dL (ref 6–20)
CALCIUM: 9.1 mg/dL (ref 8.9–10.3)
CHLORIDE: 107 mmol/L (ref 101–111)
CO2: 21 mmol/L — AB (ref 22–32)
Creatinine, Ser: 0.75 mg/dL (ref 0.44–1.00)
GFR calc non Af Amer: >60 mL/min (ref >60)
Glucose, Bld: 93 mg/dL (ref 65–99)
POTASSIUM: 3.7 mmol/L (ref 3.5–5.1)
Sodium: 138 mmol/L (ref 135–145)

## 2016-01-01 LAB — CBC
HEMATOCRIT: 40.9 % (ref 36.0–46.0)
HEMOGLOBIN: 13.5 g/dL (ref 12.0–15.0)
MCH: 28.8 pg (ref 26.0–34.0)
MCHC: 33 g/dL (ref 30.0–36.0)
MCV: 87.4 fL (ref 78.0–100.0)
Platelets: 355 10*3/uL (ref 150–400)
RBC: 4.68 MIL/uL (ref 3.87–5.11)
RDW: 13.5 % (ref 11.5–15.5)
WBC: 5.6 10*3/uL (ref 4.0–10.5)

## 2016-01-01 LAB — I-STAT TROPONIN, ED
TROPONIN I, POC: 0 ng/mL (ref 0.00–0.08)
Troponin i, poc: 0 ng/mL (ref 0.00–0.08)

## 2016-01-01 LAB — TSH: TSH: 1.172 u[IU]/mL (ref 0.350–4.500)

## 2016-01-01 LAB — TROPONIN I: Troponin I: <0.03 ng/mL (ref <0.031)

## 2016-01-01 MED ORDER — HYDROCODONE-ACETAMINOPHEN 5-325 MG PO TABS
1.0000 | ORAL_TABLET | Freq: Four times a day (QID) | ORAL | Status: DC | PRN
  Administered 2016-01-02 – 2016-01-08 (×2): 1 via ORAL
  Filled 2016-01-01 (×4): qty 1

## 2016-01-01 MED ORDER — GI COCKTAIL ~~LOC~~: 30.0000 mL | Freq: Four times a day (QID) | ORAL | Status: DC | PRN

## 2016-01-01 MED ORDER — KETOROLAC TROMETHAMINE 30 MG/ML IJ SOLN
30.0000 mg | Freq: Once | INTRAMUSCULAR | Status: AC
  Administered 2016-01-01: 30 mg via INTRAVENOUS
  Filled 2016-01-01: qty 1

## 2016-01-01 MED ORDER — ENOXAPARIN SODIUM 40 MG/0.4ML ~~LOC~~ SOLN
40.0000 mg | SUBCUTANEOUS | Status: DC
  Administered 2016-01-01: 40 mg via SUBCUTANEOUS
  Filled 2016-01-01: qty 0.4

## 2016-01-01 MED ORDER — ONDANSETRON HCL 4 MG/2ML IJ SOLN
4.0000 mg | Freq: Four times a day (QID) | INTRAMUSCULAR | Status: DC | PRN
  Administered 2016-01-05 – 2016-01-08 (×10): 4 mg via INTRAVENOUS
  Filled 2016-01-01 (×10): qty 2

## 2016-01-01 MED ORDER — TOPIRAMATE 25 MG PO TABS
25.0000 mg | ORAL_TABLET | Freq: Every day | ORAL | Status: DC
  Administered 2016-01-01 – 2016-01-08 (×8): 25 mg via ORAL
  Filled 2016-01-01 (×17): qty 1

## 2016-01-01 MED ORDER — PANTOPRAZOLE SODIUM 40 MG PO TBEC
40.0000 mg | DELAYED_RELEASE_TABLET | Freq: Every day | ORAL | Status: DC
  Administered 2016-01-01 – 2016-01-08 (×8): 40 mg via ORAL
  Filled 2016-01-01 (×8): qty 1

## 2016-01-01 MED ORDER — ASPIRIN 325 MG PO TABS: 325.0000 mg | ORAL_TABLET | Freq: Four times a day (QID) | ORAL | Status: DC | PRN

## 2016-01-01 MED ORDER — SODIUM CHLORIDE 0.9 % IV BOLUS (SEPSIS)
1000.0000 mL | Freq: Once | INTRAVENOUS | Status: AC
  Administered 2016-01-01: 1000 mL via INTRAVENOUS

## 2016-01-01 MED ORDER — AMLODIPINE BESYLATE 5 MG PO TABS
5.0000 mg | ORAL_TABLET | Freq: Every day | ORAL | Status: DC
  Administered 2016-01-02 – 2016-01-07 (×6): 5 mg via ORAL
  Filled 2016-01-01 (×6): qty 1

## 2016-01-01 MED ORDER — ACETAZOLAMIDE 250 MG PO TABS
250.0000 mg | ORAL_TABLET | Freq: Once | ORAL | Status: AC
  Administered 2016-01-01: 250 mg via ORAL
  Filled 2016-01-01 (×2): qty 1

## 2016-01-01 MED ORDER — ALPRAZOLAM 0.5 MG PO TABS
1.0000 mg | ORAL_TABLET | Freq: Four times a day (QID) | ORAL | Status: DC | PRN
  Administered 2016-01-05 – 2016-01-08 (×5): 1 mg via ORAL
  Filled 2016-01-01 (×5): qty 2

## 2016-01-01 MED ORDER — ACETAMINOPHEN 325 MG PO TABS
650.0000 mg | ORAL_TABLET | ORAL | Status: DC | PRN
  Administered 2016-01-03: 650 mg via ORAL
  Filled 2016-01-01: qty 2

## 2016-01-01 MED ORDER — NITROGLYCERIN 0.4 MG SL SUBL
0.4000 mg | SUBLINGUAL_TABLET | SUBLINGUAL | Status: DC | PRN
Start: 2016-01-01 — End: 2016-01-08

## 2016-01-01 MED ORDER — SODIUM CHLORIDE 0.9 % IV SOLN: INTRAVENOUS | Status: DC

## 2016-01-01 MED ORDER — PROCHLORPERAZINE EDISYLATE 5 MG/ML IJ SOLN
10.0000 mg | Freq: Once | INTRAMUSCULAR | Status: AC
  Administered 2016-01-01: 10 mg via INTRAVENOUS
  Filled 2016-01-01: qty 2

## 2016-01-01 MED ORDER — ALBUTEROL SULFATE (2.5 MG/3ML) 0.083% IN NEBU: 2.5000 mg | INHALATION_SOLUTION | Freq: Four times a day (QID) | RESPIRATORY_TRACT | Status: DC | PRN

## 2016-01-01 MED ORDER — DIPHENHYDRAMINE HCL 50 MG/ML IJ SOLN
25.0000 mg | Freq: Once | INTRAMUSCULAR | Status: AC
  Administered 2016-01-01: 25 mg via INTRAVENOUS
  Filled 2016-01-01: qty 1

## 2016-01-01 NOTE — H&P (Signed)
[facility] History and Physical                                                                                    [name], is a 40 y.o. female  MRN: [id number]   DOB - August 09, 1976  Admit Date - 01/01/2016  Outpatient Primary MD for the patient is [name], MD  Referring MD: Billy Fischer / ER  PMH: Past Medical History  Diagnosis Date  . Pseudotumor cerebri   . Asthma   . GERD (gastroesophageal reflux disease)   . Hypertension   . Chronic back pain   . Chronic constipation   . Chronic abdominal pain   . Migraine       PSH: Past Surgical History  Procedure Laterality Date  . Cholecystectomy    . Achilles tendon lengthening    . Abdominal hysterectomy    . Lumbar puncture  12/04/2011    dr. Merlene Laughter  . Colonoscopy  2008    Dr. Gala Romney: friable anal canal, otherwise normal  . Esophagogastroduodenoscopy  2009    Dr. Gala Romney: normal      CC:  Chief Complaint  Patient presents with  . Chest Pain  . Shortness of Breath     HPI: 40 year old female patient with history of obesity, pseudotumor cerebri sometimes not compliant with Diamox, asthma, GERD, frequent headaches as well as chronic low back pain, hypertension and recent outpatient evaluation for rectal bleeding gastroenterology who presents to the ER complaints of chest pain associated with shortness of breath. Patient tells me that she has been having ongoing chest discomfort for several weeks that begins as pain in the left breast during which she feels that she can't catch her breath. She has also noticed an increase in dyspnea while walking (especially if carrying an object) and she also becomes dizzy during chest pain episodes. She reports episodes of shortness of breath when supine and when seated. Other than dizziness she is not having any other associated symptoms such as palpitations, diaphoresis. Prior to seeking treatment in the ER she took an aspirin. She is not received any other treatment for chest  pain and describes her chest pain as being 9-1/2 out of 10. She is also complaining of blurry vision which she states occurs when she does not take her Diamox regularly. Of note patient has been evaluated on several occasions this year in the ER as follows: In February for back pain, on 3/19 for chest pain with enzymes negative times serial collections, and lastly on 3/21 for rectal bleeding. Since that evaluation for rectal bleeding she has been evaluated by gastroenterology as an outpatient who suspects the patient either has hemorrhoids or rectal fissures (please see outpatient notes 3/22)  ER Evaluation and treatment: Afebrile-BP initially was 147/100 with repeat down to 125/78, pulse 77 and respirations 18-room air saturations 100% EKG: Sinus rhythm with ventricular rate 81 bpm, QTC 490 ms, no ischemic changes observed PCXR: No active disease Lab data: Na 138, K 3.7, BUN 12, Cr 0.75, troponin 0.00, WBCs 5600, hemoglobin 13.5, platelets 355,000 Toradol 30 mg IV 1 Compazine 10 mg IV 1 Benadryl 25 mg IV 1  Review of Systems  In addition to the HPI above,  No Fever-chills, myalgias or other constitutional symptoms No Headache, new weakness, tingling, numbness in any extremity, No problems swallowing food or Liquids, indigestion/reflux No Cough, palpitations, orthopnea  No Abdominal pain, N/V; no melena or hematochezia, no dark tarry stools, Bowel movements are regular, No dysuria, hematuria or flank pain No new skin rashes, lesions, masses or bruises, No new joints pains-aches No recent weight gain or loss No polyuria, polydypsia or polyphagia,  *A full 10 point Review of Systems was done, except as stated above, all other Review of Systems were negative.  Social History Social History  Substance Use Topics  . Smoking status: Never Smoker   . Smokeless tobacco: Never Used  . Alcohol Use: 0.0 oz/week    0 Standard drinks or equivalent per week     Comment: occasionally glass of  wine     Resides at: Private residence  Lives with: Spouse  Ambulatory status: Without assistive devices   Family History Family History  Problem Relation Age of Onset  . Migraines Mother   . Cancer Maternal Uncle   . Cancer Maternal Uncle   . Cancer Paternal Grandmother   . Cancer Paternal Grandfather   . Colon cancer      unknown     Prior to Admission medications   Medication Sig Start Date End Date Taking? Authorizing Provider  albuterol (PROVENTIL HFA;VENTOLIN HFA) 108 (90 BASE) MCG/ACT inhaler Inhale 2 puffs into the lungs every 6 (six) hours as needed for wheezing or shortness of breath. Reported on 12/26/2015   Yes Historical Provider, MD  ALPRAZolam Duanne Moron) 1 MG tablet Take 1 mg by mouth 4 (four) times daily as needed for sleep or anxiety. Reported on 12/26/2015   Yes Historical Provider, MD  amLODipine (NORVASC) 5 MG tablet Take 5 mg by mouth daily. 12/29/15  Yes Historical Provider, MD  aspirin 325 MG tablet Take 325 mg by mouth every 6 (six) hours as needed for mild pain.   Yes Historical Provider, MD  aspirin-acetaminophen-caffeine (EXCEDRIN MIGRAINE) [phone] MG tablet Take 1 tablet by mouth every 6 (six) hours as needed for headache.   Yes Historical Provider, MD  Aspirin-Salicylamide-Caffeine (BC HEADACHE POWDER PO) Take 1 packet by mouth every 8 (eight) hours as needed (headache).   Yes Historical Provider, MD  HYDROcodone-acetaminophen (NORCO/VICODIN) 5-325 MG tablet Take 1 tablet by mouth every 6 (six) hours as needed. pain 12/13/15  Yes Historical Provider, MD  ibuprofen (ADVIL,MOTRIN) 800 MG tablet Take 800 mg by mouth every 8 (eight) hours as needed. pain 12/13/15  Yes Historical Provider, MD  topiramate (TOPAMAX) 25 MG tablet Take 25 mg by mouth daily. 12/31/15  Yes Historical Provider, MD  hydrocortisone (ANUSOL-HC) 2.5 % rectal cream Place 1 application rectally 2 (two) times daily. For 7 days Patient not taking: Reported on 01/01/2016 12/26/15   Orvil Feil, NP    polyethylene glycol-electrolytes (NULYTELY/GOLYTELY) 420 g solution Take 4,000 mLs by mouth once. Patient not taking: Reported on 01/01/2016 12/26/15   Orvil Feil, NP    No Known Allergies  Physical Exam  Vitals  Blood pressure 125/78, pulse 74, temperature 98.2 F (36.8 C), temperature source Oral, resp. rate 21, last menstrual period 11/11/2011, SpO2 99 %.   General:  In no acute distress, appears healthy and well nourished  Psych:  Normal affect, Denies Suicidal or Homicidal ideations, Awake Alert, Oriented X 3. Speech and thought patterns are clear and appropriate, no apparent short term memory deficits  Neuro:   No focal neurological deficits, CN II through XII intact, Strength 5/5 all 4 extremities, Sensation intact all 4 extremities.  ENT:  Ears and Eyes appear Normal, Conjunctivae clear, PER. Moist oral mucosa without erythema or exudates.  Neck:  Supple, No lymphadenopathy appreciated  Respiratory:  Symmetrical chest wall movement, Good air movement bilaterally, CTAB. Room Air  Cardiac:  RRR, No Murmurs, no LE edema noted, no JVD, No carotid bruits, peripheral pulses palpable at 2+  Abdomen:  Positive bowel sounds, Soft, Non tender, Non distended,  No masses appreciated, no obvious hepatosplenomegaly  Skin:  No Cyanosis, Normal Skin Turgor, No Skin Rash or Bruise.  Extremities: Symmetrical without obvious trauma or injury,  no effusions.  Data Review  CBC  Recent Labs Lab 01/01/16 1033  WBC 5.6  HGB 13.5  HCT 40.9  PLT 355  MCV 87.4  MCH 28.8  MCHC 33.0  RDW 13.5    Chemistries   Recent Labs Lab 01/01/16 1033  NA 138  K 3.7  CL 107  CO2 21*  GLUCOSE 93  BUN 12  CREATININE 0.75  CALCIUM 9.1    estimated creatinine clearance is 127.9 mL/min (by C-G formula based on Cr of 0.75).  No results for input(s): TSH, T4TOTAL, T3FREE, THYROIDAB in the last 72 hours.  Invalid input(s): FREET3  Coagulation profile No results for input(s): INR,  PROTIME in the last 168 hours.  No results for input(s): DDIMER in the last 72 hours.  Cardiac Enzymes No results for input(s): CKMB, TROPONINI, MYOGLOBIN in the last 168 hours.  Invalid input(s): CK  Invalid input(s): POCBNP  Urinalysis    Component Value Date/Time   COLORURINE YELLOW 11/18/2015 [place] 11/18/2015 1011   LABSPEC >1.030* 11/18/2015 1011   PHURINE 5.5 11/18/2015 1011   GLUCOSEU NEGATIVE 11/18/2015 1011   HGBUR NEGATIVE 11/18/2015 1011   BILIRUBINUR NEGATIVE 11/18/2015 1011   KETONESUR 40* 11/18/2015 1011   PROTEINUR NEGATIVE 11/18/2015 1011   UROBILINOGEN 0.2 04/05/2014 2104   NITRITE NEGATIVE 11/18/2015 1011   LEUKOCYTESUR NEGATIVE 11/18/2015 1011    Imaging results:   Ct Angio Chest Pe W/cm &/or Wo Cm  12/23/2015  CLINICAL DATA:  Dizziness and shortness of Breath EXAM: CT ANGIOGRAPHY CHEST WITH CONTRAST TECHNIQUE: Multidetector CT imaging of the chest was performed using the standard protocol during bolus administration of intravenous contrast. Multiplanar CT image reconstructions and MIPs were obtained to evaluate the vascular anatomy. CONTRAST:  115mL OMNIPAQUE IOHEXOL 350 MG/ML SOLN COMPARISON:  None. FINDINGS: Lungs are well aerated bilaterally. No focal infiltrate or sizable effusion is seen. The thoracic inlet is within normal limits. The thoracic aorta and its branches are unremarkable. The pulmonary artery shows a normal branching pattern. No filling defects to suggest pulmonary embolism are seen. No significant hilar or mediastinal adenopathy is noted. The visualized upper abdomen is within normal limits. The osseous structures show no acute abnormality. Review of the MIP images confirms the above findings. IMPRESSION: No evidence pulmonary emboli.  No acute abnormality seen. Electronically Signed   By: Inez Catalina M.D.   On: 12/23/2015 18:54   Dg Chest Portable 1 View  01/01/2016  CLINICAL DATA:  Chest pain, shortness of breath,  dizziness EXAM: PORTABLE CHEST 1 VIEW COMPARISON:  12/23/2015 FINDINGS: Cardiomediastinal silhouette is stable. No acute infiltrate or pleural effusion. No pulmonary edema. Bony thorax is unremarkable. IMPRESSION: No active disease. Electronically Signed   By: Lahoma Crocker M.D.   On: 01/01/2016 11:35  EKG: (Independently reviewed)   Sinus rhythm with ventricular rate 81 bpm, QTC 490 ms, no ischemic changes observed   Assessment & Plan  Principal Problem:   Chest pain -Chest pain has primarily atypical features and seems out of proportion to other clinical findings of completely normal EKG and normal troponin today as well as normal serial troponins one week ago -EDP spoke with Dr. Terrence Dupont who was on-call for Dr. Doylene Canard and recommendation was given to admit patient to cycle enzymes and if negative discharge home to follow up with their office for outpatient stress test. Unfortunately patient was reluctant to pursue this plan of care stating she was concerned she might "have a heart attack at home". Because of her anxiety level and repeated visits for chest pain within 2 weeks it was opted to admit the patient for observation -Telemetry/Obs -Heart score 2 (patient does have family history of CAD but in relatives over the age of 17) -Cycle troponin -Echocardiogram -I have ordered a nuclear med stress test/Lexiscan for in the morning with Dr. Terrence Dupont to read-NPO after midnight -Continue full dose aspirin -1 time sublingual nitroglycerin to see if improves chest pain symptoms -TSH -Fasting lipid panel -Continue preadmission Xanax for anxiety component   Active Problems:   HTN  -Patient reports that she does not have a history of hypertension although "I do take medicine for blood pressure" (Norvasc) -Initial presentation blood pressure was poorly controlled and will continue Norvasc    Frequent headaches/Pseudotumor cerebri -Currently reporting blurry vision which she states is typical when  she has not taken her Diamox  -Diamox was not listed in her home medications but will go ahead and give a one-time dose of 250 mg     Asthma -Currently not wheezing although patient's chest pain since occurs with activity especially with carrying objects may be related to this     Obesity, Class I, BMI 30.0-34.9 (see actual BMI) -Defer weight reduction strategies to PCP    GERD  -Was not on PPI prior to admission but it is documented patient does take NSAIDs as needed for discomfort as well as DC powders and Excedrin the chest pain could be GI in nature -GI cocktail prn    DVT Prophylaxis: Lovenox  Family Communication:   Spouse at bedside  Code Status:  Full code  Condition:  Stable  Discharge disposition: anticipate discharge post stress test back to previous,  Time spent in minutes : 60      ELLIS,ALLISON L. ANP on 01/01/2016 at 3:17 PM  You may contact me by going to [email or web address] - password [email or web address]  I am available from 7a-7p but please confirm I am on the schedule by going to Amion as above.   After 7p please contact night coverage person covering me after hours  [facility]

## 2016-01-01 NOTE — ED Notes (Signed)
Pt has been having some ongoing chest pain and SOB. sts she saw cardiologist recently and they wanted to admit her for possible blockage due to abnormal EKG. sts she tried meds instead.

## 2016-01-01 NOTE — ED Provider Notes (Signed)
CSN: [id number]     Arrival date & time 01/01/16  1009 History   First MD Initiated Contact with Patient 01/01/16 1049     Chief Complaint  Patient presents with  . Chest Pain  . Shortness of Breath   (Consider location/radiation/quality/duration/timing/severity/associated sxs/prior Treatment) HPI 40 y.o. female with a hx of HTN, presents to the Emergency Department today complaining of shortness of breath, headache, and chest pain. Pt states that she saw Dr. Doylene Canard on Friday due to chest discomfort. Dr. Doylene Canard wanted to admit for stress test, but patient refused due to her wanting to attend a wedding this past weekend. Notes ongoing CP/SOB, diaphoresis, dizziness. Chest pressure 10/10. No radiation. No OTC medication. No N/V/D. No ABD pain. Notes no hx DM, smoking, FH. Has Hx HTN. Pt also endorses 2-3 weeks of headache. Has previous diagnosis of Pseudotumor Cerebri. Has not taken Diamox and Topomax as prescribed due to running out of medication. Notes tension type headache. No N/V. Notes vision changes, but no loss of vision. No other symptoms noted       Past Medical History  Diagnosis Date  . Pseudotumor cerebri   . Asthma   . GERD (gastroesophageal reflux disease)   . Hypertension   . Chronic back pain   . Chronic constipation   . Chronic abdominal pain   . Migraine    Past Surgical History  Procedure Laterality Date  . Cholecystectomy    . Achilles tendon lengthening    . Abdominal hysterectomy    . Lumbar puncture  12/04/2011    dr. Merlene Laughter  . Colonoscopy  2008    Dr. Gala Romney: friable anal canal, otherwise normal  . Esophagogastroduodenoscopy  2009    Dr. Gala Romney: normal    Family History  Problem Relation Age of Onset  . Migraines Mother   . Cancer Maternal Uncle   . Cancer Maternal Uncle   . Cancer Paternal Grandmother   . Cancer Paternal Grandfather   . Colon cancer      unknown   Social History  Substance Use Topics  . Smoking status: Never Smoker   . Smokeless  tobacco: Never Used  . Alcohol Use: 0.0 oz/week    0 Standard drinks or equivalent per week     Comment: occasionally glass of wine    OB History    Gravida Para Term Preterm AB TAB SAB Ectopic Multiple Living   3 3 3       3      Review of Systems ROS reviewed and all are negative for acute change except as noted in the HPI.  Allergies  Review of patient's allergies indicates no known allergies.  Home Medications   Prior to Admission medications   Medication Sig Start Date End Date Taking? Authorizing Provider  albuterol (PROVENTIL HFA;VENTOLIN HFA) 108 (90 BASE) MCG/ACT inhaler Inhale 2 puffs into the lungs every 6 (six) hours as needed for wheezing or shortness of breath. Reported on 12/26/2015    Historical Provider, MD  ALPRAZolam Duanne Moron) 1 MG tablet Take 1 mg by mouth 4 (four) times daily as needed for sleep or anxiety. Reported on 12/26/2015    Historical Provider, MD  hydrocortisone (ANUSOL-HC) 2.5 % rectal cream Place 1 application rectally 2 (two) times daily. For 7 days 12/26/15   Orvil Feil, NP  polyethylene glycol-electrolytes (NULYTELY/GOLYTELY) 420 g solution Take 4,000 mLs by mouth once. 12/26/15   Orvil Feil, NP   BP 147/100 mmHg  Pulse 77  Temp(Src) 98.2  F (36.8 C) (Oral)  Resp 18  SpO2 100%  LMP 11/11/2011   Physical Exam  Constitutional: She is oriented to person, place, and time. She appears well-developed and well-nourished.  HENT:  Head: Normocephalic and atraumatic.  Eyes: EOM are normal. Pupils are equal, round, and reactive to light.  Neck: Normal range of motion. Neck supple. No tracheal deviation present.  Cardiovascular: Normal rate, regular rhythm and normal heart sounds.   No murmur heard. Pulmonary/Chest: Effort normal and breath sounds normal. No respiratory distress. She has no wheezes. She has no rales. She exhibits no tenderness.  Abdominal: Soft. There is no tenderness.  Musculoskeletal: Normal range of motion.  Neurological: She is alert  and oriented to person, place, and time. She has normal strength. No cranial nerve deficit or sensory deficit.  Cranial Nerves:  II: Pupils equal, round, reactive to light III,IV, VI: ptosis not present, extra-ocular motions intact bilaterally  V,VII: smile symmetric, facial light touch sensation equal VIII: hearing grossly normal bilaterally  IX,X: midline uvula rise  XI: bilateral shoulder shrug equal and strong XII: midline tongue extension  Skin: Skin is warm and dry.  Psychiatric: She has a normal mood and affect. Her behavior is normal. Thought content normal.  Nursing note and vitals reviewed.  ED Course  Procedures (including critical care time) Labs Review Labs Reviewed  BASIC METABOLIC PANEL - Abnormal; Notable for the following:    CO2 21 (*)    All other components within normal limits  CBC  TSH  TROPONIN I  TROPONIN I  I-STAT TROPOININ, ED  I-STAT TROPOININ, ED   Imaging Review Dg Chest Portable 1 View  01/01/2016  CLINICAL DATA:  Chest pain, shortness of breath, dizziness EXAM: PORTABLE CHEST 1 VIEW COMPARISON:  12/23/2015 FINDINGS: Cardiomediastinal silhouette is stable. No acute infiltrate or pleural effusion. No pulmonary edema. Bony thorax is unremarkable. IMPRESSION: No active disease. Electronically Signed   By: Lahoma Crocker M.D.   On: 01/01/2016 11:35   I have personally reviewed and evaluated these images and lab results as part of my medical decision-making.   EKG Interpretation   Date/Time:  Tuesday January 01 2016 10:16:25 EDT Ventricular Rate:  81 PR Interval:  170 QRS Duration: 80 QT Interval:  422 QTC Calculation: 490 R Axis:   30 Text Interpretation:  Normal sinus rhythm Septal infarct , age  undetermined Abnormal ECG No significant change since last tracing  Confirmed by [facility] MD, [place] ([id number]) on 01/01/2016 10:50:20 AM      MDM  I have reviewed and evaluated the relevant laboratory values.I have reviewed and evaluated the relevant  imaging studies.I personally evaluated and interpreted the relevant EKG.I have reviewed the relevant previous healthcare records.I have reviewed EMS Documentation.I obtained HPI from historian. Patient discussed with supervising physician  ED Course:  Assessment: Pt is a 39yF hx Pseudotumor Cerebri, presents with CP since 1-2 weeks as well as headache for the past 2-3 weeks. Risk Factors HTN. Given Fluids and Migraine cocktail in ED. Spoke with Dr. Marshall Cork due to Dr. Doylene Canard being on vacation. If Delta Trop negative, will follow up with her tomorrow. Symptoms not typical anginal presentation. Willing to schedule Stress Test as outpatient. Pt refused idea of being sent home and demanded admission in fear of having MI at home. Discussed with patient the results of Troponin and EKG and discussion with Dr. Marshall Cork, but pt adamant that she needed to stay. As for headache, pt does have hx Pseudotumor. Has not been taking Diamox  recently or Topomax as per Neurology. Pt notes headache with vision changes. No loss of vision. No CN deficits appreciated. Pt able to ambulate. No motor/sensory loss. Consulted with medicine who agreed for observation and stress test to be conducted. Will give Diamox for Pseudotumor. Pt does not meet criteria for CP protocol and a further evaluation is recommended. Pt has been re-evaluated prior to consult and VSS, NAD, heart RRR, lungs CTAB. No acute abnormalities found on EKG and delta Trop negative.    Disposition/Plan:  Admit to medicine for observation  Supervising Physician Gareth Morgan, MD   Final diagnoses:  Chest pain, unspecified chest pain type      Shary Decamp, PA-C 01/01/16 [place], MD 01/01/16 2321

## 2016-01-02 ENCOUNTER — Observation Stay (HOSPITAL_COMMUNITY): Payer: BLUE CROSS/BLUE SHIELD

## 2016-01-02 ENCOUNTER — Observation Stay (HOSPITAL_BASED_OUTPATIENT_CLINIC_OR_DEPARTMENT_OTHER): Payer: BLUE CROSS/BLUE SHIELD

## 2016-01-02 DIAGNOSIS — R079 Chest pain, unspecified: Secondary | ICD-10-CM

## 2016-01-02 DIAGNOSIS — I10 Essential (primary) hypertension: Secondary | ICD-10-CM

## 2016-01-02 DIAGNOSIS — K219 Gastro-esophageal reflux disease without esophagitis: Secondary | ICD-10-CM

## 2016-01-02 DIAGNOSIS — J4520 Mild intermittent asthma, uncomplicated: Secondary | ICD-10-CM

## 2016-01-02 DIAGNOSIS — R51 Headache: Secondary | ICD-10-CM | POA: Diagnosis not present

## 2016-01-02 LAB — BASIC METABOLIC PANEL
Anion gap: 8 (ref 5–15)
BUN: 11 mg/dL (ref 6–20)
CALCIUM: 9.1 mg/dL (ref 8.9–10.3)
CO2: 22 mmol/L (ref 22–32)
CREATININE: 0.78 mg/dL (ref 0.44–1.00)
Chloride: 108 mmol/L (ref 101–111)
GFR calc non Af Amer: >60 mL/min (ref >60)
Glucose, Bld: 134 mg/dL — ABNORMAL HIGH (ref 65–99)
Potassium: 3.4 mmol/L — ABNORMAL LOW (ref 3.5–5.1)
SODIUM: 138 mmol/L (ref 135–145)

## 2016-01-02 LAB — CBC
HCT: 42.9 % (ref 36.0–46.0)
HEMOGLOBIN: 14.3 g/dL (ref 12.0–15.0)
MCH: 29.4 pg (ref 26.0–34.0)
MCHC: 33.3 g/dL (ref 30.0–36.0)
MCV: 88.1 fL (ref 78.0–100.0)
Platelets: 368 10*3/uL (ref 150–400)
RBC: 4.87 MIL/uL (ref 3.87–5.11)
RDW: 13.6 % (ref 11.5–15.5)
WBC: 5.7 10*3/uL (ref 4.0–10.5)

## 2016-01-02 LAB — LIPID PANEL
Cholesterol: 223 mg/dL — ABNORMAL HIGH (ref 0–200)
HDL: 44 mg/dL (ref >40)
LDL CALC: 136 mg/dL — AB (ref 0–99)
Total CHOL/HDL Ratio: 5.1 RATIO
Triglycerides: 217 mg/dL — ABNORMAL HIGH (ref <150)
VLDL: 43 mg/dL — ABNORMAL HIGH (ref 0–40)

## 2016-01-02 LAB — ECHOCARDIOGRAM COMPLETE
HEIGHTINCHES: 70 in
WEIGHTICAEL: 3894.21 [oz_av]

## 2016-01-02 LAB — D-DIMER, QUANTITATIVE (NOT AT ARMC): D DIMER QUANT: 0.49 ug{FEU}/mL (ref 0.00–0.50)

## 2016-01-02 LAB — TROPONIN I

## 2016-01-02 MED ORDER — REGADENOSON 0.4 MG/5ML IV SOLN
0.4000 mg | Freq: Once | INTRAVENOUS | Status: AC
  Administered 2016-01-02: 0.4 mg via INTRAVENOUS
  Filled 2016-01-02: qty 5

## 2016-01-02 MED ORDER — ATORVASTATIN CALCIUM 40 MG PO TABS
40.0000 mg | ORAL_TABLET | Freq: Every day | ORAL | Status: DC
  Administered 2016-01-02 – 2016-01-07 (×6): 40 mg via ORAL
  Filled 2016-01-02 (×6): qty 1

## 2016-01-02 MED ORDER — REGADENOSON 0.4 MG/5ML IV SOLN
INTRAVENOUS | Status: AC
  Administered 2016-01-02: 0.4 mg via INTRAVENOUS
  Filled 2016-01-02: qty 5

## 2016-01-02 MED ORDER — TECHNETIUM TC 99M SESTAMIBI GENERIC - CARDIOLITE
10.0000 | Freq: Once | INTRAVENOUS | Status: AC | PRN
  Administered 2016-01-02: 10 via INTRAVENOUS

## 2016-01-02 MED ORDER — TECHNETIUM TC 99M SESTAMIBI - CARDIOLITE
30.0000 | Freq: Once | INTRAVENOUS | Status: AC | PRN
  Administered 2016-01-02: 30 via INTRAVENOUS

## 2016-01-02 NOTE — Progress Notes (Signed)
[facility]                                                                              Patient Demographics  [name], is a 40 y.o. female, DOB - May 24, 1976, [id number]  Admit date - 01/01/2016   Admitting Physician Waldemar Dickens, MD  Outpatient Primary MD for the patient is [name], MD  LOS -   days    Chief Complaint  Patient presents with  . Chest Pain  . Shortness of Breath       Brief HPI   40 year old female patient with history of obesity, pseudotumor cerebri sometimes not compliant with Diamox, asthma, GERD, frequent headaches as well as chronic low back pain, hypertension and recent outpatient evaluation for rectal bleeding gastroenterology who presents to the ER complaints of chest pain associated with shortness of breath. In ED, troponins negative, patient was admitted for chest pain rule out  Assessment & Plan    Principal Problem: Chest pain with atypical features - Cardiology consulted by ED, seen by Dr Terrence Dupont. - Nuclear medicine stress test today, 2-D echo results pending - Lipid panel showed cholesterol 223, LDL 136, started on Lipitor -Continue preadmission Xanax for anxiety component - D-dimer normal  Active Problems:  HTN  -Patient reports that she does not have a history of hypertension although "I do take medicine for blood pressure" (Norvasc) -Initial presentation blood pressure was poorly controlled and will continue Norvasc   Frequent headaches/Pseudotumor cerebri -Currently reporting blurry vision which she states is typical when she has not taken her Diamox  -Diamox was not listed in her home medications but will go ahead and give a one-time dose of 250 mg  - The admitting physician had discussed with patient's outpatient neurologist, Dr. Merlene Laughter, who requested to spinal tap to relieve headache during this admission. Unfortunately patient had received Lovenox, hence spinal tap cannot be done today. Hold  Lovenox - TSH 1.1   Asthma -Currently not wheezing although patient's chest pain since occurs with activity especially with carrying objects may be related to this    Obesity, Class I, BMI 30.0-34.9 (see actual BMI) -Defer weight reduction strategies to PCP   GERD  -Was not on PPI prior to admission but it is documented patient does take NSAIDs as needed for discomfort as well as DC powders and Excedrin the chest pain could be GI in nature -GI cocktail prn, add PPI   Code Status: full code  Family Communication: Discussed in detail with the patient, all imaging results, lab results explained to the patient and husband  Disposition Plan: likely DC in am after tap  Time Spent in minutes  25 minutes  Procedures  Echo Stress test   Consults   Cardiology   DVT Prophylaxis  Lovenox, will currently hold   Medications  Scheduled Meds: . amLODipine  5 mg Oral q1800  . atorvastatin  40 mg Oral q1800  . enoxaparin (LOVENOX) injection  40 mg Subcutaneous Q24H  . pantoprazole  40 mg Oral Daily  . topiramate  25 mg Oral Daily   Continuous Infusions: . sodium chloride     PRN  Meds:.acetaminophen, albuterol, ALPRAZolam, aspirin, gi cocktail, HYDROcodone-acetaminophen, nitroGLYCERIN, ondansetron (ZOFRAN) IV   Antibiotics   Anti-infectives    None        Subjective:   [name] was seen and examined today. mild chest pain at the time of my examination, no nausea, vomiting, palpitations or shortness of breath. Denies any  Patient denies abdominal pain, N/V/D/C, new weakness, numbess, tingling. No acute events overnight.    Objective:   Filed Vitals:   01/02/16 0931 01/02/16 0933 01/02/16 0935 01/02/16 0937  BP: 140/81 145/86 145/88 144/91  Pulse: 121 115 104 95  Temp:      TempSrc:      Resp:      Height:      Weight:      SpO2:        Intake/Output Summary (Last 24 hours) at 01/02/16 1412 Last data filed at 01/02/16 1100  Gross per 24 hour  Intake     720 ml  Output      0 ml  Net    720 ml     Wt Readings from Last 3 Encounters:  01/02/16 110.4 kg (243 lb 6.2 oz)  12/26/15 109.77 kg (242 lb)  12/25/15 108.863 kg (240 lb)     Exam  General: Alert and oriented x 3, NAD  HEENT:  PERRLA, EOMI, Anicteric Sclera, mucous membranes moist.   Neck: Supple, no JVD, no masses  CVS: S1 S2 auscultated, no rubs, murmurs or gallops. Regular rate and rhythm.  Respiratory: Clear to auscultation bilaterally, no wheezing, rales or rhonchi  Abdomen: Soft, nontender, nondistended, + bowel sounds  Ext: no cyanosis clubbing or edema  Neuro: AAOx3, Cr N's II- XII. Strength 5/5 upper and lower extremities bilaterally  Skin: No rashes  Psych: Normal affect and demeanor, alert and oriented x3    Data Reviewed:  I have personally reviewed following labs and imaging studies  Micro Results No results found for this or any previous visit (from the past 240 hour(s)).  Radiology Reports Ct Angio Chest Pe W/cm &/or Wo Cm  12/23/2015  CLINICAL DATA:  Dizziness and shortness of Breath EXAM: CT ANGIOGRAPHY CHEST WITH CONTRAST TECHNIQUE: Multidetector CT imaging of the chest was performed using the standard protocol during bolus administration of intravenous contrast. Multiplanar CT image reconstructions and MIPs were obtained to evaluate the vascular anatomy. CONTRAST:  148mL OMNIPAQUE IOHEXOL 350 MG/ML SOLN COMPARISON:  None. FINDINGS: Lungs are well aerated bilaterally. No focal infiltrate or sizable effusion is seen. The thoracic inlet is within normal limits. The thoracic aorta and its branches are unremarkable. The pulmonary artery shows a normal branching pattern. No filling defects to suggest pulmonary embolism are seen. No significant hilar or mediastinal adenopathy is noted. The visualized upper abdomen is within normal limits. The osseous structures show no acute abnormality. Review of the MIP images confirms the above findings. IMPRESSION: No  evidence pulmonary emboli.  No acute abnormality seen. Electronically Signed   By: Inez Catalina M.D.   On: 12/23/2015 18:54   Dg Chest Portable 1 View  01/01/2016  CLINICAL DATA:  Chest pain, shortness of breath, dizziness EXAM: PORTABLE CHEST 1 VIEW COMPARISON:  12/23/2015 FINDINGS: Cardiomediastinal silhouette is stable. No acute infiltrate or pleural effusion. No pulmonary edema. Bony thorax is unremarkable. IMPRESSION: No active disease. Electronically Signed   By: Lahoma Crocker M.D.   On: 01/01/2016 11:35    CBC  Recent Labs Lab 01/01/16 1033 01/02/16 1037  WBC 5.6 5.7  HGB 13.5  14.3  HCT 40.9 42.9  PLT 355 368  MCV 87.4 88.1  MCH 28.8 29.4  MCHC 33.0 33.3  RDW 13.5 13.6    Chemistries   Recent Labs Lab 01/01/16 1033 01/02/16 1037  NA 138 138  K 3.7 3.4*  CL 107 108  CO2 21* 22  GLUCOSE 93 134*  BUN 12 11  CREATININE 0.75 0.78  CALCIUM 9.1 9.1   ------------------------------------------------------------------------------------------------------------------ estimated creatinine clearance is 128.4 mL/min (by C-G formula based on Cr of 0.78). ------------------------------------------------------------------------------------------------------------------ No results for input(s): HGBA1C in the last 72 hours. ------------------------------------------------------------------------------------------------------------------  Recent Labs  01/02/16 0317  CHOL 223*  HDL 44  LDLCALC 136*  TRIG 217*  CHOLHDL 5.1   ------------------------------------------------------------------------------------------------------------------  Recent Labs  01/01/16 1750  TSH 1.172   ------------------------------------------------------------------------------------------------------------------ No results for input(s): VITAMINB12, FOLATE, FERRITIN, TIBC, IRON, RETICCTPCT in the last 72 hours.  Coagulation profile No results for input(s): INR, PROTIME in the last 168  hours.   Recent Labs  01/02/16 1037  DDIMER 0.49    Cardiac Enzymes  Recent Labs Lab 01/01/16 2045 01/02/16 0317 01/02/16 1037  TROPONINI <0.03 <0.03 <0.03   ------------------------------------------------------------------------------------------------------------------ Invalid input(s): POCBNP  No results for input(s): GLUCAP in the last 72 hours.   RAI,RIPUDEEP M.D. [facility] 01/02/2016, 2:12 PM  Pager: [phone] Between 7am to 7pm - call Pager - [phone]  After 7pm go to [email or web address] - password [email or web address]  Call night coverage person covering after 7pm

## 2016-01-02 NOTE — Consult Note (Signed)
Reason for Consult:Recurrent chest pain Referring Physician: [facility]  [name] is an 40 y.o. female.  [name] is 40 year old female with past medical history is significant for hypertension, pseudotumor cerebri, GERD, history of migraine headaches, questionable history of silent anteroseptal wall MI, age undetermined, came to the ER complaining of recurrent retrosternal chest pain radiating to the left side of chest, associated with shortness of breath for the last 2 weeks patient denies any exertional chest pain but complains of exertional dyspnea.denies palpitation, lightheadedness or syncope.  EKG done in the ER showed normal sinus rhythm with anteroseptal wall MI, age undetermined.  3 sets of troponin I has been negative.  Patient was seen in the nuclear medicine department during stress test.  Past Medical History  Diagnosis Date  . Pseudotumor cerebri   . GERD (gastroesophageal reflux disease)   . Hypertension   . Chronic lower back pain   . Chronic constipation   . Chronic abdominal pain   . Asthma   . Pneumonia ~ 2000 X 1  . Chronic bronchitis ([place])   . Migraine     "weekly" (01/01/2016)    Past Surgical History  Procedure Laterality Date  . Achilles tendon lengthening Left   . Lumbar puncture  12/04/2011    dr. Merlene Laughter  . Colonoscopy  2008    Dr. Gala Romney: friable anal canal, otherwise normal  . Esophagogastroduodenoscopy  2009    Dr. Gala Romney: normal   . Laparoscopic cholecystectomy    . Abdominal hysterectomy      "partial"  . Dilation and curettage of uterus      Family History  Problem Relation Age of Onset  . Migraines Mother   . Cancer Maternal Uncle   . Cancer Maternal Uncle   . Cancer Paternal Grandmother   . Cancer Paternal Grandfather   . Colon cancer      unknown    Social History:  reports that she has never smoked. She has never used smokeless tobacco. She reports that she drinks alcohol. She reports that she does not use illicit  drugs.  Allergies: No Known Allergies  Medications: I have reviewed the patient's current medications.  Results for orders placed or performed during the hospital encounter of 01/01/16 (from the past 48 hour(s))  Basic metabolic panel     Status: Abnormal   Collection Time: 01/01/16 10:33 AM  Result Value Ref Range   Sodium 138 135 - 145 mmol/L   Potassium 3.7 3.5 - 5.1 mmol/L   Chloride 107 101 - 111 mmol/L   CO2 21 (L) 22 - 32 mmol/L   Glucose, Bld 93 65 - 99 mg/dL   BUN 12 6 - 20 mg/dL   Creatinine, Ser 0.75 0.44 - 1.00 mg/dL   Calcium 9.1 8.9 - 10.3 mg/dL   GFR calc non Af Amer >60 >60 mL/min   GFR calc Af Amer >60 >60 mL/min    Comment: (NOTE) The eGFR has been calculated using the CKD EPI equation. This calculation has not been validated in all clinical situations. eGFR's persistently <60 mL/min signify possible Chronic Kidney Disease.    Anion gap 10 5 - 15  CBC     Status: None   Collection Time: 01/01/16 10:33 AM  Result Value Ref Range   WBC 5.6 4.0 - 10.5 K/uL   RBC 4.68 3.87 - 5.11 MIL/uL   Hemoglobin 13.5 12.0 - 15.0 g/dL   HCT 40.9 36.0 - 46.0 %   MCV 87.4 78.0 - 100.0 fL  MCH 28.8 26.0 - 34.0 pg   MCHC 33.0 30.0 - 36.0 g/dL   RDW 13.5 11.5 - 15.5 %   Platelets 355 150 - 400 K/uL  I-stat troponin, ED (not at [facility])     Status: None   Collection Time: 01/01/16 11:17 AM  Result Value Ref Range   Troponin i, poc 0.00 0.00 - 0.08 ng/mL   Comment 3            Comment: Due to the release kinetics of cTnI, a negative result within the first hours of the onset of symptoms does not rule out myocardial infarction with certainty. If myocardial infarction is still suspected, repeat the test at appropriate intervals.   I-Stat Troponin, ED (not at [facility])     Status: None   Collection Time: 01/01/16  2:43 PM  Result Value Ref Range   Troponin i, poc 0.00 0.00 - 0.08 ng/mL   Comment 3            Comment: Due to the release kinetics of cTnI, a negative result  within the first hours of the onset of symptoms does not rule out myocardial infarction with certainty. If myocardial infarction is still suspected, repeat the test at appropriate intervals.   TSH     Status: None   Collection Time: 01/01/16  5:50 PM  Result Value Ref Range   TSH 1.172 0.350 - 4.500 uIU/mL  Troponin I (q 6hr x 3)     Status: None   Collection Time: 01/01/16  8:45 PM  Result Value Ref Range   Troponin I <0.03 <0.031 ng/mL    Comment:        NO INDICATION OF MYOCARDIAL INJURY.   Troponin I (q 6hr x 3)     Status: None   Collection Time: 01/02/16  3:17 AM  Result Value Ref Range   Troponin I <0.03 <0.031 ng/mL    Comment:        NO INDICATION OF MYOCARDIAL INJURY.   Lipid panel     Status: Abnormal   Collection Time: 01/02/16  3:17 AM  Result Value Ref Range   Cholesterol 223 (H) 0 - 200 mg/dL   Triglycerides 217 (H) <150 mg/dL   HDL 44 >40 mg/dL   Total CHOL/HDL Ratio 5.1 RATIO   VLDL 43 (H) 0 - 40 mg/dL   LDL Cholesterol 136 (H) 0 - 99 mg/dL    Comment:        Total Cholesterol/HDL:CHD Risk Coronary Heart Disease Risk Table                     Men   Women  1/2 Average Risk   3.4   3.3  Average Risk       5.0   4.4  2 X Average Risk   9.6   7.1  3 X Average Risk  23.4   11.0        Use the calculated Patient Ratio above and the CHD Risk Table to determine the patient's CHD Risk.        ATP III CLASSIFICATION (LDL):  <100     mg/dL   Optimal  100-129  mg/dL   Near or Above                    Optimal  130-159  mg/dL   Borderline  160-189  mg/dL   High  >190     mg/dL   Very High  Dg Chest Portable 1 View  01/01/2016  CLINICAL DATA:  Chest pain, shortness of breath, dizziness EXAM: PORTABLE CHEST 1 VIEW COMPARISON:  12/23/2015 FINDINGS: Cardiomediastinal silhouette is stable. No acute infiltrate or pleural effusion. No pulmonary edema. Bony thorax is unremarkable. IMPRESSION: No active disease. Electronically Signed   By: Lahoma Crocker M.D.   On:  01/01/2016 11:35    Review of Systems  Constitutional: Negative for fever and chills.  Eyes: Negative for double vision.  Respiratory: Positive for shortness of breath.   Cardiovascular: Positive for chest pain. Negative for palpitations, orthopnea and claudication.  Gastrointestinal: Negative for nausea and vomiting.  Genitourinary: Negative for dysuria.  Neurological: Positive for headaches.   Blood pressure 144/91, pulse 95, temperature 98.2 F (36.8 C), temperature source Oral, resp. rate 19, height '5\' 10"'$  (1.778 m), weight 110.4 kg (243 lb 6.2 oz), last menstrual period 11/11/2011, SpO2 100 %. Physical Exam  Constitutional: She is oriented to person, place, and time.  HENT:  Head: Normocephalic and atraumatic.  Eyes: Conjunctivae are normal. Pupils are equal, round, and reactive to light. Left eye exhibits no discharge. No scleral icterus.  Neck: Neck supple. No JVD present. No tracheal deviation present. No thyromegaly present.  Cardiovascular: Normal rate and regular rhythm.  Gallop: soft systolic murmur noted.  No S3 gallop.   Respiratory: Effort normal and breath sounds normal.  GI: Soft. Bowel sounds are normal.  Musculoskeletal: She exhibits no edema or tenderness.  Neurological: She is alert and oriented to person, place, and time.    Assessment/Plan: Atypical chest pain, ruled out. Possible anteroseptal wall MI, age undetermined.  In the past due to abnormal EKG Hypertension. Pseudotumor cerebri. GERD. hyperlipidemia History of migraine headache. Plan Continue present management. Check nuclear stress test results Check 2-D echo Start statin as per orders Charolette Forward 01/02/2016, 10:04 AM

## 2016-01-02 NOTE — Progress Notes (Signed)
  Echocardiogram 2D Echocardiogram has been performed.  [name] 01/02/2016, 2:05 PM

## 2016-01-03 ENCOUNTER — Observation Stay (HOSPITAL_COMMUNITY): Payer: BLUE CROSS/BLUE SHIELD

## 2016-01-03 DIAGNOSIS — J4520 Mild intermittent asthma, uncomplicated: Secondary | ICD-10-CM | POA: Diagnosis not present

## 2016-01-03 DIAGNOSIS — R079 Chest pain, unspecified: Secondary | ICD-10-CM | POA: Diagnosis not present

## 2016-01-03 DIAGNOSIS — G932 Benign intracranial hypertension: Principal | ICD-10-CM

## 2016-01-03 DIAGNOSIS — K219 Gastro-esophageal reflux disease without esophagitis: Secondary | ICD-10-CM | POA: Diagnosis not present

## 2016-01-03 DIAGNOSIS — E669 Obesity, unspecified: Secondary | ICD-10-CM

## 2016-01-03 DIAGNOSIS — R51 Headache: Secondary | ICD-10-CM | POA: Diagnosis not present

## 2016-01-03 MED ORDER — ALBUTEROL SULFATE HFA 108 (90 BASE) MCG/ACT IN AERS: 2.0000 | INHALATION_SPRAY | Freq: Four times a day (QID) | RESPIRATORY_TRACT | Status: DC | PRN

## 2016-01-03 MED ORDER — HYDROMORPHONE HCL 1 MG/ML IJ SOLN
1.0000 mg | INTRAMUSCULAR | Status: DC | PRN
  Administered 2016-01-03 – 2016-01-08 (×33): 1 mg via INTRAVENOUS
  Filled 2016-01-03 (×33): qty 1

## 2016-01-03 MED ORDER — PANTOPRAZOLE SODIUM 40 MG PO TBEC: 40.0000 mg | DELAYED_RELEASE_TABLET | Freq: Every day | ORAL | Status: DC

## 2016-01-03 MED ORDER — HYDROMORPHONE HCL 1 MG/ML IJ SOLN
1.0000 mg | Freq: Once | INTRAMUSCULAR | Status: AC
  Administered 2016-01-03: 1 mg via INTRAVENOUS
  Filled 2016-01-03: qty 1

## 2016-01-03 MED ORDER — AMLODIPINE BESYLATE 5 MG PO TABS: 5.0000 mg | ORAL_TABLET | Freq: Every day | ORAL | Status: DC

## 2016-01-03 MED ORDER — HYDROCODONE-ACETAMINOPHEN 5-325 MG PO TABS: 1.0000 | ORAL_TABLET | Freq: Four times a day (QID) | ORAL | Status: DC | PRN

## 2016-01-03 MED ORDER — LIDOCAINE HCL (PF) 1 % IJ SOLN
INTRAMUSCULAR | Status: AC
  Administered 2016-01-03: 5 mL via INTRAMUSCULAR
  Filled 2016-01-03: qty 5

## 2016-01-03 MED ORDER — ATORVASTATIN CALCIUM 40 MG PO TABS: 40.0000 mg | ORAL_TABLET | Freq: Every day | ORAL | Status: DC

## 2016-01-03 NOTE — Progress Notes (Signed)
Subjective:  Doing well denies any chest pain or shortness of breath. Complains of mild headache. Nuclear stress test showed no evidence of reversible ischemia and normal EF. States schedule for GI workup as outpatient next week.  Objective:  Vital Signs in the last 24 hours: Temp:  [98.1 F (36.7 C)-98.8 F (37.1 C)] 98.1 F (36.7 C) (03/30 0457) Pulse Rate:  [68-82] 82 (03/30 0457) Resp:  [16-18] 18 (03/30 0457) BP: (110-123)/(69-83) 110/83 mmHg (03/30 0457) SpO2:  [99 %-100 %] 100 % (03/30 0457) Weight:  [110.723 kg (244 lb 1.6 oz)] 110.723 kg (244 lb 1.6 oz) (03/30 0457)  Intake/Output from previous day: 03/29 0701 - 03/30 0700 In: 960 [P.O.:960] Out: -  Intake/Output from this shift:    Physical Exam: Neck: no adenopathy, no carotid bruit, no JVD and supple, symmetrical, trachea midline Lungs: clear to auscultation bilaterally Heart: regular rate and rhythm, S1, S2 normal and Soft systolic murmur noted Abdomen: soft, non-tender; bowel sounds normal; no masses,  no organomegaly Extremities: extremities normal, atraumatic, no cyanosis or edema  Lab Results:  Recent Labs  01/01/16 1033 01/02/16 1037  WBC 5.6 5.7  HGB 13.5 14.3  PLT 355 368    Recent Labs  01/01/16 1033 01/02/16 1037  NA 138 138  K 3.7 3.4*  CL 107 108  CO2 21* 22  GLUCOSE 93 134*  BUN 12 11  CREATININE 0.75 0.78    Recent Labs  01/02/16 0317 01/02/16 1037  TROPONINI <0.03 <0.03   Hepatic Function Panel No results for input(s): PROT, ALBUMIN, AST, ALT, ALKPHOS, BILITOT, BILIDIR, IBILI in the last 72 hours.  Recent Labs  01/02/16 0317  CHOL 223*   No results for input(s): PROTIME in the last 72 hours.  Imaging: Imaging results have been reviewed and Nm Myocar Single W/spect W/wall Motion And Ef  01/02/2016  CLINICAL DATA:  Chest pain.  Shortness of breath.  Hypertension. EXAM: MYOCARDIAL IMAGING WITH SPECT (REST AND PHARMACOLOGIC-STRESS) GATED LEFT VENTRICULAR WALL MOTION STUDY  LEFT VENTRICULAR EJECTION FRACTION TECHNIQUE: Standard myocardial SPECT imaging was performed after resting intravenous injection of 10 mCi Tc-26m sestamibi. Subsequently, intravenous infusion of Lexiscan was performed under the supervision of the Cardiology staff. At peak effect of the drug, 30 mCi Tc-71m sestamibi was injected intravenously and standard myocardial SPECT imaging was performed. Quantitative gated imaging was also performed to evaluate left ventricular wall motion, and estimate left ventricular ejection fraction. COMPARISON:  None. FINDINGS: Perfusion: No decreased activity in the left ventricle on stress imaging to suggest reversible ischemia or infarction. Wall Motion: Normal left ventricular wall motion. No left ventricular dilation. Left Ventricular Ejection Fraction: 62 % End diastolic volume [id number] ml End systolic volume 42 ml IMPRESSION: 1. No reversible ischemia or infarction. 2. Normal left ventricular wall motion. 3. Left ventricular ejection fraction 62% 4. Low-risk stress test findings*. *2012 Appropriate Use Criteria for Coronary Revascularization Focused Update: J Am Coll Cardiol. [id number]. http://content.airportbarriers.com.aspx?articleid=1201161 Electronically Signed   By: Earle Gell M.D.   On: 01/02/2016 14:25   Dg Fluoro Guided Needle Plc Aspiration/injection Loc  01/03/2016  CLINICAL DATA:  Pseudotumor cerebri. Worsening headache and blurred vision. EXAM: THERAPEUTIC LUMBAR PUNCTURE UNDER FLUOROSCOPIC GUIDANCE FLUOROSCOPY TIME:  Fluoroscopy Time (in minutes and seconds): 0 minutes 36 seconds Number of Acquired Images:  1 PROCEDURE: Informed consent was obtained from the patient prior to the procedure, including potential complications of headache, allergy, and pain. With the patient prone, the lower back was prepped with Betadine. 1% Lidocaine was used  for local anesthesia. Lumbar puncture was performed at the L3-4 level using a 20 gauge needle with return of clear  CSF with an opening pressure of 26 cm water. 38 ml of CSF were obtained. No laboratory evaluation of the fluid was ordered by the ordering physician and this was done for therapeutic purposes. Closing pressure measured less than 9 cm water. The patient tolerated the procedure well and there were no apparent complications. IMPRESSION: Successful therapeutic lumbar puncture at L3-4 which removed 38 mL of clear CSF. No evidence of immediate complication. Electronically Signed   By: Earle Gell M.D.   On: 01/03/2016 10:11    Cardiac Studies:  Assessment/Plan:  Status post Atypical chest pain, ruled out. Negative nuclear stress test Abnormal EKG secondary to body habitus Hypertension. Pseudotumor cerebri. GERD. hyperlipidemia History of migraine headache.   Plan Continue present management Okay to discharge from cardiac point of view Follow-up with  Dr. Doylene Canard in 2 weeks   [name] 01/03/2016, 11:32 AM

## 2016-01-03 NOTE — Progress Notes (Signed)
Patient complaining of severe headache post LP.  Dr. Tana Coast notified

## 2016-01-03 NOTE — Discharge Summary (Signed)
Physician Discharge Summary   Patient ID: LUEVENIA COLASURDO MRN: [id number] DOB/AGE: August 15, 1976 40 y.o.  Admit date: 01/01/2016 Discharge date: 01/08/2016  Primary Care Physician:  [name], MD  Discharge Diagnoses:    . atypical Chest pain . Pseudotumor cerebriStatus post spinal tap and subsequent blood patch on the 4/3  . Frequent headaches . HTN (hypertension) . Obesity, Class I, BMI 30.0-34.9 (see actual BMI) . Asthma . GERD (gastroesophageal reflux disease) . constipation  Consults:   Cardiology Interventional radiology  Recommendations for Outpatient Follow-up:  1. Please repeat CBC/BMET at next visit   DIET: Heart healthy    Allergies:  No Known Allergies   DISCHARGE MEDICATIONS: Current Discharge Medication List    START taking these medications   Details  atorvastatin (LIPITOR) 40 MG tablet Take 1 tablet (40 mg total) by mouth at bedtime. Qty: 30 tablet, Refills: 3    docusate sodium (COLACE) 100 MG capsule Take 1 capsule (100 mg total) by mouth 2 (two) times daily. For constipation Qty: 30 capsule, Refills: 0    pantoprazole (PROTONIX) 40 MG tablet Take 1 tablet (40 mg total) by mouth daily. Qty: 30 tablet, Refills: 3    polyethylene glycol (MIRALAX / GLYCOLAX) packet Take 17 g by mouth daily as needed for mild constipation or moderate constipation. Qty: 14 each, Refills: 0      CONTINUE these medications which have CHANGED   Details  albuterol (PROVENTIL HFA;VENTOLIN HFA) 108 (90 Base) MCG/ACT inhaler Inhale 2 puffs into the lungs every 6 (six) hours as needed for wheezing or shortness of breath. Reported on 12/26/2015 Qty: 1 Inhaler, Refills: 3    amLODipine (NORVASC) 5 MG tablet Take 1 tablet (5 mg total) by mouth daily. Qty: 30 tablet, Refills: 3    HYDROcodone-acetaminophen (NORCO/VICODIN) 5-325 MG tablet Take 1 tablet by mouth every 6 (six) hours as needed for severe pain. pain Qty: 20 tablet, Refills: 0      CONTINUE these medications  which have NOT CHANGED   Details  ALPRAZolam (XANAX) 1 MG tablet Take 1 mg by mouth 4 (four) times daily as needed for sleep or anxiety. Reported on 12/26/2015    aspirin 325 MG tablet Take 325 mg by mouth every 6 (six) hours as needed for mild pain.    aspirin-acetaminophen-caffeine (EXCEDRIN MIGRAINE) 250-250-65 MG tablet Take 1 tablet by mouth every 6 (six) hours as needed for headache.    Aspirin-Salicylamide-Caffeine (BC HEADACHE POWDER PO) Take 1 packet by mouth every 8 (eight) hours as needed (headache).    topiramate (TOPAMAX) 25 MG tablet Take 25 mg by mouth daily.    polyethylene glycol-electrolytes (NULYTELY/GOLYTELY) 420 g solution Take 4,000 mLs by mouth once. Qty: 4000 mL, Refills: 0      STOP taking these medications     ibuprofen (ADVIL,MOTRIN) 800 MG tablet      hydrocortisone (ANUSOL-HC) 2.5 % rectal cream          Brief H and P: For complete details please refer to admission H and P, but in brief 40 year old female patient with history of obesity, pseudotumor cerebri sometimes not compliant with Diamox, asthma, GERD, frequent headaches as well as chronic low back pain, hypertension and recent outpatient evaluation for rectal bleeding gastroenterology who presents to the ER complaints of chest pain associated with shortness of breath. In ED, troponins negative, patient was admitted for chest pain rule out  Hospital Course:  Chest pain with atypical features likely GI/GERD - Cardiology was consulted by ED, seen by  Dr Terrence Dupont. - Patient underwent Nuclear medicine stress test which showed no reversible ischemia or infarction, EF 62%, normal left ventricular wall motion, low risk stress test - 2-D echo showed EF of 55-60%, normal wall motion, severe LVH - Lipid panel showed cholesterol 223, LDL 136, started on Lipitor - D-dimer normal    HTN  -Patient reported that she does not have a history of hypertension although "I do take medicine for blood pressure"  (Norvasc) -Initial presentation blood pressure was poorly controlled. Patient was continued on Norvasc   Frequent headaches/Pseudotumor cerebri - The admitting physician had discussed with patient's outpatient neurologist, Dr. Merlene Laughter, who requested for spinal tap to relieve headache during this admission. - Patient underwent spinal tap on 3/30, however subsequently patient started complaining of intractable headache. IR was consulted and patient was placed on strict bedrest and stay flat per instructions. Due to no significant improvement in her headache, patient underwent blood patch procedure on 01/07/16. Patient has no improvement in her headache. She will follow with her outpatient neurologist, Dr Merlene Laughter.  - TSH 1.1   Asthma -Currently not wheezing   Obesity, Class I, BMI 30.0-34.9 (see actual BMI) -Defer weight reduction strategies to PCP   GERD  -Was not on PPI prior to admission but patient takes NSAIDs, DC powders and Excedrin. Chest pain could be GI in nature - added PPI. Per patient GI outpatient workup is scheduled next week   Day of Discharge BP 119/69 mmHg  Pulse 72  Temp(Src) 98.7 F (37.1 C) (Oral)  Resp 16  Ht 5\' 10"  (1.778 m)  Wt 111.131 kg (245 lb)  BMI 35.15 kg/m2  SpO2 97%  LMP 11/11/2011  Physical Exam: General: Alert and awake oriented x3 not in any acute distress. HEENT: anicteric sclera, pupils reactive to light and accommodation CVS: S1-S2 clear no murmur rubs or gallops Chest: clear to auscultation bilaterally, no wheezing rales or rhonchi Abdomen: soft nontender, nondistended, normal bowel sounds Extremities: no cyanosis, clubbing or edema noted bilaterally Neuro: Cranial nerves II-XII intact, no focal neurological deficits   The results of significant diagnostics from this hospitalization (including imaging, microbiology, ancillary and laboratory) are listed below for reference.    LAB RESULTS: Basic Metabolic Panel:  Recent Labs Lab  01/02/16 1037  NA 138  K 3.4*  CL 108  CO2 22  GLUCOSE 134*  BUN 11  CREATININE 0.78  CALCIUM 9.1   Liver Function Tests: No results for input(s): AST, ALT, ALKPHOS, BILITOT, PROT, ALBUMIN in the last 168 hours. No results for input(s): LIPASE, AMYLASE in the last 168 hours. No results for input(s): AMMONIA in the last 168 hours. CBC:  Recent Labs Lab 01/02/16 1037  WBC 5.7  HGB 14.3  HCT 42.9  MCV 88.1  PLT 368   Cardiac Enzymes:  Recent Labs Lab 01/02/16 0317 01/02/16 1037  TROPONINI <0.03 <0.03   BNP: Invalid input(s): POCBNP CBG: No results for input(s): GLUCAP in the last 168 hours.  Significant Diagnostic Studies:  Nm Myocar Single W/spect W/wall Motion And Ef  01/02/2016  CLINICAL DATA:  Chest pain.  Shortness of breath.  Hypertension. EXAM: MYOCARDIAL IMAGING WITH SPECT (REST AND PHARMACOLOGIC-STRESS) GATED LEFT VENTRICULAR WALL MOTION STUDY LEFT VENTRICULAR EJECTION FRACTION TECHNIQUE: Standard myocardial SPECT imaging was performed after resting intravenous injection of 10 mCi Tc-37m sestamibi. Subsequently, intravenous infusion of Lexiscan was performed under the supervision of the Cardiology staff. At peak effect of the drug, 30 mCi Tc-37m sestamibi was injected intravenously and standard myocardial SPECT  imaging was performed. Quantitative gated imaging was also performed to evaluate left ventricular wall motion, and estimate left ventricular ejection fraction. COMPARISON:  None. FINDINGS: Perfusion: No decreased activity in the left ventricle on stress imaging to suggest reversible ischemia or infarction. Wall Motion: Normal left ventricular wall motion. No left ventricular dilation. Left Ventricular Ejection Fraction: 62 % End diastolic volume [id number] ml End systolic volume 42 ml IMPRESSION: 1. No reversible ischemia or infarction. 2. Normal left ventricular wall motion. 3. Left ventricular ejection fraction 62% 4. Low-risk stress test findings*. *2012 Appropriate  Use Criteria for Coronary Revascularization Focused Update: J Am Coll Cardiol. [id number]. http://content.[email or web address]?articleid=[email or web address] Electronically Signed   By: Earle Gell M.D.   On: 01/02/2016 14:25   Dg Chest Portable 1 View  01/01/2016  CLINICAL DATA:  Chest pain, shortness of breath, dizziness EXAM: PORTABLE CHEST 1 VIEW COMPARISON:  12/23/2015 FINDINGS: Cardiomediastinal silhouette is stable. No acute infiltrate or pleural effusion. No pulmonary edema. Bony thorax is unremarkable. IMPRESSION: No active disease. Electronically Signed   By: Lahoma Crocker M.D.   On: 01/01/2016 11:35    2D ECHO: Study Conclusions  - Left ventricle: The cavity size was normal. Wall thickness was  increased in a pattern of severe LVH. Systolic function was  normal. The estimated ejection fraction was in the range of 55%  to 60%. Wall motion was normal; there were no regional wall  motion abnormalities. - Atrial septum: No defect or patent foramen ovale was identified. - Pulmonary arteries: PA peak pressure: 33 mm Hg (S).   Disposition and Follow-up: Discharge Instructions    Diet - low sodium heart healthy    Complete by:  As directed      Increase activity slowly    Complete by:  As directed             DISPOSITION: home    DISCHARGE FOLLOW-UP Follow-up Information    Follow up with [name], MD. Schedule an appointment as soon as possible for a visit in 2 weeks.   Specialty:  Internal Medicine   Why:  for hospital follow-up   Contact information:   [place] [address] [phone]       Follow up with [facility], KOFI, MD. Schedule an appointment as soon as possible for a visit in 10 days.   Specialty:  Neurology   Why:  for hospital follow-up   Contact information:   [address] [place] [address] [phone]       Follow up with [facility] S, MD. Schedule an appointment as soon as possible for a visit in 2 weeks.    Specialty:  Cardiology   Why:  for hospital follow-up   Contact information:   [place] [address] [phone]        Time spent on Discharge: 65mins   Signed:   Forrestine Lecrone M.D. [facility] 01/08/2016, 12:18 PM Pager: [phone]

## 2016-01-03 NOTE — Progress Notes (Signed)
Referring Physician(s): Dr Estill Cotta  Supervising Physician: Markus Daft  Chief Complaint:  Intractable headache Pseudotumor cerebri  Subjective:  Pt has had long hx of previous Lumbar punctures Approximately yearly since 2009 per chart One blood patch performed in 2012 Most recent LP 01/03/16 - today Now with headache 10/10  Pt states headache is NOT positional Headache is same lying in bed as is standing upright (spinal headache almost always worse when stands; positional)  Pt has continued to be up in room today 4 hrs post procedure I have discussed with her our initial treatment  -------flat bedrest for 24 hrs May use restroom quickly and head up only slightly to eat; Otherwise flat and in bed    Allergies: Review of patient's allergies indicates no known allergies.  Medications: Prior to Admission medications   Medication Sig Start Date End Date Taking? Authorizing Provider  ALPRAZolam Duanne Moron) 1 MG tablet Take 1 mg by mouth 4 (four) times daily as needed for sleep or anxiety. Reported on 12/26/2015   Yes Historical Provider, MD  aspirin 325 MG tablet Take 325 mg by mouth every 6 (six) hours as needed for mild pain.   Yes Historical Provider, MD  aspirin-acetaminophen-caffeine (EXCEDRIN MIGRAINE) 709 [id number] MG tablet Take 1 tablet by mouth every 6 (six) hours as needed for headache.   Yes Historical Provider, MD  Aspirin-Salicylamide-Caffeine (BC HEADACHE POWDER PO) Take 1 packet by mouth every 8 (eight) hours as needed (headache).   Yes Historical Provider, MD  ibuprofen (ADVIL,MOTRIN) 800 MG tablet Take 800 mg by mouth every 8 (eight) hours as needed. pain 12/13/15  Yes Historical Provider, MD  topiramate (TOPAMAX) 25 MG tablet Take 25 mg by mouth daily. 12/31/15  Yes Historical Provider, MD  albuterol (PROVENTIL HFA;VENTOLIN HFA) 108 (90 Base) MCG/ACT inhaler Inhale 2 puffs into the lungs every 6 (six) hours as needed for wheezing or shortness of breath. Reported  on 12/26/2015 01/03/16   Ripudeep Krystal Eaton, MD  amLODipine (NORVASC) 5 MG tablet Take 1 tablet (5 mg total) by mouth daily. 01/03/16   Ripudeep Krystal Eaton, MD  atorvastatin (LIPITOR) 40 MG tablet Take 1 tablet (40 mg total) by mouth at bedtime. 01/03/16   Ripudeep Krystal Eaton, MD  HYDROcodone-acetaminophen (NORCO/VICODIN) 5-325 MG tablet Take 1 tablet by mouth every 6 (six) hours as needed for severe pain. pain 01/03/16   Ripudeep Krystal Eaton, MD  hydrocortisone (ANUSOL-HC) 2.5 % rectal cream Place 1 application rectally 2 (two) times daily. For 7 days Patient not taking: Reported on 01/01/2016 12/26/15   Orvil Feil, NP  pantoprazole (PROTONIX) 40 MG tablet Take 1 tablet (40 mg total) by mouth daily. 01/03/16   Ripudeep Krystal Eaton, MD  polyethylene glycol-electrolytes (NULYTELY/GOLYTELY) 420 g solution Take 4,000 mLs by mouth once. Patient not taking: Reported on 01/01/2016 12/26/15   Orvil Feil, NP     Vital Signs: BP 119/64 mmHg  Pulse 73  Temp(Src) 98 F (36.7 C) (Oral)  Resp 18  Ht 5\' 10"  (1.778 m)  Wt 244 lb 1.6 oz (110.723 kg)  BMI 35.02 kg/m2  SpO2 98%  LMP 11/11/2011  Physical Exam  Constitutional: She is oriented to person, place, and time.  Eyes: EOM are normal.  Pt almost seems as if sensitive to light  Kept closing eyes while in discussion  Cardiovascular: Normal rate.   Pulmonary/Chest: Effort normal.  Neurological: She is alert and oriented to person, place, and time.  Skin: Skin is warm and dry.  Psychiatric:  She has a normal mood and affect. Her behavior is normal. Judgment and thought content normal.  Nursing note and vitals reviewed.   Imaging: Nm Myocar Single W/spect W/wall Motion And Ef  01/02/2016  CLINICAL DATA:  Chest pain.  Shortness of breath.  Hypertension. EXAM: MYOCARDIAL IMAGING WITH SPECT (REST AND PHARMACOLOGIC-STRESS) GATED LEFT VENTRICULAR WALL MOTION STUDY LEFT VENTRICULAR EJECTION FRACTION TECHNIQUE: Standard myocardial SPECT imaging was performed after resting intravenous  injection of 10 mCi Tc-59m sestamibi. Subsequently, intravenous infusion of Lexiscan was performed under the supervision of the Cardiology staff. At peak effect of the drug, 30 mCi Tc-13m sestamibi was injected intravenously and standard myocardial SPECT imaging was performed. Quantitative gated imaging was also performed to evaluate left ventricular wall motion, and estimate left ventricular ejection fraction. COMPARISON:  None. FINDINGS: Perfusion: No decreased activity in the left ventricle on stress imaging to suggest reversible ischemia or infarction. Wall Motion: Normal left ventricular wall motion. No left ventricular dilation. Left Ventricular Ejection Fraction: 62 % End diastolic volume [id number] ml End systolic volume 42 ml IMPRESSION: 1. No reversible ischemia or infarction. 2. Normal left ventricular wall motion. 3. Left ventricular ejection fraction 62% 4. Low-risk stress test findings*. *2012 Appropriate Use Criteria for Coronary Revascularization Focused Update: J Am Coll Cardiol. [id number]. http://[email or web address]?[email or web address] Electronically Signed   By: Earle Gell M.D.   On: 01/02/2016 14:25   Dg Chest Portable 1 View  01/01/2016  CLINICAL DATA:  Chest pain, shortness of breath, dizziness EXAM: PORTABLE CHEST 1 VIEW COMPARISON:  12/23/2015 FINDINGS: Cardiomediastinal silhouette is stable. No acute infiltrate or pleural effusion. No pulmonary edema. Bony thorax is unremarkable. IMPRESSION: No active disease. Electronically Signed   By: Lahoma Crocker M.D.   On: 01/01/2016 11:35   Dg Fluoro Guided Needle Plc Aspiration/injection Loc  01/03/2016  CLINICAL DATA:  Pseudotumor cerebri. Worsening headache and blurred vision. EXAM: THERAPEUTIC LUMBAR PUNCTURE UNDER FLUOROSCOPIC GUIDANCE FLUOROSCOPY TIME:  Fluoroscopy Time (in minutes and seconds): 0 minutes 36 seconds Number of Acquired Images:  1 PROCEDURE: Informed consent was obtained from the patient prior to the procedure,  including potential complications of headache, allergy, and pain. With the patient prone, the lower back was prepped with Betadine. 1% Lidocaine was used for local anesthesia. Lumbar puncture was performed at the L3-4 level using a 20 gauge needle with return of clear CSF with an opening pressure of 26 cm water. 38 ml of CSF were obtained. No laboratory evaluation of the fluid was ordered by the ordering physician and this was done for therapeutic purposes. Closing pressure measured less than 9 cm water. The patient tolerated the procedure well and there were no apparent complications. IMPRESSION: Successful therapeutic lumbar puncture at L3-4 which removed 38 mL of clear CSF. No evidence of immediate complication. Electronically Signed   By: Earle Gell M.D.   On: 01/03/2016 10:11    Labs:  CBC:  Recent Labs  12/23/15 1655 12/25/15 1336 01/01/16 1033 01/02/16 1037  WBC 6.7 5.9 5.6 5.7  HGB 11.7* 13.1 13.5 14.3  HCT 35.6* 38.3 40.9 42.9  PLT 327 348 355 368    COAGS:  Recent Labs  04/30/15 0610  INR 1.13  APTT 27    BMP:  Recent Labs  12/23/15 1655 12/25/15 1336 01/01/16 1033 01/02/16 1037  NA 140 138 138 138  K 3.6 3.5 3.7 3.4*  CL 108 105 107 108  CO2 25 26 21* 22  GLUCOSE 112* 96 93 134*  BUN 14 11 12  11  CALCIUM 8.1* 8.6* 9.1 9.1  CREATININE 0.72 0.66 0.75 0.78  GFRNONAA >60 >60 >60 >60  GFRAA >60 >60 >60 >60    LIVER FUNCTION TESTS:  Recent Labs  04/30/15 0610 12/25/15 1336  BILITOT 0.5 0.5  AST 22 18  ALT 15 14  ALKPHOS 41 44  PROT 7.0 7.1  ALBUMIN 3.6 3.8    Assessment and Plan:  Pseudotumor cerebri Multiple LPs over the years Headache now---non-positional Flat bedrest for 24 hrs Push fluids---esp soda for 24 hrs We will recheck pt tomorrow Plan for blood patch only if meets criteria  Electronically Signed: Braelon Sprung A 01/03/2016, 3:53 PM   I spent a total of 15 Minutes at the the patient's bedside AND on the patient's hospital  floor or unit, greater than 50% of which was counseling/coordinating care for headache; pseudotumor cerebri

## 2016-01-03 NOTE — Progress Notes (Signed)
[facility]                                                                              Patient Demographics  [name], is a 40 y.o. female, DOB - 11-18-1975, [id number]  Admit date - 01/01/2016   Admitting Physician Waldemar Dickens, MD  Outpatient Primary MD for the patient is [name], MD  LOS -   days    Chief Complaint  Patient presents with  . Chest Pain  . Shortness of Breath       Brief HPI   40 year old female patient with history of obesity, pseudotumor cerebri sometimes not compliant with Diamox, asthma, GERD, frequent headaches as well as chronic low back pain, hypertension and recent outpatient evaluation for rectal bleeding gastroenterology who presents to the ER complaints of chest pain associated with shortness of breath. In ED, troponins negative, patient was admitted for chest pain rule out  Assessment & Plan    Principal Problem: Chest pain with atypical features - Cardiology was consulted by ED, seen by Dr Terrence Dupont. - Patient underwent Nuclear medicine stress test which showed no reversible ischemia or infarction, EF 62%, normal left ventricular wall motion, low risk stress test - 2-D echo showed EF of 55-60%, normal wall motion, severe LVH - Lipid panel showed cholesterol 223, LDL 136, started on Lipitor -Continue preadmission Xanax for anxiety component - D-dimer normal  Active Problems:  HTN  -Patient reports that she does not have a history of hypertension although "I do take medicine for blood pressure" (Norvasc) -Initial presentation blood pressure was poorly controlled and will continue Norvasc   Frequent headaches/Pseudotumor cerebri -  Reported blurry vision which she states is typical when she has not taken her Diamox, received diamox 250mg  x1  - The admitting physician had discussed with patient's outpatient neurologist, Dr. Merlene Laughter, who requested for spinal tap to relieve headache during this admission. -  Patient underwent spinal tap on 3/30, she will follow with Dr. Merlene Laughter outpatient. IR consulted for possible blood patch as patient is complaining of intractable HA after spinal tap not improving after multiple IV narcotics.  - TSH 1.1   Asthma -Currently not wheezing although patient's chest pain since occurs with activity especially with carrying objects may be related to this    Obesity, Class I, BMI 30.0-34.9 (see actual BMI) -Defer weight reduction strategies to PCP   GERD  -Was not on PPI prior to admission but it is documented patient does take NSAIDs as needed for discomfort as well as DC powders and Excedrin the chest pain could be GI in nature -GI cocktail prn, add PPI   Code Status: full code  Family Communication: Discussed in detail with the patient, all imaging results, lab results explained to the patient and husband  Disposition Plan:  Time Spent in minutes  25 minutes  Procedures  Echo Stress test  Spinal tap   Consults   Cardiology   DVT Prophylaxis  SCD's   Medications  Scheduled Meds: . amLODipine  5 mg Oral q1800  . atorvastatin  40 mg Oral q1800  . pantoprazole  40 mg Oral Daily  .  topiramate  25 mg Oral Daily   Continuous Infusions: . sodium chloride     PRN Meds:.acetaminophen, albuterol, ALPRAZolam, aspirin, gi cocktail, HYDROcodone-acetaminophen, HYDROmorphone (DILAUDID) injection, nitroGLYCERIN, ondansetron (ZOFRAN) IV   Antibiotics   Anti-infectives    None        Subjective:   [name] was seen and examined today. c/o intractable headache. No nausea, vomiting, palpitations or shortness of breath. Patient denies abdominal pain, N/V/D/C, new weakness, numbess, tingling. No acute events overnight.    Objective:   Filed Vitals:   01/03/16 0457 01/03/16 1000 01/03/16 1100 01/03/16 1512  BP: 110/83 120/77 111/66 119/64  Pulse: 82   73  Temp: 98.1 F (36.7 C)   98 F (36.7 C)  TempSrc: Oral   Oral  Resp: 18      Height:      Weight: 110.723 kg (244 lb 1.6 oz)     SpO2: 100%   98%    Intake/Output Summary (Last 24 hours) at 01/03/16 1534 Last data filed at 01/03/16 0000  Gross per 24 hour  Intake    480 ml  Output      0 ml  Net    480 ml     Wt Readings from Last 3 Encounters:  01/03/16 110.723 kg (244 lb 1.6 oz)  12/26/15 109.77 kg (242 lb)  12/25/15 108.863 kg (240 lb)     Exam  General: Alert and oriented x 3, NAD, uncomfortable   HEENT:    Neck:   CVS: S1 S2 clear, RRR  Respiratory: CTAB, no wheezing, rales or rhonchi  Abdomen: Soft, nontender, nondistended, + bowel sounds  Ext: no cyanosis clubbing or edema  Neuro:   Skin: No rashes  Psych: Normal affect and demeanor, alert and oriented x3    Data Reviewed:  I have personally reviewed following labs and imaging studies  Micro Results No results found for this or any previous visit (from the past 240 hour(s)).  Radiology Reports Ct Angio Chest Pe W/cm &/or Wo Cm  12/23/2015  CLINICAL DATA:  Dizziness and shortness of Breath EXAM: CT ANGIOGRAPHY CHEST WITH CONTRAST TECHNIQUE: Multidetector CT imaging of the chest was performed using the standard protocol during bolus administration of intravenous contrast. Multiplanar CT image reconstructions and MIPs were obtained to evaluate the vascular anatomy. CONTRAST:  132mL OMNIPAQUE IOHEXOL 350 MG/ML SOLN COMPARISON:  None. FINDINGS: Lungs are well aerated bilaterally. No focal infiltrate or sizable effusion is seen. The thoracic inlet is within normal limits. The thoracic aorta and its branches are unremarkable. The pulmonary artery shows a normal branching pattern. No filling defects to suggest pulmonary embolism are seen. No significant hilar or mediastinal adenopathy is noted. The visualized upper abdomen is within normal limits. The osseous structures show no acute abnormality. Review of the MIP images confirms the above findings. IMPRESSION: No evidence pulmonary emboli.   No acute abnormality seen. Electronically Signed   By: Inez Catalina M.D.   On: 12/23/2015 18:54   Nm Myocar Single W/spect W/wall Motion And Ef  01/02/2016  CLINICAL DATA:  Chest pain.  Shortness of breath.  Hypertension. EXAM: MYOCARDIAL IMAGING WITH SPECT (REST AND PHARMACOLOGIC-STRESS) GATED LEFT VENTRICULAR WALL MOTION STUDY LEFT VENTRICULAR EJECTION FRACTION TECHNIQUE: Standard myocardial SPECT imaging was performed after resting intravenous injection of 10 mCi Tc-63m sestamibi. Subsequently, intravenous infusion of Lexiscan was performed under the supervision of the Cardiology staff. At peak effect of the drug, 30 mCi Tc-62m sestamibi was injected intravenously and standard myocardial SPECT imaging  was performed. Quantitative gated imaging was also performed to evaluate left ventricular wall motion, and estimate left ventricular ejection fraction. COMPARISON:  None. FINDINGS: Perfusion: No decreased activity in the left ventricle on stress imaging to suggest reversible ischemia or infarction. Wall Motion: Normal left ventricular wall motion. No left ventricular dilation. Left Ventricular Ejection Fraction: 62 % End diastolic volume [id number] ml End systolic volume 42 ml IMPRESSION: 1. No reversible ischemia or infarction. 2. Normal left ventricular wall motion. 3. Left ventricular ejection fraction 62% 4. Low-risk stress test findings*. *2012 Appropriate Use Criteria for Coronary Revascularization Focused Update: J Am Coll Cardiol. [id number]. http://content.[email or web address]?[email or web address] Electronically Signed   By: Earle Gell M.D.   On: 01/02/2016 14:25   Dg Chest Portable 1 View  01/01/2016  CLINICAL DATA:  Chest pain, shortness of breath, dizziness EXAM: PORTABLE CHEST 1 VIEW COMPARISON:  12/23/2015 FINDINGS: Cardiomediastinal silhouette is stable. No acute infiltrate or pleural effusion. No pulmonary edema. Bony thorax is unremarkable. IMPRESSION: No active disease. Electronically  Signed   By: Lahoma Crocker M.D.   On: 01/01/2016 11:35   Dg Fluoro Guided Needle Plc Aspiration/injection Loc  01/03/2016  CLINICAL DATA:  Pseudotumor cerebri. Worsening headache and blurred vision. EXAM: THERAPEUTIC LUMBAR PUNCTURE UNDER FLUOROSCOPIC GUIDANCE FLUOROSCOPY TIME:  Fluoroscopy Time (in minutes and seconds): 0 minutes 36 seconds Number of Acquired Images:  1 PROCEDURE: Informed consent was obtained from the patient prior to the procedure, including potential complications of headache, allergy, and pain. With the patient prone, the lower back was prepped with Betadine. 1% Lidocaine was used for local anesthesia. Lumbar puncture was performed at the L3-4 level using a 20 gauge needle with return of clear CSF with an opening pressure of 26 cm water. 38 ml of CSF were obtained. No laboratory evaluation of the fluid was ordered by the ordering physician and this was done for therapeutic purposes. Closing pressure measured less than 9 cm water. The patient tolerated the procedure well and there were no apparent complications. IMPRESSION: Successful therapeutic lumbar puncture at L3-4 which removed 38 mL of clear CSF. No evidence of immediate complication. Electronically Signed   By: Earle Gell M.D.   On: 01/03/2016 10:11    CBC  Recent Labs Lab 01/01/16 1033 01/02/16 1037  WBC 5.6 5.7  HGB 13.5 14.3  HCT 40.9 42.9  PLT 355 368  MCV 87.4 88.1  MCH 28.8 29.4  MCHC 33.0 33.3  RDW 13.5 13.6    Chemistries   Recent Labs Lab 01/01/16 1033 01/02/16 1037  NA 138 138  K 3.7 3.4*  CL 107 108  CO2 21* 22  GLUCOSE 93 134*  BUN 12 11  CREATININE 0.75 0.78  CALCIUM 9.1 9.1   ------------------------------------------------------------------------------------------------------------------ estimated creatinine clearance is 128.5 mL/min (by C-G formula based on Cr of  0.78). ------------------------------------------------------------------------------------------------------------------ No results for input(s): HGBA1C in the last 72 hours. ------------------------------------------------------------------------------------------------------------------  Recent Labs  01/02/16 0317  CHOL 223*  HDL 44  LDLCALC 136*  TRIG 217*  CHOLHDL 5.1   ------------------------------------------------------------------------------------------------------------------  Recent Labs  01/01/16 1750  TSH 1.172   ------------------------------------------------------------------------------------------------------------------ No results for input(s): VITAMINB12, FOLATE, FERRITIN, TIBC, IRON, RETICCTPCT in the last 72 hours.  Coagulation profile No results for input(s): INR, PROTIME in the last 168 hours.   Recent Labs  01/02/16 1037  DDIMER 0.49    Cardiac Enzymes  Recent Labs Lab 01/01/16 2045 01/02/16 0317 01/02/16 1037  TROPONINI <0.03 <0.03 <0.03   ------------------------------------------------------------------------------------------------------------------ Invalid input(s): POCBNP  No results for input(s):  GLUCAP in the last 72 hours.   Cali Cuartas M.D. [facility] 01/03/2016, 3:34 PM  Pager: [phone] Between 7am to 7pm - call Pager - [phone]  After 7pm go to [email or web address] - password [email or web address]  Call night coverage person covering after 7pm

## 2016-01-03 NOTE — Progress Notes (Signed)
Spoke to [employer] in [place]:   ok to sit up during meals and medications as well as bathroom privileges, then to lay back down. Try to maintain laying flat, pillows ok.

## 2016-01-04 DIAGNOSIS — K219 Gastro-esophageal reflux disease without esophagitis: Secondary | ICD-10-CM | POA: Diagnosis not present

## 2016-01-04 DIAGNOSIS — R51 Headache: Secondary | ICD-10-CM | POA: Diagnosis not present

## 2016-01-04 DIAGNOSIS — R079 Chest pain, unspecified: Secondary | ICD-10-CM | POA: Diagnosis not present

## 2016-01-04 DIAGNOSIS — J4520 Mild intermittent asthma, uncomplicated: Secondary | ICD-10-CM | POA: Diagnosis not present

## 2016-01-04 MED ORDER — DOCUSATE SODIUM 100 MG PO CAPS
100.0000 mg | ORAL_CAPSULE | Freq: Two times a day (BID) | ORAL | Status: DC
  Administered 2016-01-04 – 2016-01-08 (×9): 100 mg via ORAL
  Filled 2016-01-04 (×9): qty 1

## 2016-01-04 MED ORDER — POLYETHYLENE GLYCOL 3350 17 G PO PACK
17.0000 g | PACK | Freq: Every day | ORAL | Status: DC | PRN
  Administered 2016-01-04 – 2016-01-08 (×5): 17 g via ORAL
  Filled 2016-01-04 (×6): qty 1

## 2016-01-04 NOTE — Progress Notes (Signed)
Pt pain has not been well controlled today according to the pt, her headache remain 10/10. After dilaudid is given pt does rest, but wakes up with headache. RN has educated pt on medications, the importance of remaining flat, and more. The patient states she is so stressed right now in her home and work life, and she believes that stress has brought on this episode. RN has offered emotional support, and relaxation techniques. Will continue to monitor

## 2016-01-04 NOTE — Progress Notes (Signed)
Patient ID: [name], female   DOB: [birth year], 40 y.o.   MRN: [id number]    After discussion with Dr Nelson Chimes 24 hrs is actually not enough time to give this process to heal  Recommendation: Remain flat bedrest another possibly 48 hr (Especially with some resolution in symptoms at this point) We will re evaluate pt Sunday am (4/2)  If headache resolved could be possibly discharged to home If headache continues or worsens--- Would consider blood patch at that time  We will keep on IR Radar

## 2016-01-04 NOTE — Progress Notes (Signed)
[facility]                                                                              Patient Demographics  [name], is a 40 y.o. female, DOB - 06/01/1976, [id number]  Admit date - 01/01/2016   Admitting Physician Waldemar Dickens, MD  Outpatient Primary MD for the patient is [name], MD  LOS -   days    Chief Complaint  Patient presents with  . Chest Pain  . Shortness of Breath       Brief HPI   40 year old female patient with history of obesity, pseudotumor cerebri sometimes not compliant with Diamox, asthma, GERD, frequent headaches as well as chronic low back pain, hypertension and recent outpatient evaluation for rectal bleeding gastroenterology who presents to the ER complaints of chest pain associated with shortness of breath. In ED, troponins negative, patient was admitted for chest pain rule out  Assessment & Plan    Principal Problem: Chest pain with atypical features - Cardiology was consulted by ED, seen by Dr Terrence Dupont. - Patient underwent Nuclear medicine stress test which showed no reversible ischemia or infarction, EF 62%, normal left ventricular wall motion, low risk stress test - 2-D echo showed EF of 55-60%, normal wall motion, severe LVH - Lipid panel showed cholesterol 223, LDL 136, started on Lipitor -Continue preadmission Xanax for anxiety component - D-dimer normal  Active Problems:  HTN  -Patient reports that she does not have a history of hypertension although "I do take medicine for blood pressure" (Norvasc) -Initial presentation blood pressure was poorly controlled and will continue Norvasc   Frequent headaches/Pseudotumor cerebri- headache worsened after spinal tap, not improving per patient -  Reported blurry vision which she states is typical when she has not taken her Diamox, received diamox 250mg  x1  - The admitting physician had discussed with patient's outpatient neurologist, Dr. Merlene Laughter, who requested for  spinal tap to relieve headache during this admission. - Patient underwent spinal tap on 3/30.  IR consulted for possible blood patch as patient was complaining of intractable HA after spinal tap not improving after multiple IV narcotics. IR planning blood patch on Sunday. - Patient reminded to stay strictly flat for more than 24 hours, she has been ambulating in the room for the bathroom - TSH 1.1   Asthma -Currently not wheezing although patient's chest pain since occurs with activity especially with carrying objects may be related to this    Obesity, Class I, BMI 30.0-34.9 (see actual BMI) -Defer weight reduction strategies to PCP   GERD  -Was not on PPI prior to admission but it is documented patient does take NSAIDs as needed for discomfort as well as DC powders and Excedrin the chest pain could be GI in nature -GI cocktail prn, add PPI   Code Status: full code  Family Communication: Discussed in detail with the patient, all imaging results, lab results explained to the patient and husband  Disposition Plan:  Time Spent in minutes  25 minutes  Procedures  Echo Stress test  Spinal tap   Consults   Cardiology  IR  DVT Prophylaxis  SCD's   Medications  Scheduled Meds: . amLODipine  5 mg Oral q1800  . atorvastatin  40 mg Oral q1800  . docusate sodium  100 mg Oral BID  . pantoprazole  40 mg Oral Daily  . topiramate  25 mg Oral Daily   Continuous Infusions: . sodium chloride     PRN Meds:.acetaminophen, albuterol, ALPRAZolam, aspirin, gi cocktail, HYDROcodone-acetaminophen, HYDROmorphone (DILAUDID) injection, nitroGLYCERIN, ondansetron (ZOFRAN) IV, polyethylene glycol   Antibiotics   Anti-infectives    None        Subjective:   [name] was seen and examined today. c/o intractable headache, 10/10 although ambulating in the room. No nausea, vomiting, palpitations or shortness of breath. Patient denies abdominal pain, N/V/D/C, new weakness, numbess,  tingling. No acute events overnight.    Objective:   Filed Vitals:   01/03/16 1512 01/03/16 1638 01/03/16 1947 01/04/16 0500  BP: 119/64 120/70 139/82 118/72  Pulse: 73  65 85  Temp: 98 F (36.7 C)  98 F (36.7 C) 98.8 F (37.1 C)  TempSrc: Oral  Oral Oral  Resp:   16 16  Height:      Weight:      SpO2: 98%  99% 99%   No intake or output data in the 24 hours ending 01/04/16 1247   Wt Readings from Last 3 Encounters:  01/03/16 110.723 kg (244 lb 1.6 oz)  12/26/15 109.77 kg (242 lb)  12/25/15 108.863 kg (240 lb)     Exam  General: Alert and oriented x 3, NAD, uncomfortable   HEENT:    Neck:   CVS: S1 S2 clear, RRR  Respiratory: CTAB, no wheezing, rales or rhonchi  Abdomen: Soft, nontender, nondistended, + bowel sounds  Ext: no cyanosis clubbing or edema  Neuro:   Skin: No rashes  Psych: Normal affect and demeanor, alert and oriented x3    Data Reviewed:  I have personally reviewed following labs and imaging studies  Micro Results No results found for this or any previous visit (from the past 240 hour(s)).  Radiology Reports Ct Angio Chest Pe W/cm &/or Wo Cm  12/23/2015  CLINICAL DATA:  Dizziness and shortness of Breath EXAM: CT ANGIOGRAPHY CHEST WITH CONTRAST TECHNIQUE: Multidetector CT imaging of the chest was performed using the standard protocol during bolus administration of intravenous contrast. Multiplanar CT image reconstructions and MIPs were obtained to evaluate the vascular anatomy. CONTRAST:  143mL OMNIPAQUE IOHEXOL 350 MG/ML SOLN COMPARISON:  None. FINDINGS: Lungs are well aerated bilaterally. No focal infiltrate or sizable effusion is seen. The thoracic inlet is within normal limits. The thoracic aorta and its branches are unremarkable. The pulmonary artery shows a normal branching pattern. No filling defects to suggest pulmonary embolism are seen. No significant hilar or mediastinal adenopathy is noted. The visualized upper abdomen is within  normal limits. The osseous structures show no acute abnormality. Review of the MIP images confirms the above findings. IMPRESSION: No evidence pulmonary emboli.  No acute abnormality seen. Electronically Signed   By: Inez Catalina M.D.   On: 12/23/2015 18:54   Nm Myocar Single W/spect W/wall Motion And Ef  01/02/2016  CLINICAL DATA:  Chest pain.  Shortness of breath.  Hypertension. EXAM: MYOCARDIAL IMAGING WITH SPECT (REST AND PHARMACOLOGIC-STRESS) GATED LEFT VENTRICULAR WALL MOTION STUDY LEFT VENTRICULAR EJECTION FRACTION TECHNIQUE: Standard myocardial SPECT imaging was performed after resting intravenous injection of 10 mCi Tc-48m sestamibi. Subsequently, intravenous infusion of Lexiscan was performed under the supervision of the Cardiology staff. At peak effect of  the drug, 30 mCi Tc-69m sestamibi was injected intravenously and standard myocardial SPECT imaging was performed. Quantitative gated imaging was also performed to evaluate left ventricular wall motion, and estimate left ventricular ejection fraction. COMPARISON:  None. FINDINGS: Perfusion: No decreased activity in the left ventricle on stress imaging to suggest reversible ischemia or infarction. Wall Motion: Normal left ventricular wall motion. No left ventricular dilation. Left Ventricular Ejection Fraction: 62 % End diastolic volume [id number] ml End systolic volume 42 ml IMPRESSION: 1. No reversible ischemia or infarction. 2. Normal left ventricular wall motion. 3. Left ventricular ejection fraction 62% 4. Low-risk stress test findings*. *2012 Appropriate Use Criteria for Coronary Revascularization Focused Update: J Am Coll Cardiol. [id number]. http://content.[email or web address]?articleid=[email or web address] Electronically Signed   By: Earle Gell M.D.   On: 01/02/2016 14:25   Dg Chest Portable 1 View  01/01/2016  CLINICAL DATA:  Chest pain, shortness of breath, dizziness EXAM: PORTABLE CHEST 1 VIEW COMPARISON:  12/23/2015 FINDINGS:  Cardiomediastinal silhouette is stable. No acute infiltrate or pleural effusion. No pulmonary edema. Bony thorax is unremarkable. IMPRESSION: No active disease. Electronically Signed   By: Lahoma Crocker M.D.   On: 01/01/2016 11:35   Dg Fluoro Guided Needle Plc Aspiration/injection Loc  01/03/2016  CLINICAL DATA:  Pseudotumor cerebri. Worsening headache and blurred vision. EXAM: THERAPEUTIC LUMBAR PUNCTURE UNDER FLUOROSCOPIC GUIDANCE FLUOROSCOPY TIME:  Fluoroscopy Time (in minutes and seconds): 0 minutes 36 seconds Number of Acquired Images:  1 PROCEDURE: Informed consent was obtained from the patient prior to the procedure, including potential complications of headache, allergy, and pain. With the patient prone, the lower back was prepped with Betadine. 1% Lidocaine was used for local anesthesia. Lumbar puncture was performed at the L3-4 level using a 20 gauge needle with return of clear CSF with an opening pressure of 26 cm water. 38 ml of CSF were obtained. No laboratory evaluation of the fluid was ordered by the ordering physician and this was done for therapeutic purposes. Closing pressure measured less than 9 cm water. The patient tolerated the procedure well and there were no apparent complications. IMPRESSION: Successful therapeutic lumbar puncture at L3-4 which removed 38 mL of clear CSF. No evidence of immediate complication. Electronically Signed   By: Earle Gell M.D.   On: 01/03/2016 10:11    CBC  Recent Labs Lab 01/01/16 1033 01/02/16 1037  WBC 5.6 5.7  HGB 13.5 14.3  HCT 40.9 42.9  PLT 355 368  MCV 87.4 88.1  MCH 28.8 29.4  MCHC 33.0 33.3  RDW 13.5 13.6    Chemistries   Recent Labs Lab 01/01/16 1033 01/02/16 1037  NA 138 138  K 3.7 3.4*  CL 107 108  CO2 21* 22  GLUCOSE 93 134*  BUN 12 11  CREATININE 0.75 0.78  CALCIUM 9.1 9.1   ------------------------------------------------------------------------------------------------------------------ estimated creatinine  clearance is 128.5 mL/min (by C-G formula based on Cr of 0.78). ------------------------------------------------------------------------------------------------------------------ No results for input(s): HGBA1C in the last 72 hours. ------------------------------------------------------------------------------------------------------------------  Recent Labs  01/02/16 0317  CHOL 223*  HDL 44  LDLCALC 136*  TRIG 217*  CHOLHDL 5.1   ------------------------------------------------------------------------------------------------------------------  Recent Labs  01/01/16 1750  TSH 1.172   ------------------------------------------------------------------------------------------------------------------ No results for input(s): VITAMINB12, FOLATE, FERRITIN, TIBC, IRON, RETICCTPCT in the last 72 hours.  Coagulation profile No results for input(s): INR, PROTIME in the last 168 hours.   Recent Labs  01/02/16 1037  DDIMER 0.49    Cardiac Enzymes  Recent Labs Lab 01/01/16 2045 01/02/16 0317 01/02/16 1037  TROPONINI <  0.03 <0.03 <0.03   ------------------------------------------------------------------------------------------------------------------ Invalid input(s): POCBNP  No results for input(s): GLUCAP in the last 72 hours.   Xianna Siverling M.D. [facility] 01/04/2016, 12:47 PM  Pager: [phone] Between 7am to 7pm - call Pager - [phone]  After 7pm go to [email or web address] - password [email or web address]  Call night coverage person covering after 7pm

## 2016-01-04 NOTE — Progress Notes (Signed)
Referring Physician(s): Dr Estill Cotta  Supervising Physician: Daryll Brod  Chief Complaint:  Pseudotumor cerebri Intractable headache   Subjective:  Has had many therapeutic lumbar punctures almost yearly since 2009 Commonly LP seems to help headache quickly Did get Blood patch once in 2012----which was successful in relieving headache then  LP in Rad 01/03/16 Developed worsening headache within a few hrs of procedure I evaluated pt myself yesterday afternoon She has followed orders of flat bedrest since 330 pm yesterday Pushing fluids  States headache does seem some better if still and lying in bed But any movement---rolling ; moving head; sitting up to get to restroom for just minutes     Pain worsens Denies N/V No light sensititvity    Allergies: Review of patient's allergies indicates no known allergies.  Medications: Prior to Admission medications   Medication Sig Start Date End Date Taking? Authorizing Provider  ALPRAZolam Duanne Moron) 1 MG tablet Take 1 mg by mouth 4 (four) times daily as needed for sleep or anxiety. Reported on 12/26/2015   Yes Historical Provider, MD  aspirin 325 MG tablet Take 325 mg by mouth every 6 (six) hours as needed for mild pain.   Yes Historical Provider, MD  aspirin-acetaminophen-caffeine (EXCEDRIN MIGRAINE) [phone] MG tablet Take 1 tablet by mouth every 6 (six) hours as needed for headache.   Yes Historical Provider, MD  Aspirin-Salicylamide-Caffeine (BC HEADACHE POWDER PO) Take 1 packet by mouth every 8 (eight) hours as needed (headache).   Yes Historical Provider, MD  ibuprofen (ADVIL,MOTRIN) 800 MG tablet Take 800 mg by mouth every 8 (eight) hours as needed. pain 12/13/15  Yes Historical Provider, MD  topiramate (TOPAMAX) 25 MG tablet Take 25 mg by mouth daily. 12/31/15  Yes Historical Provider, MD  albuterol (PROVENTIL HFA;VENTOLIN HFA) 108 (90 Base) MCG/ACT inhaler Inhale 2 puffs into the lungs every 6 (six) hours as needed for  wheezing or shortness of breath. Reported on 12/26/2015 01/03/16   Ripudeep Krystal Eaton, MD  amLODipine (NORVASC) 5 MG tablet Take 1 tablet (5 mg total) by mouth daily. 01/03/16   Ripudeep Krystal Eaton, MD  atorvastatin (LIPITOR) 40 MG tablet Take 1 tablet (40 mg total) by mouth at bedtime. 01/03/16   Ripudeep Krystal Eaton, MD  HYDROcodone-acetaminophen (NORCO/VICODIN) 5-325 MG tablet Take 1 tablet by mouth every 6 (six) hours as needed for severe pain. pain 01/03/16   Ripudeep Krystal Eaton, MD  hydrocortisone (ANUSOL-HC) 2.5 % rectal cream Place 1 application rectally 2 (two) times daily. For 7 days Patient not taking: Reported on 01/01/2016 12/26/15   Orvil Feil, NP  pantoprazole (PROTONIX) 40 MG tablet Take 1 tablet (40 mg total) by mouth daily. 01/03/16   Ripudeep Krystal Eaton, MD  polyethylene glycol-electrolytes (NULYTELY/GOLYTELY) 420 g solution Take 4,000 mLs by mouth once. Patient not taking: Reported on 01/01/2016 12/26/15   Orvil Feil, NP     Vital Signs: BP 118/72 mmHg  Pulse 85  Temp(Src) 98.8 F (37.1 C) (Oral)  Resp 16  Ht 5\' 10"  (1.778 m)  Wt 244 lb 1.6 oz (110.723 kg)  BMI 35.02 kg/m2  SpO2 99%  LMP 11/11/2011  Physical Exam  Constitutional: She is oriented to person, place, and time.  Eyes:  Does not seem light sensitive today  Cardiovascular: Normal rate, regular rhythm and normal heart sounds.   Pulmonary/Chest: Effort normal and breath sounds normal.  Abdominal: Soft. Bowel sounds are normal.  Musculoskeletal: Normal range of motion.  Neurological: She is alert and oriented  to person, place, and time.  Skin: Skin is warm and dry.  Psychiatric: She has a normal mood and affect. Her behavior is normal. Judgment and thought content normal.  Nursing note and vitals reviewed.   Imaging: Nm Myocar Single W/spect W/wall Motion And Ef  01/02/2016  CLINICAL DATA:  Chest pain.  Shortness of breath.  Hypertension. EXAM: MYOCARDIAL IMAGING WITH SPECT (REST AND PHARMACOLOGIC-STRESS) GATED LEFT VENTRICULAR WALL  MOTION STUDY LEFT VENTRICULAR EJECTION FRACTION TECHNIQUE: Standard myocardial SPECT imaging was performed after resting intravenous injection of 10 mCi Tc-57m sestamibi. Subsequently, intravenous infusion of Lexiscan was performed under the supervision of the Cardiology staff. At peak effect of the drug, 30 mCi Tc-40m sestamibi was injected intravenously and standard myocardial SPECT imaging was performed. Quantitative gated imaging was also performed to evaluate left ventricular wall motion, and estimate left ventricular ejection fraction. COMPARISON:  None. FINDINGS: Perfusion: No decreased activity in the left ventricle on stress imaging to suggest reversible ischemia or infarction. Wall Motion: Normal left ventricular wall motion. No left ventricular dilation. Left Ventricular Ejection Fraction: 62 % End diastolic volume [id number] ml End systolic volume 42 ml IMPRESSION: 1. No reversible ischemia or infarction. 2. Normal left ventricular wall motion. 3. Left ventricular ejection fraction 62% 4. Low-risk stress test findings*. *2012 Appropriate Use Criteria for Coronary Revascularization Focused Update: J Am Coll Cardiol. N6492421. http://[email or web address]?[email or web address] Electronically Signed   By: Earle Gell M.D.   On: 01/02/2016 14:25   Dg Chest Portable 1 View  01/01/2016  CLINICAL DATA:  Chest pain, shortness of breath, dizziness EXAM: PORTABLE CHEST 1 VIEW COMPARISON:  12/23/2015 FINDINGS: Cardiomediastinal silhouette is stable. No acute infiltrate or pleural effusion. No pulmonary edema. Bony thorax is unremarkable. IMPRESSION: No active disease. Electronically Signed   By: Lahoma Crocker M.D.   On: 01/01/2016 11:35   Dg Fluoro Guided Needle Plc Aspiration/injection Loc  01/03/2016  CLINICAL DATA:  Pseudotumor cerebri. Worsening headache and blurred vision. EXAM: THERAPEUTIC LUMBAR PUNCTURE UNDER FLUOROSCOPIC GUIDANCE FLUOROSCOPY TIME:  Fluoroscopy Time (in minutes and seconds):  0 minutes 36 seconds Number of Acquired Images:  1 PROCEDURE: Informed consent was obtained from the patient prior to the procedure, including potential complications of headache, allergy, and pain. With the patient prone, the lower back was prepped with Betadine. 1% Lidocaine was used for local anesthesia. Lumbar puncture was performed at the L3-4 level using a 20 gauge needle with return of clear CSF with an opening pressure of 26 cm water. 38 ml of CSF were obtained. No laboratory evaluation of the fluid was ordered by the ordering physician and this was done for therapeutic purposes. Closing pressure measured less than 9 cm water. The patient tolerated the procedure well and there were no apparent complications. IMPRESSION: Successful therapeutic lumbar puncture at L3-4 which removed 38 mL of clear CSF. No evidence of immediate complication. Electronically Signed   By: Earle Gell M.D.   On: 01/03/2016 10:11    Labs:  CBC:  Recent Labs  12/23/15 1655 12/25/15 1336 01/01/16 1033 01/02/16 1037  WBC 6.7 5.9 5.6 5.7  HGB 11.7* 13.1 13.5 14.3  HCT 35.6* 38.3 40.9 42.9  PLT 327 348 355 368    COAGS:  Recent Labs  04/30/15 0610  INR 1.13  APTT 27    BMP:  Recent Labs  12/23/15 1655 12/25/15 1336 01/01/16 1033 01/02/16 1037  NA 140 138 138 138  K 3.6 3.5 3.7 3.4*  CL 108 105 107 108  CO2 25 26 21*  22  GLUCOSE 112* 96 93 134*  BUN 14 11 12 11   CALCIUM 8.1* 8.6* 9.1 9.1  CREATININE 0.72 0.66 0.75 0.78  GFRNONAA >60 >60 >60 >60  GFRAA >60 >60 >60 >60    LIVER FUNCTION TESTS:  Recent Labs  04/30/15 0610 12/25/15 1336  BILITOT 0.5 0.5  AST 22 18  ALT 15 14  ALKPHOS 41 44  PROT 7.0 7.1  ALBUMIN 3.6 3.8    Assessment and Plan:  Pseudotumor cerebri LP 01/03/16 Now with worsened headache = especially with movement Request for blood patch received Will discuss with radiologist  Electronically Signed: Sukhmani Fetherolf A 01/04/2016, 11:14 AM   I spent a total of  25 Minutes at the the patient's bedside AND on the patient's hospital floor or unit, greater than 50% of which was counseling/coordinating care for spinal headache----possible blood patch

## 2016-01-05 DIAGNOSIS — I10 Essential (primary) hypertension: Secondary | ICD-10-CM | POA: Diagnosis not present

## 2016-01-05 DIAGNOSIS — G43909 Migraine, unspecified, not intractable, without status migrainosus: Secondary | ICD-10-CM | POA: Diagnosis present

## 2016-01-05 DIAGNOSIS — Z6835 Body mass index (BMI) 35.0-35.9, adult: Secondary | ICD-10-CM | POA: Diagnosis not present

## 2016-01-05 DIAGNOSIS — F419 Anxiety disorder, unspecified: Secondary | ICD-10-CM | POA: Diagnosis present

## 2016-01-05 DIAGNOSIS — R51 Headache: Secondary | ICD-10-CM | POA: Diagnosis present

## 2016-01-05 DIAGNOSIS — E669 Obesity, unspecified: Secondary | ICD-10-CM | POA: Diagnosis present

## 2016-01-05 DIAGNOSIS — J4520 Mild intermittent asthma, uncomplicated: Secondary | ICD-10-CM | POA: Diagnosis not present

## 2016-01-05 DIAGNOSIS — G8929 Other chronic pain: Secondary | ICD-10-CM | POA: Diagnosis present

## 2016-01-05 DIAGNOSIS — R109 Unspecified abdominal pain: Secondary | ICD-10-CM | POA: Diagnosis present

## 2016-01-05 DIAGNOSIS — R519 Headache, unspecified: Secondary | ICD-10-CM | POA: Diagnosis present

## 2016-01-05 DIAGNOSIS — G932 Benign intracranial hypertension: Secondary | ICD-10-CM | POA: Diagnosis present

## 2016-01-05 DIAGNOSIS — Z8249 Family history of ischemic heart disease and other diseases of the circulatory system: Secondary | ICD-10-CM | POA: Diagnosis not present

## 2016-01-05 DIAGNOSIS — K5909 Other constipation: Secondary | ICD-10-CM | POA: Diagnosis present

## 2016-01-05 DIAGNOSIS — J45909 Unspecified asthma, uncomplicated: Secondary | ICD-10-CM | POA: Diagnosis present

## 2016-01-05 DIAGNOSIS — M545 Low back pain: Secondary | ICD-10-CM | POA: Diagnosis present

## 2016-01-05 DIAGNOSIS — H538 Other visual disturbances: Secondary | ICD-10-CM | POA: Diagnosis present

## 2016-01-05 DIAGNOSIS — R079 Chest pain, unspecified: Secondary | ICD-10-CM | POA: Diagnosis not present

## 2016-01-05 DIAGNOSIS — E785 Hyperlipidemia, unspecified: Secondary | ICD-10-CM | POA: Diagnosis present

## 2016-01-05 DIAGNOSIS — K219 Gastro-esophageal reflux disease without esophagitis: Secondary | ICD-10-CM | POA: Diagnosis present

## 2016-01-05 NOTE — Progress Notes (Signed)
[facility]                                                                              Patient Demographics  [name], is a 40 y.o. female, DOB - October 16, 1975, [id number]  Admit date - 01/01/2016   Admitting Physician Waldemar Dickens, MD  Outpatient Primary MD for the patient is [name], MD  LOS -   days    Chief Complaint  Patient presents with  . Chest Pain  . Shortness of Breath       Brief HPI   40 year old female patient with history of obesity, pseudotumor cerebri sometimes not compliant with Diamox, asthma, GERD, frequent headaches as well as chronic low back pain, hypertension and recent outpatient evaluation for rectal bleeding gastroenterology who presents to the ER complaints of chest pain associated with shortness of breath. In ED, troponins negative, patient was admitted for chest pain rule out  Assessment & Plan    Principal Problem: Chest pain with atypical features - Cardiology was consulted by ED, seen by Dr Terrence Dupont. - Patient underwent Nuclear medicine stress test which showed no reversible ischemia or infarction, EF 62%, normal left ventricular wall motion, low risk stress test - 2-D echo showed EF of 55-60%, normal wall motion, severe LVH - Lipid panel showed cholesterol 223, LDL 136, started on Lipitor -Continue preadmission Xanax for anxiety component - D-dimer normal  Active Problems:  HTN  -Patient reports that she does not have a history of hypertension although "I do take medicine for blood pressure" (Norvasc) -Initial presentation blood pressure was poorly controlled and will continue Norvasc   Frequent headaches/Pseudotumor cerebri- headache worsened after spinal tap, not improving per patient -  Reported blurry vision which she states is typical when she has not taken her Diamox, received diamox 250mg  x1 on admission. The admitting physician had discussed with patient's outpatient neurologist, Dr. Merlene Laughter, who  requested for spinal tap to relieve headache during this admission. - Patient underwent spinal tap on 3/30.  IR consulted for possible blood patch as patient has been complaining of intractable HA after spinal tap not improving after multiple IV narcotics. IR planning blood patch on Sunday. - TSH 1.1   Asthma -Currently not wheezing    Obesity, Class I, BMI 30.0-34.9 (see actual BMI) -Defer weight reduction strategies to PCP   GERD  -Was not on PPI prior to admission but it is documented patient does take NSAIDs as needed for discomfort as well as DC powders and Excedrin the chest pain could be GI in nature -GI cocktail prn, add PPI   Code Status: full code  Family Communication: Discussed in detail with the patient, all imaging results, lab results explained to the patient and husband  Disposition Plan:  Time Spent in minutes  25 minutes  Procedures  Echo Stress test  Spinal tap   Consults   Cardiology  IR  DVT Prophylaxis  SCD's   Medications  Scheduled Meds: . amLODipine  5 mg Oral q1800  . atorvastatin  40 mg Oral q1800  . docusate sodium  100 mg Oral BID  . pantoprazole  40 mg Oral  Daily  . topiramate  25 mg Oral Daily   Continuous Infusions: . sodium chloride     PRN Meds:.acetaminophen, albuterol, ALPRAZolam, aspirin, gi cocktail, HYDROcodone-acetaminophen, HYDROmorphone (DILAUDID) injection, nitroGLYCERIN, ondansetron (ZOFRAN) IV, polyethylene glycol   Antibiotics   Anti-infectives    None        Subjective:   [name] was seen and examined today. Continues to complain of uncontrolled headache. States that she usually needs blood patch after spinal tap. No nausea, vomiting, palpitations or shortness of breath. Patient denies abdominal pain, N/V/D/C, new weakness, numbess, tingling. No acute events overnight.    Objective:   Filed Vitals:   01/04/16 1400 01/04/16 2014 01/05/16 0500 01/05/16 0812  BP: 120/77 121/75 118/72 123/77  Pulse:  69 69 83 71  Temp: 98.3 F (36.8 C) 98.3 F (36.8 C) 98.5 F (36.9 C) 98.7 F (37.1 C)  TempSrc: Oral Oral Oral Oral  Resp: 16 20 16 15   Height:      Weight:      SpO2: 100% 98% 100% 100%    Intake/Output Summary (Last 24 hours) at 01/05/16 1116 Last data filed at 01/05/16 1000  Gross per 24 hour  Intake   1090 ml  Output      0 ml  Net   1090 ml     Wt Readings from Last 3 Encounters:  01/03/16 110.723 kg (244 lb 1.6 oz)  12/26/15 109.77 kg (242 lb)  12/25/15 108.863 kg (240 lb)     Exam  General: Alert and oriented x 3, NAD, uncomfortable   HEENT:    Neck:   CVS: S1 S2 clear, RRR  Respiratory: CTAB, no wheezing, rales or rhonchi  Abdomen: Soft, NT, ND, NBS  Ext: no cyanosis clubbing or edema  Neuro:   Skin: No rashes  Psych: Normal affect and demeanor, alert and oriented x3    Data Reviewed:  I have personally reviewed following labs and imaging studies  Micro Results No results found for this or any previous visit (from the past 240 hour(s)).  Radiology Reports Ct Angio Chest Pe W/cm &/or Wo Cm  12/23/2015  CLINICAL DATA:  Dizziness and shortness of Breath EXAM: CT ANGIOGRAPHY CHEST WITH CONTRAST TECHNIQUE: Multidetector CT imaging of the chest was performed using the standard protocol during bolus administration of intravenous contrast. Multiplanar CT image reconstructions and MIPs were obtained to evaluate the vascular anatomy. CONTRAST:  11mL OMNIPAQUE IOHEXOL 350 MG/ML SOLN COMPARISON:  None. FINDINGS: Lungs are well aerated bilaterally. No focal infiltrate or sizable effusion is seen. The thoracic inlet is within normal limits. The thoracic aorta and its branches are unremarkable. The pulmonary artery shows a normal branching pattern. No filling defects to suggest pulmonary embolism are seen. No significant hilar or mediastinal adenopathy is noted. The visualized upper abdomen is within normal limits. The osseous structures show no acute  abnormality. Review of the MIP images confirms the above findings. IMPRESSION: No evidence pulmonary emboli.  No acute abnormality seen. Electronically Signed   By: Inez Catalina M.D.   On: 12/23/2015 18:54   Nm Myocar Single W/spect W/wall Motion And Ef  01/02/2016  CLINICAL DATA:  Chest pain.  Shortness of breath.  Hypertension. EXAM: MYOCARDIAL IMAGING WITH SPECT (REST AND PHARMACOLOGIC-STRESS) GATED LEFT VENTRICULAR WALL MOTION STUDY LEFT VENTRICULAR EJECTION FRACTION TECHNIQUE: Standard myocardial SPECT imaging was performed after resting intravenous injection of 10 mCi Tc-47m sestamibi. Subsequently, intravenous infusion of Lexiscan was performed under the supervision of the Cardiology staff. At peak effect  of the drug, 30 mCi Tc-68m sestamibi was injected intravenously and standard myocardial SPECT imaging was performed. Quantitative gated imaging was also performed to evaluate left ventricular wall motion, and estimate left ventricular ejection fraction. COMPARISON:  None. FINDINGS: Perfusion: No decreased activity in the left ventricle on stress imaging to suggest reversible ischemia or infarction. Wall Motion: Normal left ventricular wall motion. No left ventricular dilation. Left Ventricular Ejection Fraction: 62 % End diastolic volume [id number] ml End systolic volume 42 ml IMPRESSION: 1. No reversible ischemia or infarction. 2. Normal left ventricular wall motion. 3. Left ventricular ejection fraction 62% 4. Low-risk stress test findings*. *2012 Appropriate Use Criteria for Coronary Revascularization Focused Update: J Am Coll Cardiol. N6492421. http://[email or web address]?[email or web address] Electronically Signed   By: Earle Gell M.D.   On: 01/02/2016 14:25   Dg Chest Portable 1 View  01/01/2016  CLINICAL DATA:  Chest pain, shortness of breath, dizziness EXAM: PORTABLE CHEST 1 VIEW COMPARISON:  12/23/2015 FINDINGS: Cardiomediastinal silhouette is stable. No acute infiltrate or  pleural effusion. No pulmonary edema. Bony thorax is unremarkable. IMPRESSION: No active disease. Electronically Signed   By: Lahoma Crocker M.D.   On: 01/01/2016 11:35   Dg Fluoro Guided Needle Plc Aspiration/injection Loc  01/03/2016  CLINICAL DATA:  Pseudotumor cerebri. Worsening headache and blurred vision. EXAM: THERAPEUTIC LUMBAR PUNCTURE UNDER FLUOROSCOPIC GUIDANCE FLUOROSCOPY TIME:  Fluoroscopy Time (in minutes and seconds): 0 minutes 36 seconds Number of Acquired Images:  1 PROCEDURE: Informed consent was obtained from the patient prior to the procedure, including potential complications of headache, allergy, and pain. With the patient prone, the lower back was prepped with Betadine. 1% Lidocaine was used for local anesthesia. Lumbar puncture was performed at the L3-4 level using a 20 gauge needle with return of clear CSF with an opening pressure of 26 cm water. 38 ml of CSF were obtained. No laboratory evaluation of the fluid was ordered by the ordering physician and this was done for therapeutic purposes. Closing pressure measured less than 9 cm water. The patient tolerated the procedure well and there were no apparent complications. IMPRESSION: Successful therapeutic lumbar puncture at L3-4 which removed 38 mL of clear CSF. No evidence of immediate complication. Electronically Signed   By: Earle Gell M.D.   On: 01/03/2016 10:11    CBC  Recent Labs Lab 01/01/16 1033 01/02/16 1037  WBC 5.6 5.7  HGB 13.5 14.3  HCT 40.9 42.9  PLT 355 368  MCV 87.4 88.1  MCH 28.8 29.4  MCHC 33.0 33.3  RDW 13.5 13.6    Chemistries   Recent Labs Lab 01/01/16 1033 01/02/16 1037  NA 138 138  K 3.7 3.4*  CL 107 108  CO2 21* 22  GLUCOSE 93 134*  BUN 12 11  CREATININE 0.75 0.78  CALCIUM 9.1 9.1   ------------------------------------------------------------------------------------------------------------------ estimated creatinine clearance is 128.5 mL/min (by C-G formula based on Cr of  0.78). ------------------------------------------------------------------------------------------------------------------ No results for input(s): HGBA1C in the last 72 hours. ------------------------------------------------------------------------------------------------------------------ No results for input(s): CHOL, HDL, LDLCALC, TRIG, CHOLHDL, LDLDIRECT in the last 72 hours. ------------------------------------------------------------------------------------------------------------------ No results for input(s): TSH, T4TOTAL, T3FREE, THYROIDAB in the last 72 hours.  Invalid input(s): FREET3 ------------------------------------------------------------------------------------------------------------------ No results for input(s): VITAMINB12, FOLATE, FERRITIN, TIBC, IRON, RETICCTPCT in the last 72 hours.  Coagulation profile No results for input(s): INR, PROTIME in the last 168 hours.  No results for input(s): DDIMER in the last 72 hours.  Cardiac Enzymes  Recent Labs Lab 01/01/16 2045 01/02/16 0317 01/02/16 1037  TROPONINI <0.03 <0.03 <  0.03   ------------------------------------------------------------------------------------------------------------------ Invalid input(s): POCBNP  No results for input(s): GLUCAP in the last 72 hours.   RAI,RIPUDEEP M.D. [facility] 01/05/2016, 11:16 AM  Pager: [phone] Between 7am to 7pm - call Pager - [phone]  After 7pm go to [email or web address] - password [email or web address]  Call night coverage person covering after 7pm

## 2016-01-05 NOTE — Progress Notes (Signed)
Dilaudid is no longer relieving the pt headache. Pt states she will be in tears tonight. Will continue to monitor

## 2016-01-06 NOTE — Progress Notes (Signed)
Pt resting well today. Appetite has improved headache remains to be 10/10

## 2016-01-06 NOTE — Progress Notes (Signed)
Pt very upset about procedure not being completed, she states she feels that she has been "Tortured". RN has provided emotional support. PA was made aware and came to talk to the patient. Will continue to monitor

## 2016-01-06 NOTE — Progress Notes (Signed)
[facility]                                                                              Patient Demographics  [name], is a 40 y.o. female, DOB - 1976-06-19, [id number]  Admit date - 01/01/2016   Admitting Physician Waldemar Dickens, MD  Outpatient Primary MD for the patient is [name], MD  LOS -   days    Chief Complaint  Patient presents with  . Chest Pain  . Shortness of Breath       Brief HPI   40 year old female patient with history of obesity, pseudotumor cerebri sometimes not compliant with Diamox, asthma, GERD, frequent headaches as well as chronic low back pain, hypertension and recent outpatient evaluation for rectal bleeding gastroenterology who presents to the ER complaints of chest pain associated with shortness of breath. In ED, troponins negative, patient was admitted for chest pain rule out  Assessment & Plan    Principal Problem: Chest pain with atypical features - Cardiology was consulted by ED, seen by Dr Terrence Dupont. - Patient underwent Nuclear medicine stress test which showed no reversible ischemia or infarction, EF 62%, normal left ventricular wall motion, low risk stress test - 2-D echo showed EF of 55-60%, normal wall motion, severe LVH - Lipid panel showed cholesterol 223, LDL 136, started on Lipitor -Continue preadmission Xanax for anxiety component - D-dimer normal  Active Problems:  HTN  -Patient reports that she does not have a history of hypertension although "I do take medicine for blood pressure" (Norvasc) -Initial presentation blood pressure was poorly controlled and will continue Norvasc   Frequent headaches/Pseudotumor cerebri- headache worsened after spinal tap - Continues to complain of headache status post spinal tap -  Reported blurry vision which she states is typical when she has not taken her Diamox, received diamox 250mg  x1 on admission. The admitting physician had discussed with patient's outpatient  neurologist, Dr. Merlene Laughter, who requested for spinal tap to relieve headache during this admission. - Patient underwent spinal tap on 3/30. IR following for blood patch - TSH 1.1   Asthma -Currently not wheezing    Obesity, Class I, BMI 30.0-34.9 (see actual BMI) -Defer weight reduction strategies to PCP   GERD  -Was not on PPI prior to admission but it is documented patient does take NSAIDs as needed for discomfort as well as DC powders and Excedrin the chest pain could be GI in nature -GI cocktail prn, add PPI   Code Status: full code  Family Communication: Discussed in detail with the patient, all imaging results, lab results explained to the patient and husband  Disposition Plan: Patient upset about not having the blood patch procedure today  Time Spent in minutes  15 minutes  Procedures  Echo Stress test  Spinal tap   Consults   Cardiology  IR  DVT Prophylaxis  SCD's   Medications  Scheduled Meds: . amLODipine  5 mg Oral q1800  . atorvastatin  40 mg Oral q1800  . docusate sodium  100 mg Oral BID  . pantoprazole  40 mg Oral Daily  . topiramate  25 mg Oral Daily  Continuous Infusions: . sodium chloride     PRN Meds:.acetaminophen, albuterol, ALPRAZolam, aspirin, gi cocktail, HYDROcodone-acetaminophen, HYDROmorphone (DILAUDID) injection, nitroGLYCERIN, ondansetron (ZOFRAN) IV, polyethylene glycol   Antibiotics   Anti-infectives    None        Subjective:   [name] was seen and examined today. Still complaining of intractable headache, has been laying flat.  No nausea, vomiting, palpitations or shortness of breath. Patient denies abdominal pain, N/V/D/C, new weakness, numbess, tingling. No acute events overnight.    Objective:   Filed Vitals:   01/05/16 0812 01/05/16 1450 01/05/16 2100 01/06/16 0500  BP: 123/77 127/66 129/86 126/67  Pulse: 71 71 70 73  Temp: 98.7 F (37.1 C) 98.4 F (36.9 C) 98 F (36.7 C) 98.4 F (36.9 C)  TempSrc:  Oral Oral Oral Oral  Resp: 15  16 16   Height:      Weight:      SpO2: 100% 95% 100% 100%    Intake/Output Summary (Last 24 hours) at 01/06/16 1242 Last data filed at 01/06/16 0830  Gross per 24 hour  Intake    480 ml  Output      0 ml  Net    480 ml     Wt Readings from Last 3 Encounters:  01/03/16 110.723 kg (244 lb 1.6 oz)  12/26/15 109.77 kg (242 lb)  12/25/15 108.863 kg (240 lb)     Exam  General: Alert and oriented x 3, NAD, uncomfortable   HEENT:    Neck:   CVS: S1 S2 clear, RRR  Respiratory: CTAB, no wheezing, rales or rhonchi  Abdomen: Soft, NT, ND, NBS  Ext: no cyanosis clubbing or edema  Neuro:   Skin: No rashes  Psych: Normal affect and demeanor, alert and oriented x3    Data Reviewed:  I have personally reviewed following labs and imaging studies  Micro Results No results found for this or any previous visit (from the past 240 hour(s)).  Radiology Reports Ct Angio Chest Pe W/cm &/or Wo Cm  12/23/2015  CLINICAL DATA:  Dizziness and shortness of Breath EXAM: CT ANGIOGRAPHY CHEST WITH CONTRAST TECHNIQUE: Multidetector CT imaging of the chest was performed using the standard protocol during bolus administration of intravenous contrast. Multiplanar CT image reconstructions and MIPs were obtained to evaluate the vascular anatomy. CONTRAST:  112mL OMNIPAQUE IOHEXOL 350 MG/ML SOLN COMPARISON:  None. FINDINGS: Lungs are well aerated bilaterally. No focal infiltrate or sizable effusion is seen. The thoracic inlet is within normal limits. The thoracic aorta and its branches are unremarkable. The pulmonary artery shows a normal branching pattern. No filling defects to suggest pulmonary embolism are seen. No significant hilar or mediastinal adenopathy is noted. The visualized upper abdomen is within normal limits. The osseous structures show no acute abnormality. Review of the MIP images confirms the above findings. IMPRESSION: No evidence pulmonary emboli.  No  acute abnormality seen. Electronically Signed   By: Inez Catalina M.D.   On: 12/23/2015 18:54   Nm Myocar Single W/spect W/wall Motion And Ef  01/02/2016  CLINICAL DATA:  Chest pain.  Shortness of breath.  Hypertension. EXAM: MYOCARDIAL IMAGING WITH SPECT (REST AND PHARMACOLOGIC-STRESS) GATED LEFT VENTRICULAR WALL MOTION STUDY LEFT VENTRICULAR EJECTION FRACTION TECHNIQUE: Standard myocardial SPECT imaging was performed after resting intravenous injection of 10 mCi Tc-19m sestamibi. Subsequently, intravenous infusion of Lexiscan was performed under the supervision of the Cardiology staff. At peak effect of the drug, 30 mCi Tc-57m sestamibi was injected intravenously and standard myocardial SPECT imaging  was performed. Quantitative gated imaging was also performed to evaluate left ventricular wall motion, and estimate left ventricular ejection fraction. COMPARISON:  None. FINDINGS: Perfusion: No decreased activity in the left ventricle on stress imaging to suggest reversible ischemia or infarction. Wall Motion: Normal left ventricular wall motion. No left ventricular dilation. Left Ventricular Ejection Fraction: 62 % End diastolic volume [id number] ml End systolic volume 42 ml IMPRESSION: 1. No reversible ischemia or infarction. 2. Normal left ventricular wall motion. 3. Left ventricular ejection fraction 62% 4. Low-risk stress test findings*. *2012 Appropriate Use Criteria for Coronary Revascularization Focused Update: J Am Coll Cardiol. [id number]. http://content.[email or web address]?articleid=[email or web address] Electronically Signed   By: Earle Gell M.D.   On: 01/02/2016 14:25   Dg Chest Portable 1 View  01/01/2016  CLINICAL DATA:  Chest pain, shortness of breath, dizziness EXAM: PORTABLE CHEST 1 VIEW COMPARISON:  12/23/2015 FINDINGS: Cardiomediastinal silhouette is stable. No acute infiltrate or pleural effusion. No pulmonary edema. Bony thorax is unremarkable. IMPRESSION: No active disease. Electronically  Signed   By: Lahoma Crocker M.D.   On: 01/01/2016 11:35   Dg Fluoro Guided Needle Plc Aspiration/injection Loc  01/03/2016  CLINICAL DATA:  Pseudotumor cerebri. Worsening headache and blurred vision. EXAM: THERAPEUTIC LUMBAR PUNCTURE UNDER FLUOROSCOPIC GUIDANCE FLUOROSCOPY TIME:  Fluoroscopy Time (in minutes and seconds): 0 minutes 36 seconds Number of Acquired Images:  1 PROCEDURE: Informed consent was obtained from the patient prior to the procedure, including potential complications of headache, allergy, and pain. With the patient prone, the lower back was prepped with Betadine. 1% Lidocaine was used for local anesthesia. Lumbar puncture was performed at the L3-4 level using a 20 gauge needle with return of clear CSF with an opening pressure of 26 cm water. 38 ml of CSF were obtained. No laboratory evaluation of the fluid was ordered by the ordering physician and this was done for therapeutic purposes. Closing pressure measured less than 9 cm water. The patient tolerated the procedure well and there were no apparent complications. IMPRESSION: Successful therapeutic lumbar puncture at L3-4 which removed 38 mL of clear CSF. No evidence of immediate complication. Electronically Signed   By: Earle Gell M.D.   On: 01/03/2016 10:11    CBC  Recent Labs Lab 01/01/16 1033 01/02/16 1037  WBC 5.6 5.7  HGB 13.5 14.3  HCT 40.9 42.9  PLT 355 368  MCV 87.4 88.1  MCH 28.8 29.4  MCHC 33.0 33.3  RDW 13.5 13.6    Chemistries   Recent Labs Lab 01/01/16 1033 01/02/16 1037  NA 138 138  K 3.7 3.4*  CL 107 108  CO2 21* 22  GLUCOSE 93 134*  BUN 12 11  CREATININE 0.75 0.78  CALCIUM 9.1 9.1   ------------------------------------------------------------------------------------------------------------------ estimated creatinine clearance is 128.5 mL/min (by C-G formula based on Cr of  0.78). ------------------------------------------------------------------------------------------------------------------ No results for input(s): HGBA1C in the last 72 hours. ------------------------------------------------------------------------------------------------------------------ No results for input(s): CHOL, HDL, LDLCALC, TRIG, CHOLHDL, LDLDIRECT in the last 72 hours. ------------------------------------------------------------------------------------------------------------------ No results for input(s): TSH, T4TOTAL, T3FREE, THYROIDAB in the last 72 hours.  Invalid input(s): FREET3 ------------------------------------------------------------------------------------------------------------------ No results for input(s): VITAMINB12, FOLATE, FERRITIN, TIBC, IRON, RETICCTPCT in the last 72 hours.  Coagulation profile No results for input(s): INR, PROTIME in the last 168 hours.  No results for input(s): DDIMER in the last 72 hours.  Cardiac Enzymes  Recent Labs Lab 01/01/16 2045 01/02/16 0317 01/02/16 1037  TROPONINI <0.03 <0.03 <0.03   ------------------------------------------------------------------------------------------------------------------ Invalid input(s): POCBNP  No results for input(s): GLUCAP in the  last 72 hours.   [name] M.D. [facility] 01/06/2016, 12:42 PM  Pager: [phone] Between 7am to 7pm - call Pager - [phone]  After 7pm go to [email or web address] - password [email or web address]  Call night coverage person covering after 7pm

## 2016-01-06 NOTE — Progress Notes (Addendum)
Patient ID: [name], female   DOB: Oct 27, 1975, 40 y.o.   MRN: [id number] Pt currently asleep. Nurse reports pt still experiencing HA despite Dilaudid. Case d/w both IR and neuroradiologist on call. They recommend additional 24 hours of bedrest/hydration prior to performing blood patch. Will reevaluate pt on 4/3 and if HA persists plan for blood patch. Nurse aware. Plans also discussed extensively with pt/family.

## 2016-01-07 ENCOUNTER — Inpatient Hospital Stay (HOSPITAL_COMMUNITY): Payer: BLUE CROSS/BLUE SHIELD

## 2016-01-07 DIAGNOSIS — Z029 Encounter for administrative examinations, unspecified: Secondary | ICD-10-CM

## 2016-01-07 MED ORDER — LIDOCAINE HCL 1 % IJ SOLN
INTRAMUSCULAR | Status: AC
  Filled 2016-01-07: qty 20

## 2016-01-07 MED ORDER — HYDROMORPHONE HCL 1 MG/ML IJ SOLN
0.5000 mg | Freq: Once | INTRAMUSCULAR | Status: AC
  Administered 2016-01-07: 0.5 mg via INTRAVENOUS
  Filled 2016-01-07: qty 1

## 2016-01-07 MED ORDER — IOHEXOL 180 MG/ML  SOLN
20.0000 mL | Freq: Once | INTRAMUSCULAR | Status: AC | PRN
  Administered 2016-01-07: 5 mL via INTRAVENOUS

## 2016-01-07 MED ORDER — SODIUM CHLORIDE 0.9 % IJ SOLN
INTRAMUSCULAR | Status: AC
  Filled 2016-01-07: qty 10

## 2016-01-07 NOTE — Progress Notes (Signed)
[facility]                                                                              Patient Demographics  [name], is a 40 y.o. female, DOB - 01/04/1976, [id number]  Admit date - 01/01/2016   Admitting Physician Waldemar Dickens, MD  Outpatient Primary MD for the patient is [name], MD  LOS -   days    Chief Complaint  Patient presents with  . Chest Pain  . Shortness of Breath       Brief HPI   40 year old female patient with history of obesity, pseudotumor cerebri sometimes not compliant with Diamox, asthma, GERD, frequent headaches as well as chronic low back pain, hypertension and recent outpatient evaluation for rectal bleeding gastroenterology who presents to the ER complaints of chest pain associated with shortness of breath. In ED, troponins negative, patient was admitted for chest pain rule out  Assessment & Plan    Principal Problem: Chest pain with atypical features - Cardiology was consulted by ED, seen by Dr Terrence Dupont. - Patient underwent Nuclear medicine stress test which showed no reversible ischemia or infarction, EF 62%, normal left ventricular wall motion, low risk stress test - 2-D echo showed EF of 55-60%, normal wall motion, severe LVH - Lipid panel showed cholesterol 223, LDL 136, started on Lipitor -Continue preadmission Xanax for anxiety component - D-dimer normal  Active Problems:  HTN  -Patient reports that she does not have a history of hypertension although "I do take medicine for blood pressure" (Norvasc) -Initial presentation blood pressure was poorly controlled and will continue Norvasc   Frequent headaches/Pseudotumor cerebri- headache worsened after spinal tap - Continues to complain of headache status post spinal tap -  Reported blurry vision which she states is typical when she has not taken her Diamox, received diamox 250mg  x1 on admission. The admitting physician had discussed with patient's outpatient  neurologist, Dr. Merlene Laughter, who requested for spinal tap to relieve headache during this admission. - Patient underwent spinal tap on 3/30. IR following for blood patch, planned today - TSH 1.1   Asthma -Currently not wheezing    Obesity, Class I, BMI 30.0-34.9 (see actual BMI) -Defer weight reduction strategies to PCP   GERD  -Was not on PPI prior to admission but it is documented patient does take NSAIDs as needed for discomfort as well as DC powders and Excedrin the chest pain could be GI in nature -GI cocktail prn, add PPI   Code Status: full code  Family Communication: Discussed in detail with the patient, all imaging results, lab results explained to the patient and husband  Disposition Plan: Hopefully DC home tomorrow if headache is improved, planned blood patch today  Time Spent in minutes  15 minutes  Procedures  Echo Stress test  Spinal tap   Consults   Cardiology  IR  DVT Prophylaxis  SCD's   Medications  Scheduled Meds: . amLODipine  5 mg Oral q1800  . atorvastatin  40 mg Oral q1800  . docusate sodium  100 mg Oral BID  . lidocaine      . pantoprazole  40  mg Oral Daily  . sodium chloride      . topiramate  25 mg Oral Daily   Continuous Infusions: . sodium chloride     PRN Meds:.acetaminophen, albuterol, ALPRAZolam, aspirin, gi cocktail, HYDROcodone-acetaminophen, HYDROmorphone (DILAUDID) injection, nitroGLYCERIN, ondansetron (ZOFRAN) IV, polyethylene glycol   Antibiotics   Anti-infectives    None        Subjective:   [name] was seen and examined today.Complaining of headache 2000 times/10, frustrated.  No nausea, vomiting, palpitations or shortness of breath. Patient denies abdominal pain, N/V/D/C, new weakness, numbess, tingling. No acute events overnight.    Objective:   Filed Vitals:   01/06/16 0500 01/06/16 1405 01/06/16 2135 01/07/16 0519  BP: 126/67 134/81 136/88 120/69  Pulse: 73 78 70 85  Temp: 98.4 F (36.9 C) 98.4 F  (36.9 C) 98.2 F (36.8 C) 98.4 F (36.9 C)  TempSrc: Oral Oral Oral Oral  Resp: 16     Height:      Weight:    112.311 kg (247 lb 9.6 oz)  SpO2: 100% 99% 100% 99%    Intake/Output Summary (Last 24 hours) at 01/07/16 1329 Last data filed at 01/06/16 1330  Gross per 24 hour  Intake    240 ml  Output      0 ml  Net    240 ml     Wt Readings from Last 3 Encounters:  01/07/16 112.311 kg (247 lb 9.6 oz)  12/26/15 109.77 kg (242 lb)  12/25/15 108.863 kg (240 lb)     Exam  General: Alert and oriented x 3, NAD, uncomfortable   HEENT:    Neck:   CVS: S1 S2 clear, RRR  Respiratory: CTAB, no wheezing, rales or rhonchi  Abdomen: Soft, NT, ND, NBS  Ext: no cyanosis clubbing or edema  Neuro:   Skin: No rashes  Psych: Frustrated and uncomfortable   Data Reviewed:  I have personally reviewed following labs and imaging studies  Micro Results No results found for this or any previous visit (from the past 240 hour(s)).  Radiology Reports Ct Angio Chest Pe W/cm &/or Wo Cm  12/23/2015  CLINICAL DATA:  Dizziness and shortness of Breath EXAM: CT ANGIOGRAPHY CHEST WITH CONTRAST TECHNIQUE: Multidetector CT imaging of the chest was performed using the standard protocol during bolus administration of intravenous contrast. Multiplanar CT image reconstructions and MIPs were obtained to evaluate the vascular anatomy. CONTRAST:  170mL OMNIPAQUE IOHEXOL 350 MG/ML SOLN COMPARISON:  None. FINDINGS: Lungs are well aerated bilaterally. No focal infiltrate or sizable effusion is seen. The thoracic inlet is within normal limits. The thoracic aorta and its branches are unremarkable. The pulmonary artery shows a normal branching pattern. No filling defects to suggest pulmonary embolism are seen. No significant hilar or mediastinal adenopathy is noted. The visualized upper abdomen is within normal limits. The osseous structures show no acute abnormality. Review of the MIP images confirms the above  findings. IMPRESSION: No evidence pulmonary emboli.  No acute abnormality seen. Electronically Signed   By: Inez Catalina M.D.   On: 12/23/2015 18:54   Nm Myocar Single W/spect W/wall Motion And Ef  01/02/2016  CLINICAL DATA:  Chest pain.  Shortness of breath.  Hypertension. EXAM: MYOCARDIAL IMAGING WITH SPECT (REST AND PHARMACOLOGIC-STRESS) GATED LEFT VENTRICULAR WALL MOTION STUDY LEFT VENTRICULAR EJECTION FRACTION TECHNIQUE: Standard myocardial SPECT imaging was performed after resting intravenous injection of 10 mCi Tc-12m sestamibi. Subsequently, intravenous infusion of Lexiscan was performed under the supervision of the Cardiology staff. At peak  effect of the drug, 30 mCi Tc-22m sestamibi was injected intravenously and standard myocardial SPECT imaging was performed. Quantitative gated imaging was also performed to evaluate left ventricular wall motion, and estimate left ventricular ejection fraction. COMPARISON:  None. FINDINGS: Perfusion: No decreased activity in the left ventricle on stress imaging to suggest reversible ischemia or infarction. Wall Motion: Normal left ventricular wall motion. No left ventricular dilation. Left Ventricular Ejection Fraction: 62 % End diastolic volume [id number] ml End systolic volume 42 ml IMPRESSION: 1. No reversible ischemia or infarction. 2. Normal left ventricular wall motion. 3. Left ventricular ejection fraction 62% 4. Low-risk stress test findings*. *2012 Appropriate Use Criteria for Coronary Revascularization Focused Update: J Am Coll Cardiol. [id number]. http://content.[email or web address].aspx?[email or web address] Electronically Signed   By: Earle Gell M.D.   On: 01/02/2016 14:25   Ir Fluoro Guide Ndl Plmt / Bx  01/07/2016  CLINICAL DATA:  Pseudotumor cerebri , post lumbar puncture 01/03/2016. Worsening headache postprocedure, despite laying flat over the weekend. EXAM: LUMBAR EPIDUROGRAM AND BLOOD PATCH FLUOROSCOPY TIME:  0.3 minutes, 15 uGym2 DAP COMPARISON:   12/17/2015 TECHNIQUE: The procedure, risks (including but not limited to bleeding, infection, organ damage ), benefits, and alternatives were explained to the patient. Questions regarding the procedure were encouraged and answered. The patient understands and consents to the procedure. The skin entry site from previous lumbar puncture was localized corresponding to the space between the L3 and L4 spinous processes, to the right of midline. An appropriate skin entry site was determined. Operator donned sterile gloves and mask. Skin site was marked, prepped with Betadine, and draped in usual sterile fashion, and infiltrated locally with 1% lidocaine. The 20 gauge Crawford needle was advanced into the lumbar posterior epidural space near the midline using a right interlaminar approach at L3-L4using loss of resistance technique. Diagnostic injection of 66ml Omnipaque 180 demonstrates good epidural spread above and below the needle tip and crossing the midline. No intravascular or subarachnoid component. 20 ml of autologous blood were then administered as lumbar epidural blood patch. No immediate complication. IMPRESSION: 1. Technically successful lumbar epidural blood patch under fluoroscopy. The patient was counseled on the importance of laying flat for an additional 24 hours and maintaining good p.o. fluid intake. Electronically Signed   By: Lucrezia Europe M.D.   On: 01/07/2016 12:35   Dg Chest Portable 1 View  01/01/2016  CLINICAL DATA:  Chest pain, shortness of breath, dizziness EXAM: PORTABLE CHEST 1 VIEW COMPARISON:  12/23/2015 FINDINGS: Cardiomediastinal silhouette is stable. No acute infiltrate or pleural effusion. No pulmonary edema. Bony thorax is unremarkable. IMPRESSION: No active disease. Electronically Signed   By: Lahoma Crocker M.D.   On: 01/01/2016 11:35   Dg Fluoro Guided Needle Plc Aspiration/injection Loc  01/03/2016  CLINICAL DATA:  Pseudotumor cerebri. Worsening headache and blurred vision. EXAM:  THERAPEUTIC LUMBAR PUNCTURE UNDER FLUOROSCOPIC GUIDANCE FLUOROSCOPY TIME:  Fluoroscopy Time (in minutes and seconds): 0 minutes 36 seconds Number of Acquired Images:  1 PROCEDURE: Informed consent was obtained from the patient prior to the procedure, including potential complications of headache, allergy, and pain. With the patient prone, the lower back was prepped with Betadine. 1% Lidocaine was used for local anesthesia. Lumbar puncture was performed at the L3-4 level using a 20 gauge needle with return of clear CSF with an opening pressure of 26 cm water. 38 ml of CSF were obtained. No laboratory evaluation of the fluid was ordered by the ordering physician and this was done for therapeutic purposes. Closing pressure measured less  than 9 cm water. The patient tolerated the procedure well and there were no apparent complications. IMPRESSION: Successful therapeutic lumbar puncture at L3-4 which removed 38 mL of clear CSF. No evidence of immediate complication. Electronically Signed   By: Earle Gell M.D.   On: 01/03/2016 10:11    CBC  Recent Labs Lab 01/01/16 1033 01/02/16 1037  WBC 5.6 5.7  HGB 13.5 14.3  HCT 40.9 42.9  PLT 355 368  MCV 87.4 88.1  MCH 28.8 29.4  MCHC 33.0 33.3  RDW 13.5 13.6    Chemistries   Recent Labs Lab 01/01/16 1033 01/02/16 1037  NA 138 138  K 3.7 3.4*  CL 107 108  CO2 21* 22  GLUCOSE 93 134*  BUN 12 11  CREATININE 0.75 0.78  CALCIUM 9.1 9.1   ------------------------------------------------------------------------------------------------------------------ estimated creatinine clearance is 129.4 mL/min (by C-G formula based on Cr of 0.78). ------------------------------------------------------------------------------------------------------------------ No results for input(s): HGBA1C in the last 72 hours. ------------------------------------------------------------------------------------------------------------------ No results for input(s): CHOL, HDL,  LDLCALC, TRIG, CHOLHDL, LDLDIRECT in the last 72 hours. ------------------------------------------------------------------------------------------------------------------ No results for input(s): TSH, T4TOTAL, T3FREE, THYROIDAB in the last 72 hours.  Invalid input(s): FREET3 ------------------------------------------------------------------------------------------------------------------ No results for input(s): VITAMINB12, FOLATE, FERRITIN, TIBC, IRON, RETICCTPCT in the last 72 hours.  Coagulation profile No results for input(s): INR, PROTIME in the last 168 hours.  No results for input(s): DDIMER in the last 72 hours.  Cardiac Enzymes  Recent Labs Lab 01/01/16 2045 01/02/16 0317 01/02/16 1037  TROPONINI <0.03 <0.03 <0.03   ------------------------------------------------------------------------------------------------------------------ Invalid input(s): POCBNP  No results for input(s): GLUCAP in the last 72 hours.   Kaiea Esselman M.D. [facility] 01/07/2016, 1:29 PM  Pager: [phone] Between 7am to 7pm - call Pager - [phone]  After 7pm go to [email or web address] - password [email or web address]  Call night coverage person covering after 7pm

## 2016-01-07 NOTE — Procedures (Signed)
Blood patch R [id number] No complication No blood loss. See complete dictation in [facility].

## 2016-01-08 MED ORDER — POLYETHYLENE GLYCOL 3350 17 G PO PACK: 17.0000 g | PACK | Freq: Every day | ORAL | Status: DC | PRN

## 2016-01-08 MED ORDER — DOCUSATE SODIUM 100 MG PO CAPS: 100.0000 mg | ORAL_CAPSULE | Freq: Two times a day (BID) | ORAL | Status: DC

## 2016-01-08 NOTE — Evaluation (Signed)
Physical Therapy Evaluation Patient Details Name: [name] MRN: [id number] DOB: April 15, 1976 Today's Date: 01/08/2016   History of Present Illness  40 year old female patient with history of obesity, pseudotumor cerebri sometimes not compliant with Diamox, asthma, GERD, frequent headaches as well as chronic low back pain, hypertension and recent outpatient evaluation for rectal bleeding gastroenterology who presents to the ER complaints of chest pain associated with shortness of breath.  Clinical Impression  Pt limited by pain, decreased strength and balance and currently requires min A for mobility. Pt will benefit from skilled PT to address deficits and increase functional independence.    Follow Up Recommendations Home health PT;Supervision/Assistance - 24 hour    Equipment Recommendations  Other (comment) (TBD)    Recommendations for Other Services       Precautions / Restrictions Precautions Precautions: Fall Restrictions Weight Bearing Restrictions: No      Mobility  Bed Mobility Overal bed mobility: Modified Independent             General bed mobility comments: increased time  Transfers Overall transfer level: Needs assistance Equipment used: None Transfers: Sit to/from Stand Sit to Stand: Min assist         General transfer comment: pt requires lift and steady assist to stand due to weakness and c/o head and back ache  Ambulation/Gait Ambulation/Gait assistance: Min assist Ambulation Distance (Feet): 20 Feet Assistive device: None   Gait velocity: decreased   General Gait Details: pt unable to stand fully upright in trunk due to back pain with standing, holds to sink and door in room during gait, min A for balance and steadying  Stairs            Wheelchair Mobility    Modified Rankin (Stroke Patients Only)       Balance Overall balance assessment: Needs assistance           Standing balance-Leahy Scale: Poor Standing balance  comment: needs min A for standing balance for functional tasks                             Pertinent Vitals/Pain Pain Assessment: 0-10 Pain Score: 8  Pain Location: head Pain Descriptors / Indicators: Aching Pain Intervention(s): Limited activity within patient's tolerance;Monitored during session    Home Living Family/patient expects to be discharged to:: Private residence Living Arrangements: Spouse/significant other Available Help at Discharge: Available PRN/intermittently Type of Home:  (extended stay hotel) Home Access: Level entry     Home Layout: One level Home Equipment: None      Prior Function Level of Independence: Independent               Hand Dominance        Extremity/Trunk Assessment   Upper Extremity Assessment: Generalized weakness           Lower Extremity Assessment: Generalized weakness      Cervical / Trunk Assessment: Normal  Communication   Communication: No difficulties  Cognition Arousal/Alertness: Awake/alert Behavior During Therapy: WFL for tasks assessed/performed Overall Cognitive Status: Within Functional Limits for tasks assessed                      General Comments      Exercises        Assessment/Plan    PT Assessment Patient needs continued PT services  PT Diagnosis Difficulty walking;Generalized weakness   PT Problem List Decreased strength;Decreased activity tolerance;Decreased balance;Decreased mobility;Pain  PT Treatment Interventions DME instruction;Gait training;Functional mobility training;Therapeutic activities;Therapeutic exercise;Balance training;Neuromuscular re-education;Stair training;Patient/family education;Modalities   PT Goals (Current goals can be found in the Care Plan section) Acute Rehab PT Goals Patient Stated Goal: feel stronger and go home PT Goal Formulation: With patient Time For Goal Achievement: 01/22/16 Potential to Achieve Goals: Good    Frequency Min  3X/week   Barriers to discharge Decreased caregiver support pts husband works    Co-evaluation               End of Session   Activity Tolerance: Patient limited by pain (headache increases with standing) Patient left: in bed;with call bell/phone within reach Nurse Communication: Mobility status         Time: 1020-1045 PT Time Calculation (min) (ACUTE ONLY): 25 min   Charges:   PT Evaluation $PT Eval Moderate Complexity: 1 Procedure PT Treatments $Therapeutic Activity: 8-22 mins   PT G Codes:        Jonuel Butterfield 02/04/2016, 10:47 AM

## 2016-01-23 ENCOUNTER — Encounter (HOSPITAL_COMMUNITY): Payer: Self-pay | Admitting: *Deleted

## 2016-01-23 ENCOUNTER — Telehealth: Payer: Self-pay | Admitting: Internal Medicine

## 2016-01-23 ENCOUNTER — Encounter (HOSPITAL_COMMUNITY)
Admission: RE | Disposition: A | Discharge status: Home / Self Care | Payer: Self-pay | Source: Ambulatory Visit | Attending: Internal Medicine

## 2016-01-23 ENCOUNTER — Ambulatory Visit (HOSPITAL_COMMUNITY)
Admission: RE | Admit: 2016-01-23 | Discharge: 2016-01-23 | Disposition: A | Discharge status: Home / Self Care | Payer: BLUE CROSS/BLUE SHIELD | Source: Ambulatory Visit | Attending: Internal Medicine | Admitting: Internal Medicine

## 2016-01-23 DIAGNOSIS — K648 Other hemorrhoids: Secondary | ICD-10-CM | POA: Insufficient documentation

## 2016-01-23 DIAGNOSIS — K641 Second degree hemorrhoids: Secondary | ICD-10-CM

## 2016-01-23 DIAGNOSIS — K219 Gastro-esophageal reflux disease without esophagitis: Secondary | ICD-10-CM | POA: Insufficient documentation

## 2016-01-23 DIAGNOSIS — K5900 Constipation, unspecified: Secondary | ICD-10-CM | POA: Diagnosis not present

## 2016-01-23 DIAGNOSIS — K921 Melena: Secondary | ICD-10-CM | POA: Diagnosis not present

## 2016-01-23 DIAGNOSIS — R079 Chest pain, unspecified: Secondary | ICD-10-CM | POA: Insufficient documentation

## 2016-01-23 DIAGNOSIS — K625 Hemorrhage of anus and rectum: Secondary | ICD-10-CM

## 2016-01-23 DIAGNOSIS — R109 Unspecified abdominal pain: Secondary | ICD-10-CM | POA: Insufficient documentation

## 2016-01-23 DIAGNOSIS — I10 Essential (primary) hypertension: Secondary | ICD-10-CM | POA: Diagnosis not present

## 2016-01-23 DIAGNOSIS — J45909 Unspecified asthma, uncomplicated: Secondary | ICD-10-CM | POA: Diagnosis not present

## 2016-01-23 DIAGNOSIS — Z79899 Other long term (current) drug therapy: Secondary | ICD-10-CM | POA: Diagnosis not present

## 2016-01-23 DIAGNOSIS — K449 Diaphragmatic hernia without obstruction or gangrene: Secondary | ICD-10-CM | POA: Diagnosis not present

## 2016-01-23 HISTORY — PX: ESOPHAGOGASTRODUODENOSCOPY: SHX5428

## 2016-01-23 HISTORY — PX: COLONOSCOPY: SHX5424

## 2016-01-23 SURGERY — COLONOSCOPY
Anesthesia: Moderate Sedation

## 2016-01-23 MED ORDER — ONDANSETRON HCL 4 MG/2ML IJ SOLN
INTRAMUSCULAR | Status: DC | PRN
  Administered 2016-01-23: 4 mg via INTRAVENOUS

## 2016-01-23 MED ORDER — ONDANSETRON HCL 4 MG/2ML IJ SOLN
INTRAMUSCULAR | Status: AC
  Filled 2016-01-23: qty 2

## 2016-01-23 MED ORDER — LIDOCAINE VISCOUS 2 % MT SOLN
OROMUCOSAL | Status: DC | PRN
  Administered 2016-01-23: 3 mL via OROMUCOSAL

## 2016-01-23 MED ORDER — MEPERIDINE HCL 100 MG/ML IJ SOLN
INTRAMUSCULAR | Status: DC | PRN
  Administered 2016-01-23: 50 mg via INTRAVENOUS
  Administered 2016-01-23: 25 mg via INTRAVENOUS

## 2016-01-23 MED ORDER — PROMETHAZINE HCL 25 MG/ML IJ SOLN
INTRAMUSCULAR | Status: AC
  Filled 2016-01-23: qty 1

## 2016-01-23 MED ORDER — SODIUM CHLORIDE 0.9 % IV SOLN
INTRAVENOUS | Status: DC
  Administered 2016-01-23: 1000 mL via INTRAVENOUS

## 2016-01-23 MED ORDER — PROMETHAZINE HCL 25 MG/ML IJ SOLN
INTRAMUSCULAR | Status: DC | PRN
  Administered 2016-01-23: 12.5 mg via INTRAVENOUS

## 2016-01-23 MED ORDER — STERILE WATER FOR IRRIGATION IR SOLN
Status: DC | PRN
  Administered 2016-01-23: 15:00:00

## 2016-01-23 MED ORDER — MIDAZOLAM HCL 5 MG/5ML IJ SOLN
INTRAMUSCULAR | Status: DC | PRN
  Administered 2016-01-23: 2 mg via INTRAVENOUS
  Administered 2016-01-23: 1 mg via INTRAVENOUS
  Administered 2016-01-23: 2 mg via INTRAVENOUS
  Administered 2016-01-23: 1 mg via INTRAVENOUS

## 2016-01-23 MED ORDER — LIDOCAINE VISCOUS 2 % MT SOLN
OROMUCOSAL | Status: AC
  Filled 2016-01-23: qty 15

## 2016-01-23 MED ORDER — SODIUM CHLORIDE 0.9% FLUSH
INTRAVENOUS | Status: AC
  Filled 2016-01-23: qty 10

## 2016-01-23 MED ORDER — MIDAZOLAM HCL 5 MG/5ML IJ SOLN
INTRAMUSCULAR | Status: AC
  Filled 2016-01-23: qty 10

## 2016-01-23 MED ORDER — MEPERIDINE HCL 100 MG/ML IJ SOLN
INTRAMUSCULAR | Status: AC
  Filled 2016-01-23: qty 2

## 2016-01-23 MED ORDER — PROMETHAZINE HCL 25 MG/ML IJ SOLN: 25.0000 mg | Freq: Once | INTRAMUSCULAR | Status: DC

## 2016-01-23 NOTE — Op Note (Signed)
[facility] Patient Name: [name] Procedure Date: 01/23/2016 2:31 PM MRN: [id number] Date of Birth: [birth year] Attending MD: Norvel Richards , MD CSN: [id number] Age: 40 Admit Type: Outpatient Procedure:                Colonoscopy Indications:              Hematochezia; constipation Providers:                Norvel Richards, MD, Gwenlyn Fudge, RN, Randa Spike, Technician Referring MD:              Medicines:                Midazolam 6 mg IV, Promethazine 12.5 mg IV,                            Meperidine 75 mg IV, Ondansetron 4 mg IV Complications:            No immediate complications. Estimated Blood Loss:     Estimated blood loss: none. Procedure:                Pre-Anesthesia Assessment:                           - Prior to the procedure, a History and Physical                            was performed, and patient medications and                            allergies were reviewed. The patient's tolerance of                            previous anesthesia was also reviewed. The risks                            and benefits of the procedure and the sedation                            options and risks were discussed with the patient.                            All questions were answered, and informed consent                            was obtained. Prior Anticoagulants: The patient has                            taken no previous anticoagulant or antiplatelet                            agents. ASA Grade Assessment: II - A patient with  mild systemic disease. After reviewing the risks                            and benefits, the patient was deemed in                            satisfactory condition to undergo the procedure.                           After obtaining informed consent, the colonoscope                            was passed under direct vision. Throughout the                            procedure,  the patient's blood pressure, pulse, and                            oxygen saturations were monitored continuously. The                            [id number] scope was introduced through                            the anus and advanced to the the cecum, identified                            by appendiceal orifice and ileocecal valve. The                            colonoscopy was performed without difficulty. The                            patient tolerated the procedure well. The quality                            of the bowel preparation was adequate. The                            ileocecal valve, appendiceal orifice, and rectum                            were photographed. Scope In: 3:07:24 PM Scope Out: 3:17:00 PM Scope Withdrawal Time: 0 hours 5 minutes 32 seconds  Total Procedure Duration: 0 hours 9 minutes 36 seconds  Findings:      The perianal and digital rectal examinations were normal.      Internal hemorrhoids were found. The hemorrhoids were Grade II (internal       hemorrhoids that prolapse but reduce spontaneously).      The colon (entire examined portion) appeared normal. Estimated blood       loss: none. Impression:               - Internal hemorrhoids.                           -  The entire examined colon is normal.                           - No specimens collected. hematochezia likely                            secondary to hemorrhoids in the setting of                            constipation Moderate Sedation:      Moderate (conscious) sedation was administered by the endoscopy nurse       and supervised by the endoscopist. The following parameters were       monitored: oxygen saturation, heart rate, blood pressure, respiratory       rate, EKG, adequacy of pulmonary ventilation, and response to care.       Total physician intraservice time was 34 minutes. Recommendation:           - Patient has a contact number available for                             emergencies. The signs and symptoms of potential                            delayed complications were discussed with the                            patient. Return to normal activities tomorrow.                            Written discharge instructions were provided to the                            patient.                           - Advance diet as tolerated.                           - Continue present medications.                           - Repeat colonoscopy in 10 years for screening                            purposes.                           - Return to GI office in 4 weeks. avoid Epsom                            salts. Use MiraLAX 17 g orally nightly as needed                            for constipation Procedure Code(s):        --- Professional ---  [phone], Colonoscopy, flexible; diagnostic, including                            collection of specimen(s) by brushing or washing,                            when performed (separate procedure)                           99152, Moderate sedation services provided by the                            same physician or other qualified health care                            professional performing the diagnostic or                            therapeutic service that the sedation supports,                            requiring the presence of an independent trained                            observer to assist in the monitoring of the                            patient's level of consciousness and physiological                            status; initial 15 minutes of intraservice time,                            patient age 33 years or older                           [phone], Moderate sedation services; each additional                            15 minutes intraservice time Diagnosis Code(s):        --- Professional ---                           K64.1, Second degree hemorrhoids                           K92.1, Melena  (includes Hematochezia) CPT copyright 2016 [employer]. All rights reserved. The codes documented in this report are preliminary and upon coder review may  be revised to meet current compliance requirements. Cristopher Estimable. Orlandria Kissner, MD Norvel Richards, MD 01/23/2016 3:31:17 PM This report has been signed electronically. Number of Addenda: 0

## 2016-01-23 NOTE — Telephone Encounter (Signed)
Pt is leaving Short Stay now and husband is going to stop by to pick up Dexilant 60 mg (daily) per RMR.

## 2016-01-23 NOTE — Interval H&P Note (Signed)
History and Physical Interval Note:  01/23/2016 2:34 PM  [name]  has presented today for surgery, with the diagnosis of rectal bleeding  The various methods of treatment have been discussed with the patient and family. After consideration of risks, benefits and other options for treatment, the patient has consented to  Procedure(s) with comments: COLONOSCOPY (N/A) - 1430 - moved to 1:45 - office to notify as a surgical intervention .  The patient's history has been reviewed, patient examined, no change in status, stable for surgery.  I have reviewed the patient's chart and labs.  Questions were answered to the patient's satisfaction.     Patient has had significant chest pain issue since seen in the office. Has had a cardiac evaluation. Chest pain felt to be GERD related. Patient request EGD along with colonoscopy. This is not at all unreasonable. She denies dysphagia. Didn't like Prilosec. No PPI ongoing at this time.    We'll plan for diagnostic EGD followed by colonoscopy today.The risks, benefits, limitations, imponderables and alternatives regarding both EGD and colonoscopy have been reviewed with the patient. Questions have been answered. All parties agreeable.   Manus Rudd
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
## 2016-01-23 NOTE — Telephone Encounter (Signed)
Samples are at the front desk. 

## 2016-01-23 NOTE — Discharge Instructions (Addendum)
Colonoscopy Discharge Instructions  Read the instructions outlined below and refer to this sheet in the next few weeks. These discharge instructions provide you with general information on caring for yourself after you leave the hospital. Your doctor may also give you specific instructions. While your treatment has been planned according to the most current medical practices available, unavoidable complications occasionally occur. If you have any problems or questions after discharge, call Dr. Gala Romney at [phone]. ACTIVITY  You may resume your regular activity, but move at a slower pace for the next 24 hours.   Take frequent rest periods for the next 24 hours.   Walking will help get rid of the air and reduce the bloated feeling in your belly (abdomen).   No driving for 24 hours (because of the medicine (anesthesia) used during the test).    Do not sign any important legal documents or operate any machinery for 24 hours (because of the anesthesia used during the test).  NUTRITION  Drink plenty of fluids.   You may resume your normal diet as instructed by your doctor.   Begin with a light meal and progress to your normal diet. Heavy or fried foods are harder to digest and may make you feel sick to your stomach (nauseated).   Avoid alcoholic beverages for 24 hours or as instructed.  MEDICATIONS  You may resume your normal medications unless your doctor tells you otherwise.  WHAT YOU CAN EXPECT TODAY  Some feelings of bloating in the abdomen.   Passage of more gas than usual.   Spotting of blood in your stool or on the toilet paper.  IF YOU HAD POLYPS REMOVED DURING THE COLONOSCOPY:  No aspirin products for 7 days or as instructed.   No alcohol for 7 days or as instructed.   Eat a soft diet for the next 24 hours.  FINDING OUT THE RESULTS OF YOUR TEST Not all test results are available during your visit. If your test results are not back during the visit, make an appointment  with your caregiver to find out the results. Do not assume everything is normal if you have not heard from your caregiver or the medical facility. It is important for you to follow up on all of your test results.  SEEK IMMEDIATE MEDICAL ATTENTION IF:  You have more than a spotting of blood in your stool.   Your belly is swollen (abdominal distention).   You are nauseated or vomiting.   You have a temperature over 101.   You have abdominal pain or discomfort that is severe or gets worse throughout the day.   EGD Discharge instructions Please read the instructions outlined below and refer to this sheet in the next few weeks. These discharge instructions provide you with general information on caring for yourself after you leave the hospital. Your doctor may also give you specific instructions. While your treatment has been planned according to the most current medical practices available, unavoidable complications occasionally occur. If you have any problems or questions after discharge, please call your doctor. ACTIVITY  You may resume your regular activity but move at a slower pace for the next 24 hours.   Take frequent rest periods for the next 24 hours.   Walking will help expel (get rid of) the air and reduce the bloated feeling in your abdomen.   No driving for 24 hours (because of the anesthesia (medicine) used during the test).   You may shower.   Do not sign any  important legal documents or operate any machinery for 24 hours (because of the anesthesia used during the test).  NUTRITION  Drink plenty of fluids.   You may resume your normal diet.   Begin with a light meal and progress to your normal diet.   Avoid alcoholic beverages for 24 hours or as instructed by your caregiver.  MEDICATIONS  You may resume your normal medications unless your caregiver tells you otherwise.  WHAT YOU CAN EXPECT TODAY  You may experience abdominal discomfort such as a feeling of  fullness or gas pains.  FOLLOW-UP  Your doctor will discuss the results of your test with you.  SEEK IMMEDIATE MEDICAL ATTENTION IF ANY OF THE FOLLOWING OCCUR:  Excessive nausea (feeling sick to your stomach) and/or vomiting.   Severe abdominal pain and distention (swelling).   Trouble swallowing.   Temperature over 101 F (37.8 C).   Rectal bleeding or vomiting of blood.     GERD information provided  Stop Protonix; begin Dexilant 60 mg daily   *******CAN GO BY OFFICE TO GET SAMPLES OF DEXILANT********  Hemorrhoid information provided  Ten-day course of Anusol suppositories 1 per rectum at bedtime  Avoid Epsom salts for constipation  Use MiraLAX   -  a capful a powder in 8 ounces of water nightly as needed for constipation  Office visit with [place] in 4-6 weeks    FOLLOW UP APPOINTMENT:  WITH Laban Emperor, NP ON Thursday MAY, 18, 2017 AT 8AM.     Gastroesophageal Reflux Disease, Adult Normally, food travels down the esophagus and stays in the stomach to be digested. However, when a person has gastroesophageal reflux disease (GERD), food and stomach acid move back up into the esophagus. When this happens, the esophagus becomes sore and inflamed. Over time, GERD can create small holes (ulcers) in the lining of the esophagus.  CAUSES This condition is caused by a problem with the muscle between the esophagus and the stomach (lower esophageal sphincter, or LES). Normally, the LES muscle closes after food passes through the esophagus to the stomach. When the LES is weakened or abnormal, it does not close properly, and that allows food and stomach acid to go back up into the esophagus. The LES can be weakened by certain dietary substances, medicines, and medical conditions, including:  Tobacco use.  Pregnancy.  Having a hiatal hernia.  Heavy alcohol use.  Certain foods and beverages, such as coffee, chocolate, onions, and peppermint. RISK FACTORS This condition is more  likely to develop in:  People who have an increased body weight.  People who have connective tissue disorders.  People who use NSAID medicines. SYMPTOMS Symptoms of this condition include:  Heartburn.  Difficult or painful swallowing.  The feeling of having a lump in the throat.  Abitter taste in the mouth.  Bad breath.  Having a large amount of saliva.  Having an upset or bloated stomach.  Belching.  Chest pain.  Shortness of breath or wheezing.  Ongoing (chronic) cough or a night-time cough.  Wearing away of tooth enamel.  Weight loss. Different conditions can cause chest pain. Make sure to see your health care provider if you experience chest pain. DIAGNOSIS Your health care provider will take a medical history and perform a physical exam. To determine if you have mild or severe GERD, your health care provider may also monitor how you respond to treatment. You may also have other tests, including:  An endoscopy toexamine your stomach and esophagus with a small  camera.  A test thatmeasures the acidity level in your esophagus.  A test thatmeasures how much pressure is on your esophagus.  A barium swallow or modified barium swallow to show the shape, size, and functioning of your esophagus. TREATMENT The goal of treatment is to help relieve your symptoms and to prevent complications. Treatment for this condition may vary depending on how severe your symptoms are. Your health care provider may recommend:  Changes to your diet.  Medicine.  Surgery. HOME CARE INSTRUCTIONS Diet  Follow a diet as recommended by your health care provider. This may involve avoiding foods and drinks such as:  Coffee and tea (with or without caffeine).  Drinks that containalcohol.  Energy drinks and sports drinks.  Carbonated drinks or sodas.  Chocolate and cocoa.  Peppermint and mint flavorings.  Garlic and onions.  Horseradish.  Spicy and acidic foods,  including peppers, chili powder, curry powder, vinegar, hot sauces, and barbecue sauce.  Citrus fruit juices and citrus fruits, such as oranges, lemons, and limes.  Tomato-based foods, such as red sauce, chili, salsa, and pizza with red sauce.  Fried and fatty foods, such as donuts, french fries, potato chips, and high-fat dressings.  High-fat meats, such as hot dogs and fatty cuts of red and white meats, such as rib eye steak, sausage, ham, and bacon.  High-fat dairy items, such as whole milk, butter, and cream cheese.  Eat small, frequent meals instead of large meals.  Avoid drinking large amounts of liquid with your meals.  Avoid eating meals during the 2-3 hours before bedtime.  Avoid lying down right after you eat.  Do not exercise right after you eat. General Instructions  Pay attention to any changes in your symptoms.  Take over-the-counter and prescription medicines only as told by your health care provider. Do not take aspirin, ibuprofen, or other NSAIDs unless your health care provider told you to do so.  Do not use any tobacco products, including cigarettes, chewing tobacco, and e-cigarettes. If you need help quitting, ask your health care provider.  Wear loose-fitting clothing. Do not wear anything tight around your waist that causes pressure on your abdomen.  Raise (elevate) the head of your bed 6 inches (15cm).  Try to reduce your stress, such as with yoga or meditation. If you need help reducing stress, ask your health care provider.  If you are overweight, reduce your weight to an amount that is healthy for you. Ask your health care provider for guidance about a safe weight loss goal.  Keep all follow-up visits as told by your health care provider. This is important. SEEK MEDICAL CARE IF:  You have new symptoms.  You have unexplained weight loss.  You have difficulty swallowing, or it hurts to swallow.  You have wheezing or a persistent cough.  Your  symptoms do not improve with treatment.  You have a hoarse voice. SEEK IMMEDIATE MEDICAL CARE IF:  You have pain in your arms, neck, jaw, teeth, or back.  You feel sweaty, dizzy, or light-headed.  You have chest pain or shortness of breath.  You vomit and your vomit looks like blood or coffee grounds.  You faint.  Your stool is bloody or black.  You cannot swallow, drink, or eat.   This information is not intended to replace advice given to you by your health care provider. Make sure you discuss any questions you have with your health care provider.   Document Released: 07/02/2005 Document Revised: 06/13/2015 Document Reviewed: 01/17/2015  Elsevier Interactive Patient Education [employer].  Hemorrhoids Hemorrhoids are swollen veins around the rectum or anus. There are two types of hemorrhoids:   Internal hemorrhoids. These occur in the veins just inside the rectum. They may poke through to the outside and become irritated and painful.  External hemorrhoids. These occur in the veins outside the anus and can be felt as a painful swelling or hard lump near the anus. CAUSES  Pregnancy.   Obesity.   Constipation or diarrhea.   Straining to have a bowel movement.   Sitting for long periods on the toilet.  Heavy lifting or other activity that caused you to strain.  Anal intercourse. SYMPTOMS   Pain.   Anal itching or irritation.   Rectal bleeding.   Fecal leakage.   Anal swelling.   One or more lumps around the anus.  DIAGNOSIS  Your caregiver may be able to diagnose hemorrhoids by visual examination. Other examinations or tests that may be performed include:   Examination of the rectal area with a gloved hand (digital rectal exam).   Examination of anal canal using a small tube (scope).   A blood test if you have lost a significant amount of blood.  A test to look inside the colon (sigmoidoscopy or colonoscopy). TREATMENT Most  hemorrhoids can be treated at home. However, if symptoms do not seem to be getting better or if you have a lot of rectal bleeding, your caregiver may perform a procedure to help make the hemorrhoids get smaller or remove them completely. Possible treatments include:   Placing a rubber band at the base of the hemorrhoid to cut off the circulation (rubber band ligation).   Injecting a chemical to shrink the hemorrhoid (sclerotherapy).   Using a tool to burn the hemorrhoid (infrared light therapy).   Surgically removing the hemorrhoid (hemorrhoidectomy).   Stapling the hemorrhoid to block blood flow to the tissue (hemorrhoid stapling).  HOME CARE INSTRUCTIONS   Eat foods with fiber, such as whole grains, beans, nuts, fruits, and vegetables. Ask your doctor about taking products with added fiber in them (fibersupplements).  Increase fluid intake. Drink enough water and fluids to keep your urine clear or pale yellow.   Exercise regularly.   Go to the bathroom when you have the urge to have a bowel movement. Do not wait.   Avoid straining to have bowel movements.   Keep the anal area dry and clean. Use wet toilet paper or moist towelettes after a bowel movement.   Medicated creams and suppositories may be used or applied as directed.   Only take over-the-counter or prescription medicines as directed by your caregiver.   Take warm sitz baths for 15-20 minutes, 3-4 times a day to ease pain and discomfort.   Place ice packs on the hemorrhoids if they are tender and swollen. Using ice packs between sitz baths may be helpful.   Put ice in a plastic bag.   Place a towel between your skin and the bag.   Leave the ice on for 15-20 minutes, 3-4 times a day.   Do not use a donut-shaped pillow or sit on the toilet for long periods. This increases blood pooling and pain.  SEEK MEDICAL CARE IF:  You have increasing pain and swelling that is not controlled by treatment or  medicine.  You have uncontrolled bleeding.  You have difficulty or you are unable to have a bowel movement.  You have pain or inflammation outside the area of  the hemorrhoids. MAKE SURE YOU:  Understand these instructions.  Will watch your condition.  Will get help right away if you are not doing well or get worse.   This information is not intended to replace advice given to you by your health care provider. Make sure you discuss any questions you have with your health care provider.   Document Released: 09/19/2000 Document Revised: 09/08/2012 Document Reviewed: 07/27/2012 Elsevier Interactive Patient Education [employer].

## 2016-01-28 NOTE — Op Note (Signed)
[facility] Patient Name: [name] Procedure Date: 01/23/2016 2:43 PM MRN: [id number] Date of Birth: 04/17/1976 Attending MD: Norvel Richards , MD CSN: [id number] Age: 40 Admit Type: Outpatient Procedure:                Upper GI endoscopy Indications:              GERD; chest pain not felt to be cardiac in origin Providers:                Norvel Richards, MD, Gwenlyn Fudge, RN, Randa Spike, Technician Referring MD:              Medicines:                Midazolam 4 mg IV, Promethazine 12.5 mg IV,                            Meperidine 70 mg IV, Ondansetron 4 mg IV Complications:            No immediate complications. Estimated Blood Loss:     Estimated blood loss: none. Procedure:                Pre-Anesthesia Assessment:                           - Prior to the procedure, a History and Physical                            was performed, and patient medications and                            allergies were reviewed. The patient's tolerance of                            previous anesthesia was also reviewed. The risks                            and benefits of the procedure and the sedation                            options and risks were discussed with the patient.                            All questions were answered, and informed consent                            was obtained. Prior Anticoagulants: The patient has                            taken no previous anticoagulant or antiplatelet                            agents. ASA Grade Assessment: II - A patient with  mild systemic disease. After reviewing the risks                            and benefits, the patient was deemed in                            satisfactory condition to undergo the procedure.                           After obtaining informed consent, the endoscope was                            passed under direct vision. Throughout the                   procedure, the patient's blood pressure, pulse, and                            oxygen saturations were monitored continuously. The                            [id number]) was introduced through the                            mouth, and advanced to the second part of duodenum.                            The upper GI endoscopy was accomplished without                            difficulty. The patient tolerated the procedure                            well. Scope In: 2:57:56 PM Scope Out: 3:00:05 PM Total Procedure Duration: 0 hours 2 minutes 9 seconds  Findings:      The examined esophagus was normal.      A small hiatal hernia was present.      The exam was otherwise without abnormality.      The second portion of the duodenum was normal. Estimated blood loss:       none. Impression:               - Normal esophagus.                           - Small hiatal hernia.                           - The examination was otherwise normal.                           - Normal second portion of the duodenum.                           - No specimens collected. Moderate Sedation:      Moderate (conscious) sedation was administered by the endoscopy nurse  and supervised by the endoscopist. The following parameters were       monitored: oxygen saturation, heart rate, blood pressure, respiratory       rate, EKG, adequacy of pulmonary ventilation, and response to care.       Total physician intraservice time was 22 minutes.      Moderate (conscious) sedation was administered by the endoscopy nurse       and supervised by the endoscopist. [Parameters Monitored]. [Procedure       Duration Time]. Recommendation:           - Patient has a contact number available for                            emergencies. The signs and symptoms of potential                            delayed complications were discussed with the                            patient. Return to normal activities  tomorrow.                            Written discharge instructions were provided to the                            patient.                           - Advance diet as tolerated.                           - Continue present medications.                           - Use Dexilant (dexlansoprazole) 60 mg PO daily.                           - Return to GI office in 3 months. Procedure Code(s):        --- Professional ---                           [phone], Esophagogastroduodenoscopy, flexible,                            transoral; diagnostic, including collection of                            specimen(s) by brushing or washing, when performed                            (separate procedure)                           99152, Moderate sedation services provided by the                            same physician or other qualified  health care                            professional performing the diagnostic or                            therapeutic service that the sedation supports,                            requiring the presence of an independent trained                            observer to assist in the monitoring of the                            patient's level of consciousness and physiological                            status; initial 15 minutes of intraservice time,                            patient age 11 years or older                           [phone], Moderate sedation services provided by the                            same physician or other qualified health care                            professional performing the diagnostic or                            therapeutic service that the sedation supports,                            requiring the presence of an independent trained                            observer to assist in the monitoring of the                            patient's level of consciousness and physiological                            status; initial 15 minutes of  intraservice time,                            patient age 62 years or older Diagnosis Code(s):        --- Professional ---                           K44.9, Diaphragmatic hernia without obstruction or  gangrene                           R13.10, Dysphagia, unspecified CPT copyright 2016 [employer]. All rights reserved. The codes documented in this report are preliminary and upon coder review may  be revised to meet current compliance requirements. Cristopher Estimable. Trenell Moxey, MD Norvel Richards, MD 01/28/2016 10:06:07 AM This report has been signed electronically. Number of Addenda: 0

## 2016-01-30 ENCOUNTER — Encounter (HOSPITAL_BASED_OUTPATIENT_CLINIC_OR_DEPARTMENT_OTHER): Payer: Self-pay

## 2016-01-30 ENCOUNTER — Emergency Department (HOSPITAL_BASED_OUTPATIENT_CLINIC_OR_DEPARTMENT_OTHER)
Admission: EM | Admit: 2016-01-30 | Discharge: 2016-01-30 | Disposition: A | Discharge status: Home / Self Care | Payer: BLUE CROSS/BLUE SHIELD | Attending: Emergency Medicine | Admitting: Emergency Medicine

## 2016-01-30 DIAGNOSIS — N3001 Acute cystitis with hematuria: Secondary | ICD-10-CM

## 2016-01-30 DIAGNOSIS — J45909 Unspecified asthma, uncomplicated: Secondary | ICD-10-CM | POA: Insufficient documentation

## 2016-01-30 DIAGNOSIS — I10 Essential (primary) hypertension: Secondary | ICD-10-CM | POA: Diagnosis not present

## 2016-01-30 DIAGNOSIS — Z79899 Other long term (current) drug therapy: Secondary | ICD-10-CM | POA: Diagnosis not present

## 2016-01-30 DIAGNOSIS — N939 Abnormal uterine and vaginal bleeding, unspecified: Secondary | ICD-10-CM | POA: Diagnosis present

## 2016-01-30 DIAGNOSIS — Z7982 Long term (current) use of aspirin: Secondary | ICD-10-CM | POA: Diagnosis not present

## 2016-01-30 LAB — COMPREHENSIVE METABOLIC PANEL
ALT: 18 U/L (ref 14–54)
AST: 18 U/L (ref 15–41)
Albumin: 3.9 g/dL (ref 3.5–5.0)
Alkaline Phosphatase: 54 U/L (ref 38–126)
Anion gap: 3 — ABNORMAL LOW (ref 5–15)
BUN: 13 mg/dL (ref 6–20)
CO2: 26 mmol/L (ref 22–32)
Calcium: 8.7 mg/dL — ABNORMAL LOW (ref 8.9–10.3)
Chloride: 107 mmol/L (ref 101–111)
Creatinine, Ser: 0.67 mg/dL (ref 0.44–1.00)
GFR calc Af Amer: >60 mL/min (ref >60)
GFR calc non Af Amer: >60 mL/min (ref >60)
Glucose, Bld: 88 mg/dL (ref 65–99)
Potassium: 3.3 mmol/L — ABNORMAL LOW (ref 3.5–5.1)
Sodium: 136 mmol/L (ref 135–145)
Total Bilirubin: 0.4 mg/dL (ref 0.3–1.2)
Total Protein: 7.7 g/dL (ref 6.5–8.1)

## 2016-01-30 LAB — URINALYSIS, ROUTINE W REFLEX MICROSCOPIC
Glucose, UA: NEGATIVE mg/dL
Ketones, ur: 15 mg/dL — AB
Nitrite: POSITIVE — AB
Protein, ur: 100 mg/dL — AB
Specific Gravity, Urine: 1.017 (ref 1.005–1.030)
pH: 5 (ref 5.0–8.0)

## 2016-01-30 LAB — CBC WITH DIFFERENTIAL/PLATELET
Basophils Absolute: 0.1 10*3/uL (ref 0.0–0.1)
Basophils Relative: 1 %
Eosinophils Absolute: 0.2 10*3/uL (ref 0.0–0.7)
Eosinophils Relative: 3 %
HCT: 39.1 % (ref 36.0–46.0)
Hemoglobin: 13.5 g/dL (ref 12.0–15.0)
Lymphocytes Relative: 25 %
Lymphs Abs: 2.2 10*3/uL (ref 0.7–4.0)
MCH: 29.9 pg (ref 26.0–34.0)
MCHC: 34.5 g/dL (ref 30.0–36.0)
MCV: 86.7 fL (ref 78.0–100.0)
Monocytes Absolute: 0.7 10*3/uL (ref 0.1–1.0)
Monocytes Relative: 8 %
Neutro Abs: 5.6 10*3/uL (ref 1.7–7.7)
Neutrophils Relative %: 63 %
Platelets: 378 10*3/uL (ref 150–400)
RBC: 4.51 MIL/uL (ref 3.87–5.11)
RDW: 13.5 % (ref 11.5–15.5)
WBC: 8.8 10*3/uL (ref 4.0–10.5)

## 2016-01-30 LAB — WET PREP, GENITAL
Trich, Wet Prep: NONE SEEN
Yeast Wet Prep HPF POC: NONE SEEN

## 2016-01-30 LAB — URINE MICROSCOPIC-ADD ON

## 2016-01-30 LAB — PREGNANCY, URINE: Preg Test, Ur: NEGATIVE

## 2016-01-30 MED ORDER — FLUCONAZOLE 100 MG PO TABS
200.0000 mg | ORAL_TABLET | Freq: Every day | ORAL | Status: DC
  Administered 2016-01-30: 200 mg via ORAL
  Filled 2016-01-30: qty 2

## 2016-01-30 MED ORDER — NITROFURANTOIN MONOHYD MACRO 100 MG PO CAPS: 100.0000 mg | ORAL_CAPSULE | Freq: Two times a day (BID) | ORAL | Status: DC

## 2016-01-30 MED FILL — NITROFURANTOIN MONO-MCR 100: 100 | 7 days supply | Qty: 14 | Fill #0

## 2016-01-30 NOTE — ED Provider Notes (Signed)
CSN: [id number]     Arrival date & time 01/30/16  1300 History   First MD Initiated Contact with Patient 01/30/16 1327     Chief Complaint  Patient presents with  . Vaginal Bleeding     (Consider location/radiation/quality/duration/timing/severity/associated sxs/prior Treatment) HPI Patient presents to the emergency department with hematuria and discomfort with urination.  The patient states Magda Paganini.  She noted some discomfort with urination and states that this morning she continued to have this.  She states she noticed that she had some blood in her urine this morning as well.  Patient states she is to get frequent urinary tract infections and this feels similar.  The patient states she took Azo last night without significant relief of her symptoms.The patient denies chest pain, shortness of breath, headache,blurred vision, neck pain, fever, cough, weakness, numbness, dizziness, anorexia, edema, abdominal pain, nausea, vomiting, diarrhea, rash, back pain,, hematemesis, bloody stool, near syncope, or syncope.  Patient states nothing seems to make the condition better or worse Past Medical History  Diagnosis Date  . Pseudotumor cerebri   . GERD (gastroesophageal reflux disease)   . Hypertension   . Chronic lower back pain   . Chronic constipation   . Chronic abdominal pain   . Asthma   . Pneumonia ~ 2000 X 1  . Chronic bronchitis ([place])   . Migraine     "weekly" (01/01/2016)   Past Surgical History  Procedure Laterality Date  . Achilles tendon lengthening Left   . Lumbar puncture  12/04/2011    dr. Merlene Laughter  . Colonoscopy  2008    Dr. Gala Romney: friable anal canal, otherwise normal  . Esophagogastroduodenoscopy  2009    Dr. Gala Romney: normal   . Laparoscopic cholecystectomy    . Abdominal hysterectomy      "partial"  . Dilation and curettage of uterus     Family History  Problem Relation Age of Onset  . Migraines Mother   . Cancer Maternal Uncle   . Cancer Maternal Uncle   . Cancer  Paternal Grandmother   . Cancer Paternal Grandfather   . Colon cancer      unknown   Social History  Substance Use Topics  . Smoking status: Never Smoker   . Smokeless tobacco: Never Used  . Alcohol Use: 0.0 oz/week    0 Standard drinks or equivalent per week     Comment: 01/01/2016 "might have a glass of wine 5-6 times/year"   OB History    Gravida Para Term Preterm AB TAB SAB Ectopic Multiple Living   3 3 3       3      Review of Systems All other systems negative except as documented in the HPI. All pertinent positives and negatives as reviewed in the HPI.   Allergies  Review of patient's allergies indicates no known allergies.  Home Medications   Prior to Admission medications   Medication Sig Start Date End Date Taking? Authorizing Provider  ALPRAZolam Duanne Moron) 1 MG tablet Take 1 mg by mouth 4 (four) times daily as needed for sleep or anxiety. Reported on 12/26/2015   Yes Historical Provider, MD  amLODipine (NORVASC) 5 MG tablet Take 1 tablet (5 mg total) by mouth daily. 01/03/16  Yes Ripudeep Krystal Eaton, MD  aspirin 325 MG tablet Take 325 mg by mouth every 6 (six) hours as needed for mild pain.   Yes Historical Provider, MD  aspirin-acetaminophen-caffeine (EXCEDRIN MIGRAINE) [phone] MG tablet Take 1 tablet by mouth every 6 (six) hours  as needed for headache.   Yes Historical Provider, MD  Aspirin-Salicylamide-Caffeine (BC HEADACHE POWDER PO) Take 1 packet by mouth every 8 (eight) hours as needed (headache).   Yes Historical Provider, MD  atorvastatin (LIPITOR) 40 MG tablet Take 1 tablet (40 mg total) by mouth at bedtime. 01/03/16  Yes Ripudeep Krystal Eaton, MD  docusate sodium (COLACE) 100 MG capsule Take 1 capsule (100 mg total) by mouth 2 (two) times daily. For constipation 01/08/16  Yes Ripudeep Krystal Eaton, MD  polyethylene glycol (MIRALAX / GLYCOLAX) packet Take 17 g by mouth daily as needed for mild constipation or moderate constipation. 01/08/16  Yes Ripudeep Krystal Eaton, MD  topiramate (TOPAMAX)  25 MG tablet Take 25 mg by mouth daily. 12/31/15  Yes Historical Provider, MD  HYDROcodone-acetaminophen (NORCO/VICODIN) 5-325 MG tablet Take 1 tablet by mouth every 6 (six) hours as needed for severe pain. pain 01/03/16   Ripudeep Krystal Eaton, MD  pantoprazole (PROTONIX) 40 MG tablet Take 1 tablet (40 mg total) by mouth daily. 01/03/16   Ripudeep Krystal Eaton, MD  polyethylene glycol-electrolytes (NULYTELY/GOLYTELY) 420 g solution Take 4,000 mLs by mouth once. 12/26/15   Orvil Feil, NP   BP 122/75 mmHg  Pulse 67  Temp(Src) 98.6 F (37 C) (Oral)  Resp 18  Ht 5\' 10"  (1.778 m)  Wt 110.678 kg  BMI 35.01 kg/m2  SpO2 99%  LMP 11/11/2011 Physical Exam  Constitutional: She is oriented to person, place, and time. She appears well-developed and well-nourished. No distress.  HENT:  Head: Normocephalic and atraumatic.  Mouth/Throat: Oropharynx is clear and moist.  Eyes: Pupils are equal, round, and reactive to light.  Neck: Normal range of motion. Neck supple.  Cardiovascular: Normal rate, regular rhythm and normal heart sounds.  Exam reveals no gallop and no friction rub.   No murmur heard. Pulmonary/Chest: Effort normal and breath sounds normal. No respiratory distress. She has no wheezes.  Abdominal: Soft. Bowel sounds are normal. She exhibits no distension. There is no tenderness. There is no rebound and no guarding.  Neurological: She is alert and oriented to person, place, and time. She exhibits normal muscle tone. Coordination normal.  Skin: Skin is warm and dry. No rash noted. No erythema.  Psychiatric: She has a normal mood and affect. Her behavior is normal.  Nursing note and vitals reviewed.   ED Course  Procedures (including critical care time) Labs Review Labs Reviewed  WET PREP, GENITAL - Abnormal; Notable for the following:    Clue Cells Wet Prep HPF POC PRESENT (*)    WBC, Wet Prep HPF POC MODERATE (*)    All other components within normal limits  URINALYSIS, ROUTINE W REFLEX MICROSCOPIC  (NOT AT [facility]) - Abnormal; Notable for the following:    Color, Urine RED (*)    APPearance CLOUDY (*)    Hgb urine dipstick LARGE (*)    Bilirubin Urine SMALL (*)    Ketones, ur 15 (*)    Protein, ur 100 (*)    Nitrite POSITIVE (*)    Leukocytes, UA MODERATE (*)    All other components within normal limits  URINE MICROSCOPIC-ADD ON - Abnormal; Notable for the following:    Squamous Epithelial / LPF 0-5 (*)    Bacteria, UA FEW (*)    All other components within normal limits  CBC WITH DIFFERENTIAL/PLATELET  PREGNANCY, URINE  COMPREHENSIVE METABOLIC PANEL    Imaging Review No results found. I have personally reviewed and evaluated these images and lab results  as part of my medical decision-making.   Patient be treated for urinary tract infection.  Told to return here as needed.  Patient agrees the plan and all questions were answered.  Advised her to follow-up with her primary care doctor.  Patient is also advised to increase her fluid intake and rest as much as possible.  Patient has a history of similar infections without hematuria.  The patient does not have any flank or abdominal pain at this time.  Patient is advised plan and all questions were answered.  Did have the patient go over the instructions.  Dalia Heading, PA-C 01/30/16 [place], PA-C 01/30/16 1616  Harvel Quale, MD 01/30/16 [phone]

## 2016-01-30 NOTE — ED Notes (Signed)
Pt placed on auto vitals Q30.  

## 2016-01-30 NOTE — ED Notes (Signed)
PA student at bedside discussing test results and dispo plan of care.

## 2016-01-30 NOTE — ED Notes (Signed)
Pt reports excessive vaginal bleeding this morning, sudden onset, pt is sure that this is coming from her vagina but also states "my urine is red too, I have had lots of clots." Pt reports partial hysterectomy. Recent rectal surgery. Pt reports feeling dizzy.

## 2016-01-30 NOTE — Discharge Instructions (Signed)
Return here as needed.  Follow-up with your primary care Dr. increase your fluid intake and rest as much as possible °

## 2016-02-01 ENCOUNTER — Encounter (HOSPITAL_COMMUNITY): Payer: Self-pay | Admitting: Internal Medicine

## 2016-02-18 ENCOUNTER — Telehealth: Payer: Self-pay | Admitting: Internal Medicine

## 2016-02-18 NOTE — Telephone Encounter (Signed)
I have not received anything from the pharmacy. I called Scott at [facility] and he said he would fax the info to me. Pt has samples at the front desk. They have been there since the procedure day. I tried to call the pt, NA, LMOM asking her to come by the office and pick up samples and that I have not gotten anything from the pharmacy. And I reminded her of her appt Thursday with [facility].

## 2016-02-18 NOTE — Telephone Encounter (Signed)
Pt called to say that she had a procedure done on 4/19 and her pharmacy doesn't have her prescription of Dexilant yet. She was suppose to get samples per her d/c instructions, but she is saying that she has been without and doesn't know the name of the prescription. Please advise if we are working on a PA or if the prescription just needs to be called in. [phone]  I transferred her call to nurses' VM

## 2016-02-20 ENCOUNTER — Other Ambulatory Visit (HOSPITAL_COMMUNITY): Payer: Self-pay | Admitting: Internal Medicine

## 2016-02-21 ENCOUNTER — Telehealth: Payer: Self-pay | Admitting: Gastroenterology

## 2016-02-21 ENCOUNTER — Other Ambulatory Visit (HOSPITAL_COMMUNITY): Payer: Self-pay | Admitting: Internal Medicine

## 2016-02-21 ENCOUNTER — Ambulatory Visit: Payer: BLUE CROSS/BLUE SHIELD | Admitting: Gastroenterology

## 2016-02-21 ENCOUNTER — Encounter: Payer: Self-pay | Admitting: Gastroenterology

## 2016-02-21 DIAGNOSIS — R935 Abnormal findings on diagnostic imaging of other abdominal regions, including retroperitoneum: Secondary | ICD-10-CM

## 2016-02-21 NOTE — Telephone Encounter (Signed)
Patient was a no show and letter sent  °

## 2016-02-25 ENCOUNTER — Ambulatory Visit (INDEPENDENT_AMBULATORY_CARE_PROVIDER_SITE_OTHER): Payer: Self-pay | Admitting: Neurology

## 2016-02-25 DIAGNOSIS — G4489 Other headache syndrome: Secondary | ICD-10-CM

## 2016-02-25 NOTE — Progress Notes (Signed)
No show

## 2016-02-26 ENCOUNTER — Ambulatory Visit (HOSPITAL_COMMUNITY)
Admission: RE | Admit: 2016-02-26 | Discharge: 2016-02-26 | Disposition: A | Discharge status: Home / Self Care | Payer: BLUE CROSS/BLUE SHIELD | Source: Ambulatory Visit | Attending: Internal Medicine | Admitting: Internal Medicine

## 2016-02-26 ENCOUNTER — Encounter: Payer: Self-pay | Admitting: Neurology

## 2016-02-26 DIAGNOSIS — R935 Abnormal findings on diagnostic imaging of other abdominal regions, including retroperitoneum: Secondary | ICD-10-CM | POA: Diagnosis present

## 2016-02-26 DIAGNOSIS — R932 Abnormal findings on diagnostic imaging of liver and biliary tract: Secondary | ICD-10-CM | POA: Diagnosis not present

## 2016-02-26 DIAGNOSIS — Z9049 Acquired absence of other specified parts of digestive tract: Secondary | ICD-10-CM | POA: Insufficient documentation

## 2016-02-29 ENCOUNTER — Ambulatory Visit (INDEPENDENT_AMBULATORY_CARE_PROVIDER_SITE_OTHER): Payer: BLUE CROSS/BLUE SHIELD | Admitting: Neurology

## 2016-02-29 ENCOUNTER — Encounter: Payer: Self-pay | Admitting: Neurology

## 2016-02-29 VITALS — BP 135/90 | HR 86 | Ht 70.0 in | Wt 262.0 lb

## 2016-02-29 DIAGNOSIS — G932 Benign intracranial hypertension: Secondary | ICD-10-CM

## 2016-02-29 MED ORDER — ACETAZOLAMIDE ER 500 MG PO CP12: 500.0000 mg | ORAL_CAPSULE | Freq: Three times a day (TID) | ORAL | Status: DC | PRN
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
## 2016-02-29 NOTE — Patient Instructions (Addendum)
-   discussed bariatric surgery for weight loss which I feel at this time would be medically indicated. Will refer to a bariatric center and/or dietician - discussed a shunt, would hold off until ophthalmology examination and medication management - Topamax is a lot weakner than Diamox, Diamox(acetazolamide) is first line, need to start and may need to increase to a high dose. - Start Diamox 500mg  two times a day then increase to three times a day in a few weeks - see her back in 4 weeks - Ophthalmology referral to Dr. Midge Aver for vision testing/formal visual fields  I would like to see you back in 4 weeks, sooner if we need to. Please call us with any interim questions, concerns, problems, updates or refill requests.   Our phone number is [phone]. We also have an after hours call service for urgent matters and there is a physician on-call for urgent questions. For any emergencies you know to call 911 or go to the nearest emergency room

## 2016-03-01 LAB — BASIC METABOLIC PANEL
BUN / CREAT RATIO: 19 (ref 9–23)
BUN: 14 mg/dL (ref 6–20)
CO2: 19 mmol/L (ref 18–29)
Calcium: 8.8 mg/dL (ref 8.7–10.2)
Chloride: 107 mmol/L — ABNORMAL HIGH (ref 96–106)
Creatinine, Ser: 0.75 mg/dL (ref 0.57–1.00)
GFR calc Af Amer: 117 mL/min/{1.73_m2} (ref >59)
GFR, EST NON AFRICAN AMERICAN: 101 mL/min/{1.73_m2} (ref >59)
Glucose: 98 mg/dL (ref 65–99)
Potassium: 4.4 mmol/L (ref 3.5–5.2)
SODIUM: 141 mmol/L (ref 134–144)

## 2016-03-03 DIAGNOSIS — G932 Benign intracranial hypertension: Secondary | ICD-10-CM | POA: Insufficient documentation

## 2016-03-04 ENCOUNTER — Telehealth: Payer: Self-pay | Admitting: *Deleted

## 2016-03-04 ENCOUNTER — Telehealth: Payer: Self-pay | Admitting: Neurology

## 2016-03-04 NOTE — Telephone Encounter (Signed)
Called and spoke to pt about normal labs per Dr Ahern. Pt verbalized understanding.  

## 2016-03-04 NOTE — Telephone Encounter (Signed)
-----   Message from Melvenia Beam, MD sent at 03/03/2016  6:44 PM EDT ----- Labs normal thanks

## 2016-03-04 NOTE — Telephone Encounter (Addendum)
Called and spoke to pt. Advised she will have to sign a record release form for me to send notes to [facility]. She will come by tomorrow to sign form. Her husband was having car troubles today and she cannot come today. I advised I will place up front for her to sign. She verbalized understanding.  I forwarded Dr Cathren Laine last OV note to PCP via EPIC.

## 2016-03-04 NOTE — Telephone Encounter (Signed)
Patient called requesting office notes from 02/29/16 visit, for the reason why patient needs this surgery, "for medical reasons, is not enough, they need to know the reason why" to be sent to [facility], Fax# [phone] and also to PCP Dr. Maudie Mercury.

## 2016-03-05 NOTE — Telephone Encounter (Signed)
Faxed office note to [facility] as requested to fax number below. Received confirmation.

## 2016-03-06 ENCOUNTER — Telehealth: Payer: Self-pay | Admitting: *Deleted

## 2016-03-06 NOTE — Telephone Encounter (Signed)
Message For: OFFICE               Taken  1-JUN-17 at 12:38PM by [facility] ------------------------------------------------------------ Netty Starring               CID [id number]  Patient SAME                 Pt's Dr NOT SURE     Area Code 336 Phone# [phone] * DOB 1975/10/12     RE NEEDS TO KNOW IF HER PAPER WORK HAS BEEN SENT     TO "[facility]",NEEDS TO BE SENT THIS WEEK            Disp:Y/N N If Y = C/B If No Response In 23minutes ============================================================ LT voicemail for pt to call the office.

## 2016-03-10 ENCOUNTER — Other Ambulatory Visit: Payer: Self-pay | Admitting: Neurology

## 2016-03-10 MED ORDER — ACETAZOLAMIDE ER 500 MG PO CP12: 500.0000 mg | ORAL_CAPSULE | Freq: Two times a day (BID) | ORAL | Status: DC

## 2016-03-12 ENCOUNTER — Telehealth: Payer: Self-pay | Admitting: *Deleted

## 2016-03-12 NOTE — Telephone Encounter (Signed)
Dr Jaynee Eagles- See message below Patient stopped by office today. I went out and spoke to pt. Had several questions regarding need for bariatric surgery. She stated see went and saw Dr. Katy Fitch who also supports her having bariatric surgery. She states she went to class to educate her about procedure and she stated [facility] spoke to her after the class. They told her d/t her having surgery for other reasons besides weight loss, Dr. Jaynee Eagles will have to call her insurance company to do a peer to peer to get surgery approved. Otherwise, she may have to wait up to 6 months to 1 year to have surgery. She gave me a couple pages from [facility] [place] [facility] that Dr Jaynee Eagles will have to fill out. She stated that after she has surgery it will be 6-8 weeks before she can go back to work. This is the average recovery time. She is not working right now and would rather get this taken care of now then go back to work and then have to put in notice for time off again. Advised I will give Dr Jaynee Eagles the message. Cannot give her a time frame for when this will be completed yet. Have to speak to Dr Jaynee Eagles first. She verbalized understanding.   Provider line for pt insurance company: [phone]

## 2016-03-15 NOTE — Telephone Encounter (Signed)
Let patient know that I will call her insurance this week thanks

## 2016-03-17 NOTE — Telephone Encounter (Signed)
I have spoken with [name] this morning, and per AA, advised AA will be calling her ins. co. this week.  She verbalized understanding of same/fim

## 2016-03-17 NOTE — Telephone Encounter (Signed)
Pt called, need a time frame and update on her insurance. Pt state that Disability has her out until March 28, 2016. Please advise [phone].

## 2016-03-18 ENCOUNTER — Telehealth: Payer: Self-pay | Admitting: Neurology

## 2016-03-18 NOTE — Telephone Encounter (Signed)
Patient is calling and states she will be have the Sleeve Bariatric surgery.

## 2016-03-18 NOTE — Telephone Encounter (Signed)
Dr Jaynee Eagles- Juluis Rainier. Were you able to complete that paperwork that pt dropped off last week?

## 2016-03-19 NOTE — Telephone Encounter (Signed)
I have not, remind patient it usually takes up to 14 days to complete paperwork but I am working on it and hope to have it by end of week. Very excited for her !!

## 2016-03-19 NOTE — Telephone Encounter (Signed)
Dr Kelton Pillar pt and relayed Dr Jaynee Eagles message below. She verbalized understanding. She wants to try and get surgery done while she is already out of work. Advised we will call once paperwork ready.

## 2016-03-21 NOTE — Telephone Encounter (Signed)
Called and spoke w/ pt. She agreed to come by Monday to pick up paperwork from the front desk.

## 2016-03-21 NOTE — Telephone Encounter (Signed)
Let patient know she can pick up letter and my office notes on Monday morning I can leave it at the desk.  thanks

## 2016-03-23 ENCOUNTER — Encounter: Payer: Self-pay | Admitting: Neurology

## 2016-03-23 NOTE — Telephone Encounter (Signed)
[name], Letter is in her chart. Can you print it and let patient know it is waiting for her at front desk? Thanks!

## 2016-03-24 ENCOUNTER — Telehealth: Payer: Self-pay | Admitting: Neurology

## 2016-03-24 NOTE — Telephone Encounter (Signed)
[name], we can send the letter I drafted to the contacts she provided below. We can also provide the information to her bariatric surgeon so they can try to get her pre-certified.  I called that number to insurance company, I was asked to fax my request to th the number below. We will provide the letter I wrote to her insurance company, her bariatric surgeon and the faxes below and Dr. Maudie Mercury thanks. Thanks.   [phone] - fax request  Patient called requesting office notes from 02/29/16 visit, for the reason why patient needs this surgery, "for medical reasons, is not enough, they need to know the reason why" to be sent to [facility], Fax# [phone] and also to PCP Dr. Maudie Mercury.       Please take care of this and let patient know thank you.

## 2016-03-24 NOTE — Telephone Encounter (Signed)
Faxed letter from Dr Jaynee Eagles dated 03/23/16 to PCP, Bebe Liter, and insurance company per pt request. Received fax confirmation for all three.

## 2016-03-24 NOTE — Telephone Encounter (Signed)
Pt picked up letter. Pt wondering if Dr Jaynee Eagles called insurance company to do approval to have surgery earlier than 6 months. Pt in office. Advised Dr Jaynee Eagles seeing pt right now. Pt wanted to wait for me to ask Dr Jaynee Eagles while she was here.

## 2016-03-24 NOTE — Telephone Encounter (Signed)
Pt called wanting to know if the letter has been sent today. She said the provider needs to have a peer to peer with the insurance company to override the 5 mth waiting time for surgery. She said she was not given a phone number for provider to call, she said it is on her card. She said if this could be done asap, she said disability ends this Friday.

## 2016-03-24 NOTE — Telephone Encounter (Signed)
Placed signed letter up front for pt pick up. Previously stated, pt coming today to pick up letter/notes.

## 2016-03-25 NOTE — Telephone Encounter (Addendum)
Dr Jaynee Eagles- how do you want to proceed? See below. I LVM for bariatric office where pt is established.  Called pt insurance company and spoke to [place]. to try and give information over the phone for bariatric surgery and reasons to have surgery done earlier than 6 months. They stated either doctor from bariatric surgery or Dr Jaynee Eagles have to call and do pre-cert to get surgery approved/give information. Have to call [phone]. Then they can set up a case from here.

## 2016-03-25 NOTE — Telephone Encounter (Signed)
LVM for [place] at [facility]. Gave [facility] phone number for her to call back. Advised I had a couple questions for her.

## 2016-03-25 NOTE — Telephone Encounter (Signed)
Dr Jaynee Eagles- I have tried everything I can. Can you call pt and discuss with her? She is calling wanting update on peer to peer.    Called [place] back at [facility]. She stated pt went to their office yesterday to try and have FMLA extended. They told her they cannot do this for her. Earnest Bailey says patient has to go through HR department to get 6 months waived. Process can take at least a couple months to go through all testing required.   Belinda [name] we have written requested letters by pt and faxed to PCP, [place].

## 2016-03-25 NOTE — Telephone Encounter (Signed)
Pt called, she needs to know the status of peer to peer. Please call asap

## 2016-03-25 NOTE — Telephone Encounter (Signed)
Earnest Bailey called back to our office. I was unable to pick up call. She is going to lunch for 23min. Michela Pitcher I can call back at [phone].

## 2016-03-25 NOTE — Telephone Encounter (Signed)
I spoke to patient, she wants to have the surgery done quickly due to her job and because she doesn't want to go back to work ans then take another leave of absence, she would rather do it now instead of wait the 6 months that is usual and customary for bariatric surgery. She says taking another medical leave in 6 months will make her look bad at work so she wants to do the bariatric surgery now while she is currently on medical leave for another problem.  I do think this surgery is medically indicated however there is no current medical urgency and the reason above due to her job.does not constitute medical urgency. She is not in immediate threat of losing her vision due to IIH. She will have to go through the normal channels for this operation thanks.

## 2016-03-31 ENCOUNTER — Ambulatory Visit: Payer: BLUE CROSS/BLUE SHIELD | Admitting: Neurology

## 2016-04-01 ENCOUNTER — Other Ambulatory Visit (HOSPITAL_COMMUNITY): Payer: Self-pay | Admitting: General Surgery

## 2016-04-04 ENCOUNTER — Ambulatory Visit (HOSPITAL_COMMUNITY)
Admission: RE | Admit: 2016-04-04 | Discharge: 2016-04-04 | Disposition: A | Discharge status: Home / Self Care | Payer: BLUE CROSS/BLUE SHIELD | Source: Ambulatory Visit | Attending: General Surgery | Admitting: General Surgery

## 2016-04-04 ENCOUNTER — Other Ambulatory Visit: Payer: Self-pay

## 2016-04-04 DIAGNOSIS — R9431 Abnormal electrocardiogram [ECG] [EKG]: Secondary | ICD-10-CM | POA: Insufficient documentation

## 2016-04-04 DIAGNOSIS — Z01810 Encounter for preprocedural cardiovascular examination: Secondary | ICD-10-CM | POA: Diagnosis not present

## 2016-04-04 DIAGNOSIS — Z01818 Encounter for other preprocedural examination: Secondary | ICD-10-CM | POA: Diagnosis not present

## 2016-04-04 DIAGNOSIS — I4581 Long QT syndrome: Secondary | ICD-10-CM | POA: Insufficient documentation

## 2016-05-14 ENCOUNTER — Encounter: Payer: Self-pay | Admitting: Dietician

## 2016-05-14 ENCOUNTER — Encounter: Payer: BLUE CROSS/BLUE SHIELD | Attending: General Surgery | Admitting: Dietician

## 2016-05-14 DIAGNOSIS — Z713 Dietary counseling and surveillance: Secondary | ICD-10-CM | POA: Insufficient documentation

## 2016-05-14 DIAGNOSIS — E669 Obesity, unspecified: Secondary | ICD-10-CM | POA: Diagnosis not present

## 2016-05-14 DIAGNOSIS — Z6835 Body mass index (BMI) 35.0-35.9, adult: Secondary | ICD-10-CM | POA: Insufficient documentation

## 2016-05-14 DIAGNOSIS — I10 Essential (primary) hypertension: Secondary | ICD-10-CM | POA: Diagnosis not present

## 2016-05-14 NOTE — Progress Notes (Signed)
  Pre-Op Assessment Visit:  Pre-Operative Sleeve Gastrectomy Surgery  Medical Nutrition Therapy:  Appt start time: [id number]   End time:  [id number].  Patient was seen on 05/14/2016 for Pre-Operative Nutrition Assessment. Assessment and letter of approval faxed to [facility] Bariatric Surgery Program coordinator on 2017.   Preferred Learning Style:   No preference indicated   Learning Readiness:   Ready  Handouts given during visit include:  Pre-Op Goals Bariatric Surgery Protein Shakes   During the appointment today the following Pre-Op Goals were reviewed with the patient: Maintain or lose weight as instructed by your surgeon Make healthy food choices Begin to limit portion sizes Limited concentrated sugars and fried foods Keep fat/sugar in the single digits per serving on   food labels Practice CHEWING your food  (aim for 30 chews per bite or until applesauce consistency) Practice not drinking 15 minutes before, during, and 30 minutes after each meal/snack Avoid all carbonated beverages  Avoid/limit caffeinated beverages  Avoid all sugar-sweetened beverages Consume 3 meals per day; eat every 3-5 hours Make a list of non-food related activities Aim for 64-100 ounces of FLUID daily  Aim for at least 60-80 grams of PROTEIN daily Look for a liquid protein source that contain ?15 g protein and ?5 g carbohydrate  (ex: shakes, drinks, shots)   Demonstrated degree of understanding via:  Teach Back  Teaching Method Utilized:  Visual Auditory Hands on  Barriers to learning/adherence to lifestyle change: none  Patient to call the [facility] to enroll in Pre-Op and Post-Op Nutrition Education when surgery date is scheduled.

## 2016-05-14 NOTE — Patient Instructions (Addendum)
Follow Pre-Op Goals Try Protein Shakes Call [facility] at [phone] when surgery is scheduled to enroll in Pre-Op Class  Things to remember:  Please always be honest with [place]. We want to support you!  If you have any questions or concerns in between appointments, please call or email Ferol Luz, or Margarita Grizzle.  The diet after surgery will be high protein and low in carbohydrate.  Vitamins and calcium need to be taken for the rest of your life.  Feel free to include support people in any classes or appointments.   Supplement recommendations:  Complete" Multivitamin: Sleeve Gastrectomy and RYGB patients take a double dose of MVI. LAGB patients take single dose as it is written on the package. Vitamin must be liquid or chewable but not gummy. Examples of these include Flintstones Complete and Centrum Complete. If the vitamin is bariatric-specific, take 1 dose as it is already formulated for bariatric surgery patients. Examples of these are Bariatric Advantage, Celebrate.These can be found at the [facility] and/or online.     Calcium citrate: 1500 mg/day of Calcium citrate (also chewable or liquid) is recommended for all procedures. The body is only able to absorb 500-600 mg of Calcium at one time so 3 daily doses of 500 mg are recommended. Calcium doses must be taken a minimum of 2 hours apart. Additionally, Calcium must be taken 2 hours apart from iron-containing MVI. Examples of brands include Celebrate, Bariatric Advantage, and Wellesse. These brands must be purchased online or at the [facility]. Citracal Petites is the only Calcium citrate supplement found in general grocery stores and pharmacies. This is in tablet form and may be recommended for patients who do not tolerate chewable Calcium.  Continued or added Vitamin D supplementation based on individual needs.    Vitamin B12: 300-500 mcg/day for Sleeve Gastrectomy and RYGB. Optional for LAGB patients as  stomach remains fully intact. Must be taken intramuscularly, sublingually, or inhaled nasally. Oral route is not recommended.

## 2016-05-19 ENCOUNTER — Other Ambulatory Visit: Payer: Self-pay

## 2016-05-21 MED ORDER — DEXILANT 60 MG PO CPDR
60.0000 mg | DELAYED_RELEASE_CAPSULE | Freq: Every day | ORAL | 5 refills | Status: DC
Start: 2016-05-21 — End: 2016-06-17

## 2016-05-22 ENCOUNTER — Other Ambulatory Visit: Payer: Self-pay | Admitting: Gastroenterology

## 2016-05-22 MED ORDER — DEXLANSOPRAZOLE 60 MG PO CPDR: 60.0000 mg | DELAYED_RELEASE_CAPSULE | Freq: Every day | ORAL | 1 refills | Status: DC

## 2016-05-23 ENCOUNTER — Encounter: Payer: Self-pay | Admitting: Dietician

## 2016-05-23 NOTE — Progress Notes (Signed)
Thanks

## 2016-06-10 ENCOUNTER — Encounter: Payer: BLUE CROSS/BLUE SHIELD | Attending: General Surgery

## 2016-06-10 DIAGNOSIS — E669 Obesity, unspecified: Secondary | ICD-10-CM

## 2016-06-10 DIAGNOSIS — Z713 Dietary counseling and surveillance: Secondary | ICD-10-CM | POA: Insufficient documentation

## 2016-06-10 DIAGNOSIS — I10 Essential (primary) hypertension: Secondary | ICD-10-CM | POA: Diagnosis not present

## 2016-06-10 DIAGNOSIS — Z6835 Body mass index (BMI) 35.0-35.9, adult: Secondary | ICD-10-CM | POA: Insufficient documentation

## 2016-06-10 NOTE — Progress Notes (Signed)
  Pre-Operative Nutrition Class:  Appt start time: 8850   End time:  1830.  Patient was seen on 06/10/2016 for Pre-Operative Bariatric Surgery Education at [facility].   Surgery date:  Surgery type: sleeve gastrectomy Start weight at [facility]: 261 lbs on 05/14/2016 Weight today: 263.6 lbs  TANITA  BODY COMP RESULTS  06/10/16   BMI (kg/m^2) 38.9   Fat Mass (lbs) 136   Fat Free Mass (lbs) 127.6   Total Body Water (lbs) 94.4   Samples given per MNT protocol. Patient educated on appropriate usage: Bariatric Advantage Multivitamin (mixed berry - qty 1) Lot #: [id number] Exp: 04/2017  Bariatric Advantage Calcium Citrate chew (caramel - qty 1) Lot #: [id number] Exp: 08/2016  Premier protein shake (vanilla - qty 1) Lot #: [id number] Exp: 05/2017  Renee Pain Protein Powder (chocolate - qty 1) Lot #: [id number] Exp: 07/2017  The following the learning objectives were met by the patient during this course:  Identify Pre-Op Dietary Goals and will begin 2 weeks pre-operatively  Identify appropriate sources of fluids and proteins   State protein recommendations and appropriate sources pre and post-operatively  Identify Post-Operative Dietary Goals and will follow for 2 weeks post-operatively  Identify appropriate multivitamin and calcium sources  Describe the need for physical activity post-operatively and will follow MD recommendations  State when to call healthcare provider regarding medication questions or post-operative complications  Handouts given during class include:  Pre-Op Bariatric Surgery Diet Handout  Protein Shake Handout  Post-Op Bariatric Surgery Nutrition Handout  BELT Program Information Flyer  Support Group Information Flyer  [facility] Bariatric Supplements Price List  Follow-Up Plan: Patient will follow-up at [facility] 2 weeks post operatively for diet advancement per MD.

## 2016-06-11 NOTE — Progress Notes (Signed)
Please place orders in [facility] as your patient is being scheduled for a pre-op appointment at [place].! Thank you!

## 2016-06-13 ENCOUNTER — Ambulatory Visit: Payer: Self-pay | Admitting: General Surgery

## 2016-06-13 NOTE — H&P (Signed)
[name] 06/13/2016 3:07 PM Location: [facility] Patient #: [phone] DOB: [birth year] Married / Language: English / Race: Black or African American Female  History of Present Illness [name] Hiss M. [name] Haddox MD; 06/13/2016 4:02 PM) The patient is a 40 year old female who presents for a pre-op visit. She comes in today for her preoperative visit. She has been scheduled and approved for laparoscopic sleeve gastrectomy. I initially met her June 14th. She denies any medical changes since she was initially met. She still has problems with her pseudotumor cerebri. She still takes dexilant for reflux. She denies any trips to the emergency room hospital. She denies any chest pain or chest pressure. She denies any shortness of breath. She denies any melena or hematochezia. She denies any abdominal pain. She denies any dysuria. She denies any TIAs or amaurosis fugax. Her upper GI that showed no hiatal hernia. However a EGD did show a small hiatal hernia in the past. Her initial evaluation labs were unremarkable. She did have a hemoglobin A1c of 5.9. There is no anemia or vitamin deficiency.  Review of systems-comprehensive 12 point review of systems was performed all systems are negative except for what is mentioned in the HPI   Problem List/Past Medical [name] Curry, MD; 06/13/2016 4:03 PM) BODY MASS INDEX (BMI) OF 35.0 TO 35.9 WITH COMORBIDITY (Z68.35) DYSLIPIDEMIA (E78.5) HYPERCHOLESTEREMIA (E78.00) HYPERTENSION, ESSENTIAL (I10) PREDIABETES (R73.03) FATTY LIVER (K76.0) PSEUDOTUMOR CEREBRI (G93.2)  Other Problems [name] Curry, MD; 06/13/2016 4:03 PM) GASTROESOPHAGEAL REFLUX DISEASE WITHOUT ESOPHAGITIS (K21.9) Other disease, cancer, significant illness Seizure Disorder Bladder Problems Back Pain  Past Surgical History [name] Curry, MD; 06/13/2016 4:03 PM) Gallbladder Surgery - Laparoscopic Hysterectomy (not due to cancer) - Partial  Diagnostic Studies  History [name] Curry, MD; 06/13/2016 4:03 PM) Colonoscopy within last year  Allergies ([place], [place]; 06/13/2016 3:08 PM) No Known Drug Allergies 03/19/2016  Medication History [name] Curry, MD; 06/13/2016 4:03 PM) AmLODIPine Besylate (5MG Tablet, Oral) Active. Anucort-HC (25MG Suppository, Rectal) Active. Contrave (8-90MG Tablet ER 12HR, Oral) Active. Dexilant (60MG Capsule DR, Oral) Active. Montelukast Sodium (10MG Tablet, Oral) Active. Topiramate (25MG Tablet, Oral) Active. EPINEPHrine (0.3MG/0.3ML Soln Auto-inj, Injection as needed) Active. ALPRAZolam (1MG Tablet, Oral) Active. Atorvastatin Calcium (40MG Tablet, Oral) Active. Azelastine HCl (0.05% Solution, Ophthalmic) Active. Levocetirizine Dihydrochloride (5MG Tablet, Oral) Active. MetroNIDAZOLE (0.75% Gel, Vaginal as needed) Active. Nitrofurantoin Monohyd Macro (100MG Capsule, Oral) Active. Proctosol HC (2.5% Cream, Rectal as needed) Active. Medications Reconciled OxyCODONE HCl (5MG/5ML Solution, 5-10 Milliliter Oral every four hours, as needed, Taken starting 06/13/2016) Active. Zofran ODT (4MG Tablet Disint, 1 (one) Tablet Disperse Oral every six hours, as needed, Taken starting 06/13/2016) Active.  Social History [name] Curry, MD; 06/13/2016 4:03 PM) Caffeine use Carbonated beverages, Tea. No drug use Tobacco use Never smoker. Alcohol use Occasional alcohol use.  Family History [name] Curry, MD; 06/13/2016 4:03 PM) Diabetes Mellitus Family Members In General, Mother. Heart Disease Family Members In General. Hypertension Family Members In General, Mother. Cancer Family Members In General. Migraine Headache Daughter, Mother. Thyroid problems Sister.  Pregnancy / Birth History [name] Curry, MD; 06/13/2016 4:03 PM) [name] 3 Maternal age 92-20 Para 5 Age at menarche 68 years.     Review of Systems [name] Hiss M. [name] Bevacqua MD; 06/13/2016 3:59 PM) General Present- Fatigue and Weight Gain.  Not Present- Appetite Loss, Chills, Fever, Night Sweats and Weight Loss. HEENT Present- Earache, Ringing in the Ears, Seasonal Allergies and Visual Disturbances. Not Present- Hearing Loss,  Hoarseness, Nose Bleed, Oral Ulcers, Sinus Pain, Sore Throat, Wears glasses/contact lenses and Yellow Eyes. Respiratory Present- Snoring. Not Present- Bloody sputum, Chronic Cough, Difficulty Breathing and Wheezing. Breast Not Present- Breast Mass, Breast Pain, Nipple Discharge and Skin Changes. Cardiovascular Not Present- Chest Pain, Difficulty Breathing Lying Down, Leg Cramps, Palpitations, Rapid Heart Rate, Shortness of Breath and Swelling of Extremities. Gastrointestinal Present- Hemorrhoids. Not Present- Abdominal Pain, Bloating, Bloody Stool, Change in Bowel Habits, Chronic diarrhea, Constipation, Difficulty Swallowing, Excessive gas, Gets full quickly at meals, Indigestion, Nausea, Rectal Pain and Vomiting. Female Genitourinary Not Present- Frequency, Nocturia, Painful Urination, Pelvic Pain and Urgency. Musculoskeletal Present- Back Pain. Not Present- Joint Pain, Joint Stiffness, Muscle Pain, Muscle Weakness and Swelling of Extremities. Neurological Present- Decreased Memory, Headaches and Weakness. Not Present- Fainting, Numbness, Seizures, Tingling, Tremor and Trouble walking. Psychiatric Present- Anxiety and Depression. Not Present- Bipolar, Change in Sleep Pattern, Fearful and Frequent crying. Endocrine Not Present- Cold Intolerance, Excessive Hunger, Hair Changes, Heat Intolerance, Hot flashes and New Diabetes. Hematology Not Present- Easy Bruising, Excessive bleeding, Gland problems, HIV and Persistent Infections.  Vitals ([name] CMA; 06/13/2016 3:07 PM) 06/13/2016 3:07 PM Weight: 266 lb Height: 69in Body Surface Area: 2.33 m Body Mass Index: 39.28 kg/m  Temp.: 78F(Temporal)  Pulse: 86 (Regular)  BP: 144/82 (Sitting, Left Arm, Standard)      Physical Exam [name] Hiss M. [name] MD;  06/13/2016 3:59 PM)  General Mental Status-Alert. General Appearance-Consistent with stated age. Hydration-Well hydrated. Voice-Normal. Note: obese  Head and Neck Head-normocephalic, atraumatic with no lesions or palpable masses. Trachea-midline. Thyroid Gland Characteristics - normal size and consistency.  Eye Eyeball - Bilateral-Extraocular movements intact. Sclera/Conjunctiva - Bilateral-No scleral icterus.  Chest and Lung Exam Chest and lung exam reveals -quiet, even and easy respiratory effort with no use of accessory muscles and on auscultation, normal breath sounds, no adventitious sounds and normal vocal resonance. Inspection Chest Wall - Normal. Back - normal.  Breast - Did not examine.  Cardiovascular Cardiovascular examination reveals -normal heart sounds, regular rate and rhythm with no murmurs and normal pedal pulses bilaterally.  Abdomen Inspection Inspection of the abdomen reveals - No Hernias. Skin - Scar - Note: well healed trocar scars. Palpation/Percussion Palpation and Percussion of the abdomen reveal - Soft, Non Tender, No Rebound tenderness, No Rigidity (guarding) and No hepatosplenomegaly. Auscultation Auscultation of the abdomen reveals - Bowel sounds normal.  Peripheral Vascular Upper Extremity Palpation - Pulses bilaterally normal.  Neurologic Neurologic evaluation reveals -alert and oriented x 3 with no impairment of recent or remote memory. Mental Status-Normal.  Neuropsychiatric The patient's mood and affect are described as -normal. Judgment and Insight-insight is appropriate concerning matters relevant to self.  Musculoskeletal Normal Exam - Left-Upper Extremity Strength Normal and Lower Extremity Strength Normal. Normal Exam - Right-Upper Extremity Strength Normal and Lower Extremity Strength Normal.  Lymphatic Head & Neck  General Head & Neck Lymphatics: Bilateral - Description - Normal. Axillary  - Did not examine. Femoral & Inguinal - Did not examine.    Assessment & Plan [name] Hiss M. Kairi Tufo MD; 06/13/2016 4:03 PM)  BODY MASS INDEX (BMI) OF 35.0 TO 35.9 WITH COMORBIDITY (Z68.35) Impression: We discussed her preoperative workup. We reviewed her evaluation labs which were not terribly abnormal. She did have an hemoglobin A1c of 5.9 which is consistent with prediabetes. We discussed the typical recovery period. Her upper GI showed no hiatal hernia but upper endoscopy within the past year or 2 showed a small hiatal hernia. Since she has reflux we  will test her for a hiatal hernia intraoperatively and if it is present though ahead and repaired at the time. We discussed with that would involve. We discussed the preoperative diet plan. We discussed the typical postoperative recovery course as well as the initial postoperative meal plan. All of her questions were asked and answered. She was instructed to call the office should she have any questions between now and surgery. Because of her work I believe she will need to be out of work for at least 6 weeks. She only gets to 15 minute breaks and one 30 minute break for a 10-12 hour shift. Given her new anatomy after surgery and inability to eat rapidly as well as the abdominal wall incision for the sleeve gastrectomy extraction site doubly keeping her out of work for 6 weeks will be the best thing for her in order to minimize postoperative complications as well as potential need for readmission for dehydration. When she returns to work at 6 weeks to think she will need to have access to water and protein shakes at her work station.  Current Plans Pt Education - EMW_preopbariatric DYSLIPIDEMIA (E78.5)  HYPERTENSION, ESSENTIAL (I10)  PSEUDOTUMOR CEREBRI (G93.2)  FATTY LIVER (K76.0)  PREDIABETES (R73.03)  Leighton Ruff. Redmond Pulling, MD, FACS General, Bariatric, & Minimally Invasive Surgery [facility], [place]

## 2016-06-19 ENCOUNTER — Encounter (HOSPITAL_COMMUNITY)
Admission: RE | Admit: 2016-06-19 | Discharge: 2016-06-19 | Disposition: A | Discharge status: Home / Self Care | Payer: BLUE CROSS/BLUE SHIELD | Source: Ambulatory Visit | Attending: General Surgery | Admitting: General Surgery

## 2016-06-19 ENCOUNTER — Encounter (HOSPITAL_COMMUNITY): Payer: Self-pay

## 2016-06-19 DIAGNOSIS — Z01812 Encounter for preprocedural laboratory examination: Secondary | ICD-10-CM | POA: Diagnosis present

## 2016-06-19 HISTORY — DX: Personal history of urinary (tract) infections: Z87.440

## 2016-06-19 HISTORY — DX: Unspecified visual disturbance: H53.9

## 2016-06-19 HISTORY — DX: Personal history of other venous thrombosis and embolism: Z86.718

## 2016-06-19 HISTORY — DX: Unspecified convulsions: R56.9

## 2016-06-19 HISTORY — DX: Angina pectoris, unspecified: I20.9

## 2016-06-19 HISTORY — DX: Personal history of other diseases of the digestive system: Z87.19

## 2016-06-19 HISTORY — DX: Prediabetes: R73.03

## 2016-06-19 HISTORY — DX: Anemia, unspecified: D64.9

## 2016-06-19 LAB — COMPREHENSIVE METABOLIC PANEL
ALBUMIN: 4 g/dL (ref 3.5–5.0)
ALK PHOS: 60 U/L (ref 38–126)
ALT: 68 U/L — ABNORMAL HIGH (ref 14–54)
AST: 63 U/L — AB (ref 15–41)
Anion gap: 5 (ref 5–15)
BILIRUBIN TOTAL: 0.4 mg/dL (ref 0.3–1.2)
BUN: 12 mg/dL (ref 6–20)
CO2: 28 mmol/L (ref 22–32)
Calcium: 8.7 mg/dL — ABNORMAL LOW (ref 8.9–10.3)
Chloride: 104 mmol/L (ref 101–111)
Creatinine, Ser: 0.71 mg/dL (ref 0.44–1.00)
GFR calc Af Amer: >60 mL/min (ref >60)
GFR calc non Af Amer: >60 mL/min (ref >60)
GLUCOSE: 168 mg/dL — AB (ref 65–99)
POTASSIUM: 3.4 mmol/L — AB (ref 3.5–5.1)
Sodium: 137 mmol/L (ref 135–145)
TOTAL PROTEIN: 7.8 g/dL (ref 6.5–8.1)

## 2016-06-19 LAB — CBC WITH DIFFERENTIAL/PLATELET
BASOS ABS: 0 10*3/uL (ref 0.0–0.1)
BASOS PCT: 0 %
Eosinophils Absolute: 0.2 10*3/uL (ref 0.0–0.7)
Eosinophils Relative: 4 %
HEMATOCRIT: 40.4 % (ref 36.0–46.0)
HEMOGLOBIN: 13.3 g/dL (ref 12.0–15.0)
Lymphocytes Relative: 32 %
Lymphs Abs: 1.9 10*3/uL (ref 0.7–4.0)
MCH: 28.8 pg (ref 26.0–34.0)
MCHC: 32.9 g/dL (ref 30.0–36.0)
MCV: 87.4 fL (ref 78.0–100.0)
Monocytes Absolute: 0.4 10*3/uL (ref 0.1–1.0)
Monocytes Relative: 7 %
NEUTROS ABS: 3.4 10*3/uL (ref 1.7–7.7)
NEUTROS PCT: 57 %
Platelets: 373 10*3/uL (ref 150–400)
RBC: 4.62 MIL/uL (ref 3.87–5.11)
RDW: 13 % (ref 11.5–15.5)
WBC: 6 10*3/uL (ref 4.0–10.5)

## 2016-06-19 NOTE — Progress Notes (Signed)
Spoke with Dr Hubbard Robinson in regards to pts H&P. No orders given. Anesthesia to see pt day of surgery.

## 2016-06-19 NOTE — Patient Instructions (Signed)
[name]  06/19/2016   Your procedure is scheduled on: Monday June 23, 2016  Report to [facility]  Entrance take [place]  elevators to 3rd floor to  [place] at 7:45 AM.  Call this number if you have problems the morning of surgery [phone]   Remember: ONLY 1 PERSON MAY GO WITH YOU TO SHORT STAY TO GET  READY MORNING OF [place].  Do not eat food or drink liquids :After Midnight.     Take these medicines the morning of surgery with A SIP OF WATER: Alprazolam (Xanax) if needed; Amlodipine (Norvasc); May use eye drops if needed; Montelukast (Singulair); Dexlansoprazole (Dexilant); Flonase Nasal Spray; Topiramate (Topramax); Levocetrizine (Xyzal)                                 You may not have any metal on your body including hair pins and              piercings  Do not wear jewelry, make-up, lotions, powders or perfumes, deodorant             Do not wear nail polish.  Do not shave  48 hours prior to surgery.     Do not bring valuables to the hospital. [place].  Contacts, dentures or bridgework may not be worn into surgery.  Leave suitcase in the car. After surgery it may be brought to your room.   _____________________________________________________________________             [facility] - Preparing for Surgery Before surgery, you can play an important role.  Because skin is not sterile, your skin needs to be as free of germs as possible.  You can reduce the number of germs on your skin by washing with CHG (chlorahexidine gluconate) soap before surgery.  CHG is an antiseptic cleaner which kills germs and bonds with the skin to continue killing germs even after washing. Please DO NOT use if you have an allergy to CHG or antibacterial soaps.  If your skin becomes reddened/irritated stop using the CHG and inform your nurse when you arrive at Short Stay. Do not shave (including  legs and underarms) for at least 48 hours prior to the first CHG shower.  You may shave your face/neck. Please follow these instructions carefully:  1.  Shower with CHG Soap the night before surgery and the  morning of Surgery.  2.  If you choose to wash your hair, wash your hair first as usual with your  normal  shampoo.  3.  After you shampoo, rinse your hair and body thoroughly to remove the  shampoo.                           4.  Use CHG as you would any other liquid soap.  You can apply chg directly  to the skin and wash                       Gently with a scrungie or clean washcloth.  5.  Apply the CHG Soap to your body ONLY FROM THE NECK DOWN.   Do not use on face/ open  Wound or open sores. Avoid contact with eyes, ears mouth and genitals (private parts).                       Wash face,  Genitals (private parts) with your normal soap.             6.  Wash thoroughly, paying special attention to the area where your surgery  will be performed.  7.  Thoroughly rinse your body with warm water from the neck down.  8.  DO NOT shower/wash with your normal soap after using and rinsing off  the CHG Soap.                9.  Pat yourself dry with a clean towel.            10.  Wear clean pajamas.            11.  Place clean sheets on your bed the night of your first shower and do not  sleep with pets. Day of Surgery : Do not apply any lotions/deodorants the morning of surgery.  Please wear clean clothes to the hospital/surgery center.  FAILURE TO FOLLOW THESE INSTRUCTIONS MAY RESULT IN THE CANCELLATION OF YOUR SURGERY PATIENT SIGNATURE_________________________________  NURSE SIGNATURE__________________________________  ________________________________________________________________________

## 2016-06-20 ENCOUNTER — Encounter (HOSPITAL_COMMUNITY): Payer: Self-pay

## 2016-06-20 LAB — HEMOGLOBIN A1C
Hgb A1c MFr Bld: 6 % — ABNORMAL HIGH (ref 4.8–5.6)
MEAN PLASMA GLUCOSE: 126 mg/dL

## 2016-06-20 NOTE — Progress Notes (Signed)
Your patient has screened at an elevated risk for Obstructive Sleep Apnea using the Stop-Bang Tool during a pre-surgical visit. Your patient scored at high risk.

## 2016-06-20 NOTE — Progress Notes (Addendum)
CMP results in epic per PAT visit 06/19/2016 sent to Dr Redmond Pulling

## 2016-06-23 ENCOUNTER — Encounter (HOSPITAL_COMMUNITY)
Admission: RE | Disposition: A | Discharge status: Home / Self Care | Payer: Self-pay | Source: Ambulatory Visit | Attending: General Surgery

## 2016-06-23 ENCOUNTER — Inpatient Hospital Stay (HOSPITAL_COMMUNITY): Payer: BLUE CROSS/BLUE SHIELD | Admitting: Certified Registered Nurse Anesthetist

## 2016-06-23 ENCOUNTER — Inpatient Hospital Stay (HOSPITAL_COMMUNITY)
Admission: RE | Admit: 2016-06-23 | Discharge: 2016-06-26 | DRG: 621 | Disposition: A | Discharge status: Home / Self Care | Payer: BLUE CROSS/BLUE SHIELD | Source: Ambulatory Visit | Attending: General Surgery | Admitting: General Surgery

## 2016-06-23 ENCOUNTER — Encounter (HOSPITAL_COMMUNITY): Payer: Self-pay | Admitting: *Deleted

## 2016-06-23 DIAGNOSIS — Z6839 Body mass index (BMI) 39.0-39.9, adult: Secondary | ICD-10-CM | POA: Diagnosis not present

## 2016-06-23 DIAGNOSIS — K760 Fatty (change of) liver, not elsewhere classified: Secondary | ICD-10-CM | POA: Diagnosis present

## 2016-06-23 DIAGNOSIS — Z833 Family history of diabetes mellitus: Secondary | ICD-10-CM

## 2016-06-23 DIAGNOSIS — G932 Benign intracranial hypertension: Secondary | ICD-10-CM | POA: Diagnosis present

## 2016-06-23 DIAGNOSIS — K219 Gastro-esophageal reflux disease without esophagitis: Secondary | ICD-10-CM | POA: Diagnosis present

## 2016-06-23 DIAGNOSIS — K449 Diaphragmatic hernia without obstruction or gangrene: Secondary | ICD-10-CM

## 2016-06-23 DIAGNOSIS — E669 Obesity, unspecified: Secondary | ICD-10-CM | POA: Diagnosis present

## 2016-06-23 DIAGNOSIS — E782 Mixed hyperlipidemia: Secondary | ICD-10-CM | POA: Diagnosis present

## 2016-06-23 DIAGNOSIS — Z79899 Other long term (current) drug therapy: Secondary | ICD-10-CM

## 2016-06-23 DIAGNOSIS — Z90711 Acquired absence of uterus with remaining cervical stump: Secondary | ICD-10-CM | POA: Diagnosis not present

## 2016-06-23 DIAGNOSIS — E781 Pure hyperglyceridemia: Secondary | ICD-10-CM | POA: Diagnosis present

## 2016-06-23 DIAGNOSIS — Z9884 Bariatric surgery status: Secondary | ICD-10-CM

## 2016-06-23 DIAGNOSIS — E7800 Pure hypercholesterolemia, unspecified: Secondary | ICD-10-CM | POA: Diagnosis present

## 2016-06-23 DIAGNOSIS — R112 Nausea with vomiting, unspecified: Secondary | ICD-10-CM | POA: Diagnosis not present

## 2016-06-23 DIAGNOSIS — J45909 Unspecified asthma, uncomplicated: Secondary | ICD-10-CM | POA: Diagnosis present

## 2016-06-23 DIAGNOSIS — R7303 Prediabetes: Secondary | ICD-10-CM | POA: Diagnosis present

## 2016-06-23 DIAGNOSIS — I10 Essential (primary) hypertension: Secondary | ICD-10-CM | POA: Diagnosis present

## 2016-06-23 DIAGNOSIS — Z8249 Family history of ischemic heart disease and other diseases of the circulatory system: Secondary | ICD-10-CM

## 2016-06-23 HISTORY — PX: HIATAL HERNIA REPAIR: SHX195

## 2016-06-23 HISTORY — PX: LAPAROSCOPIC GASTRIC SLEEVE RESECTION: SHX5895

## 2016-06-23 LAB — HEMOGLOBIN AND HEMATOCRIT, BLOOD
HCT: 37.2 % (ref 36.0–46.0)
Hemoglobin: 12.7 g/dL (ref 12.0–15.0)

## 2016-06-23 LAB — GLUCOSE, CAPILLARY: GLUCOSE-CAPILLARY: 121 mg/dL — AB (ref 65–99)

## 2016-06-23 LAB — PREGNANCY, URINE: Preg Test, Ur: NEGATIVE

## 2016-06-23 SURGERY — GASTRECTOMY, SLEEVE, LAPAROSCOPIC
Anesthesia: General | Site: Abdomen

## 2016-06-23 MED ORDER — HEPARIN SODIUM (PORCINE) 5000 UNIT/ML IJ SOLN
5000.0000 [IU] | INTRAMUSCULAR | Status: AC
  Administered 2016-06-23: 5000 [IU] via SUBCUTANEOUS
  Filled 2016-06-23: qty 1

## 2016-06-23 MED ORDER — SUCCINYLCHOLINE CHLORIDE 200 MG/10ML IV SOSY
PREFILLED_SYRINGE | INTRAVENOUS | Status: DC | PRN
  Administered 2016-06-23: 140 mg via INTRAVENOUS

## 2016-06-23 MED ORDER — ACETAMINOPHEN 10 MG/ML IV SOLN
INTRAVENOUS | Status: AC
  Filled 2016-06-23: qty 100

## 2016-06-23 MED ORDER — FENTANYL CITRATE (PF) 100 MCG/2ML IJ SOLN
INTRAMUSCULAR | Status: AC
  Filled 2016-06-23: qty 2

## 2016-06-23 MED ORDER — KETOTIFEN FUMARATE 0.025 % OP SOLN
1.0000 [drp] | Freq: Two times a day (BID) | OPHTHALMIC | Status: DC
  Administered 2016-06-23 – 2016-06-26 (×6): 1 [drp] via OPHTHALMIC
  Filled 2016-06-23: qty 5

## 2016-06-23 MED ORDER — HYDROMORPHONE HCL 1 MG/ML IJ SOLN
0.2500 mg | INTRAMUSCULAR | Status: DC | PRN
  Administered 2016-06-23 (×4): 0.5 mg via INTRAVENOUS

## 2016-06-23 MED ORDER — PROMETHAZINE HCL 25 MG/ML IJ SOLN
12.5000 mg | Freq: Four times a day (QID) | INTRAMUSCULAR | Status: DC | PRN
  Administered 2016-06-24 – 2016-06-25 (×3): 12.5 mg via INTRAVENOUS
  Filled 2016-06-23 (×3): qty 1

## 2016-06-23 MED ORDER — FAMOTIDINE IN NACL 20-0.9 MG/50ML-% IV SOLN
20.0000 mg | Freq: Two times a day (BID) | INTRAVENOUS | Status: DC
  Administered 2016-06-23 – 2016-06-26 (×6): 20 mg via INTRAVENOUS
  Filled 2016-06-23 (×7): qty 50

## 2016-06-23 MED ORDER — ACETAMINOPHEN 160 MG/5ML PO SOLN
650.0000 mg | ORAL | Status: DC | PRN
  Administered 2016-06-24: 650 mg via ORAL
  Filled 2016-06-23: qty 20.3

## 2016-06-23 MED ORDER — APREPITANT 40 MG PO CAPS
40.0000 mg | ORAL_CAPSULE | ORAL | Status: AC
  Administered 2016-06-23: 40 mg via ORAL
  Filled 2016-06-23: qty 1

## 2016-06-23 MED ORDER — ONDANSETRON HCL 4 MG/2ML IJ SOLN
4.0000 mg | INTRAMUSCULAR | Status: DC | PRN
Start: 2016-06-23 — End: 2016-06-26
  Administered 2016-06-23 – 2016-06-25 (×4): 4 mg via INTRAVENOUS
  Filled 2016-06-23 (×5): qty 2

## 2016-06-23 MED ORDER — PHENYLEPHRINE 40 MCG/ML (10ML) SYRINGE FOR IV PUSH (FOR BLOOD PRESSURE SUPPORT)
PREFILLED_SYRINGE | INTRAVENOUS | Status: DC | PRN
  Administered 2016-06-23 (×2): 80 ug via INTRAVENOUS

## 2016-06-23 MED ORDER — FENTANYL CITRATE (PF) 100 MCG/2ML IJ SOLN
25.0000 ug | INTRAMUSCULAR | Status: DC | PRN
  Administered 2016-06-23 (×3): 100 ug via INTRAVENOUS
  Administered 2016-06-23: 25 ug via INTRAVENOUS
  Administered 2016-06-23 – 2016-06-24 (×5): 100 ug via INTRAVENOUS
  Filled 2016-06-23 (×10): qty 2

## 2016-06-23 MED ORDER — INSULIN ASPART 100 UNIT/ML ~~LOC~~ SOLN: SUBCUTANEOUS | Status: DC | PRN

## 2016-06-23 MED ORDER — LIDOCAINE 2% (20 MG/ML) 5 ML SYRINGE
INTRAMUSCULAR | Status: AC
  Filled 2016-06-23: qty 5

## 2016-06-23 MED ORDER — CHLORHEXIDINE GLUCONATE 4 % EX LIQD: 60.0000 mL | Freq: Once | CUTANEOUS | Status: DC

## 2016-06-23 MED ORDER — FENTANYL CITRATE (PF) 250 MCG/5ML IJ SOLN
INTRAMUSCULAR | Status: AC
  Filled 2016-06-23: qty 5

## 2016-06-23 MED ORDER — ONDANSETRON HCL 4 MG/2ML IJ SOLN
INTRAMUSCULAR | Status: AC
  Filled 2016-06-23: qty 2

## 2016-06-23 MED ORDER — LACTATED RINGERS IR SOLN
Status: DC | PRN
Start: 2016-06-23 — End: 2016-06-23
  Administered 2016-06-23: 1000 mL

## 2016-06-23 MED ORDER — DIPHENHYDRAMINE HCL 50 MG/ML IJ SOLN: 12.5000 mg | Freq: Three times a day (TID) | INTRAMUSCULAR | Status: DC | PRN

## 2016-06-23 MED ORDER — ROCURONIUM BROMIDE 100 MG/10ML IV SOLN
INTRAVENOUS | Status: AC
  Filled 2016-06-23: qty 1

## 2016-06-23 MED ORDER — 0.9 % SODIUM CHLORIDE (POUR BTL) OPTIME
TOPICAL | Status: DC | PRN
  Administered 2016-06-23: 1000 mL

## 2016-06-23 MED ORDER — FENTANYL CITRATE (PF) 100 MCG/2ML IJ SOLN
INTRAMUSCULAR | Status: DC | PRN
Start: 2016-06-23 — End: 2016-06-23
  Administered 2016-06-23 (×3): 50 ug via INTRAVENOUS
  Administered 2016-06-23: 100 ug via INTRAVENOUS
  Administered 2016-06-23 (×2): 50 ug via INTRAVENOUS

## 2016-06-23 MED ORDER — ONDANSETRON HCL 4 MG/2ML IJ SOLN
4.0000 mg | Freq: Four times a day (QID) | INTRAMUSCULAR | Status: AC | PRN
  Administered 2016-06-23: 4 mg via INTRAVENOUS

## 2016-06-23 MED ORDER — ENOXAPARIN SODIUM 30 MG/0.3ML ~~LOC~~ SOLN
30.0000 mg | Freq: Two times a day (BID) | SUBCUTANEOUS | Status: DC
  Administered 2016-06-24 – 2016-06-26 (×5): 30 mg via SUBCUTANEOUS
  Filled 2016-06-23 (×4): qty 0.3

## 2016-06-23 MED ORDER — BUPIVACAINE LIPOSOME 1.3 % IJ SUSP
20.0000 mL | Freq: Once | INTRAMUSCULAR | Status: AC
  Administered 2016-06-23: 20 mL
  Filled 2016-06-23: qty 20

## 2016-06-23 MED ORDER — ACETAMINOPHEN 10 MG/ML IV SOLN
1000.0000 mg | Freq: Four times a day (QID) | INTRAVENOUS | Status: AC
  Administered 2016-06-23 – 2016-06-24 (×4): 1000 mg via INTRAVENOUS
  Filled 2016-06-23 (×3): qty 100

## 2016-06-23 MED ORDER — ACETAMINOPHEN 10 MG/ML IV SOLN
INTRAVENOUS | Status: AC
Start: 2016-06-23 — End: 2016-06-23
  Filled 2016-06-23: qty 100

## 2016-06-23 MED ORDER — ACETAMINOPHEN 10 MG/ML IV SOLN
1000.0000 mg | Freq: Once | INTRAVENOUS | Status: AC
  Administered 2016-06-23: 1000 mg via INTRAVENOUS

## 2016-06-23 MED ORDER — ORAL CARE MOUTH RINSE
15.0000 mL | Freq: Two times a day (BID) | OROMUCOSAL | Status: DC
  Administered 2016-06-23 – 2016-06-26 (×6): 15 mL via OROMUCOSAL

## 2016-06-23 MED ORDER — PROPOFOL 10 MG/ML IV BOLUS
INTRAVENOUS | Status: DC | PRN
  Administered 2016-06-23: 200 mg via INTRAVENOUS

## 2016-06-23 MED ORDER — SODIUM CHLORIDE 0.9 % IJ SOLN
INTRAMUSCULAR | Status: DC | PRN
  Administered 2016-06-23: 50 mL

## 2016-06-23 MED ORDER — CEFOTETAN DISODIUM-DEXTROSE 2-2.08 GM-% IV SOLR
2.0000 g | INTRAVENOUS | Status: AC
  Administered 2016-06-23: 2 g via INTRAVENOUS

## 2016-06-23 MED ORDER — KETOROLAC TROMETHAMINE 15 MG/ML IJ SOLN
15.0000 mg | Freq: Once | INTRAMUSCULAR | Status: AC
  Administered 2016-06-23: 15 mg via INTRAVENOUS
  Filled 2016-06-23: qty 1

## 2016-06-23 MED ORDER — CEFOTETAN DISODIUM-DEXTROSE 2-2.08 GM-% IV SOLR
INTRAVENOUS | Status: AC
  Filled 2016-06-23: qty 50

## 2016-06-23 MED ORDER — ROCURONIUM BROMIDE 10 MG/ML (PF) SYRINGE
PREFILLED_SYRINGE | INTRAVENOUS | Status: DC | PRN
  Administered 2016-06-23: 50 mg via INTRAVENOUS
  Administered 2016-06-23 (×3): 10 mg via INTRAVENOUS

## 2016-06-23 MED ORDER — SODIUM CHLORIDE 0.9 % IJ SOLN
INTRAMUSCULAR | Status: AC
  Filled 2016-06-23: qty 50

## 2016-06-23 MED ORDER — KCL IN DEXTROSE-NACL 20-5-0.45 MEQ/L-%-% IV SOLN
INTRAVENOUS | Status: DC
  Administered 2016-06-23 – 2016-06-24 (×2): via INTRAVENOUS
  Administered 2016-06-24: 1000 mL via INTRAVENOUS
  Administered 2016-06-24 – 2016-06-25 (×2): via INTRAVENOUS
  Filled 2016-06-23 (×8): qty 1000

## 2016-06-23 MED ORDER — ONDANSETRON HCL 4 MG/2ML IJ SOLN
INTRAMUSCULAR | Status: DC | PRN
  Administered 2016-06-23: 4 mg via INTRAVENOUS

## 2016-06-23 MED ORDER — PREMIER PROTEIN SHAKE
2.0000 [oz_av] | ORAL | Status: DC
  Administered 2016-06-25 – 2016-06-26 (×13): 2 [oz_av] via ORAL
  Filled 2016-06-23: qty 325.31

## 2016-06-23 MED ORDER — STERILE WATER FOR IRRIGATION IR SOLN
Status: DC | PRN
  Administered 2016-06-23: 2000 mL

## 2016-06-23 MED ORDER — DEXAMETHASONE SODIUM PHOSPHATE 10 MG/ML IJ SOLN
INTRAMUSCULAR | Status: DC | PRN
  Administered 2016-06-23: 10 mg via INTRAVENOUS

## 2016-06-23 MED ORDER — ACETAMINOPHEN 160 MG/5ML PO SOLN: 325.0000 mg | ORAL | Status: DC | PRN

## 2016-06-23 MED ORDER — LIDOCAINE 2% (20 MG/ML) 5 ML SYRINGE
INTRAMUSCULAR | Status: DC | PRN
  Administered 2016-06-23: 100 mg via INTRAVENOUS

## 2016-06-23 MED ORDER — OXYCODONE HCL 5 MG/5ML PO SOLN: 5.0000 mg | Freq: Once | ORAL | Status: DC | PRN

## 2016-06-23 MED ORDER — MIDAZOLAM HCL 2 MG/2ML IJ SOLN
INTRAMUSCULAR | Status: AC
  Filled 2016-06-23: qty 2

## 2016-06-23 MED ORDER — SUGAMMADEX SODIUM 200 MG/2ML IV SOLN
INTRAVENOUS | Status: DC | PRN
  Administered 2016-06-23: 200 mg via INTRAVENOUS

## 2016-06-23 MED ORDER — OXYCODONE HCL 5 MG/5ML PO SOLN
5.0000 mg | ORAL | Status: DC | PRN
  Administered 2016-06-24 (×2): 5 mg via ORAL
  Administered 2016-06-25: 10 mg via ORAL
  Administered 2016-06-25 (×2): 5 mg via ORAL
  Administered 2016-06-26 (×2): 10 mg via ORAL
  Filled 2016-06-23: qty 5
  Filled 2016-06-23 (×2): qty 10
  Filled 2016-06-23 (×3): qty 5
  Filled 2016-06-23: qty 10

## 2016-06-23 MED ORDER — HYDROMORPHONE HCL 1 MG/ML IJ SOLN
INTRAMUSCULAR | Status: AC
  Filled 2016-06-23: qty 1

## 2016-06-23 MED ORDER — MIDAZOLAM HCL 5 MG/5ML IJ SOLN
INTRAMUSCULAR | Status: DC | PRN
  Administered 2016-06-23: 2 mg via INTRAVENOUS

## 2016-06-23 MED ORDER — OXYCODONE HCL 5 MG PO TABS: 5.0000 mg | ORAL_TABLET | Freq: Once | ORAL | Status: DC | PRN

## 2016-06-23 MED ORDER — LACTATED RINGERS IV SOLN
INTRAVENOUS | Status: DC
  Administered 2016-06-23 (×2): via INTRAVENOUS

## 2016-06-23 SURGICAL SUPPLY — 70 items
ADH SKN CLS APL DERMABOND .7 (GAUZE/BANDAGES/DRESSINGS) ×2
APL SRG 32X5 SNPLK LF DISP (MISCELLANEOUS)
APPLICATOR COTTON TIP 6IN STRL (MISCELLANEOUS) IMPLANT
APPLIER CLIP ROT 10 11.4 M/L (STAPLE)
APR CLP MED LRG 11.4X10 (STAPLE)
BLADE SURG SZ11 CARB STEEL (BLADE) ×4 IMPLANT
CABLE HIGH FREQUENCY MONO STRZ (ELECTRODE) ×2 IMPLANT
CHLORAPREP W/TINT 26ML (MISCELLANEOUS) ×6 IMPLANT
CLIP APPLIE ROT 10 11.4 M/L (STAPLE) IMPLANT
COVER SURGICAL LIGHT HANDLE (MISCELLANEOUS) ×2 IMPLANT
DERMABOND ADVANCED (GAUZE/BANDAGES/DRESSINGS) ×2
DERMABOND ADVANCED .7 DNX12 (GAUZE/BANDAGES/DRESSINGS) ×2 IMPLANT
DEVICE SUT QUICK LOAD TK 5 (STAPLE) ×1 IMPLANT
DEVICE SUT TI-KNOT TK 5X26 (MISCELLANEOUS) ×1 IMPLANT
DEVICE SUTURE ENDOST 10MM (ENDOMECHANICALS) ×2 IMPLANT
DEVICE TI KNOT TK5 (MISCELLANEOUS) ×1
DEVICE TROCAR PUNCTURE CLOSURE (ENDOMECHANICALS) IMPLANT
DRAPE UTILITY XL STRL (DRAPES) ×8 IMPLANT
ELECT L-HOOK LAP 45CM DISP (ELECTROSURGICAL)
ELECT PENCIL ROCKER SW 15FT (MISCELLANEOUS) IMPLANT
ELECT REM PT RETURN 9FT ADLT (ELECTROSURGICAL) ×4
ELECTRODE L-HOOK LAP 45CM DISP (ELECTROSURGICAL) IMPLANT
ELECTRODE REM PT RTRN 9FT ADLT (ELECTROSURGICAL) ×2 IMPLANT
GAUZE SPONGE 4X4 12PLY STRL (GAUZE/BANDAGES/DRESSINGS) IMPLANT
GLOVE BIO SURGEON STRL SZ7.5 (GLOVE) ×4 IMPLANT
GLOVE INDICATOR 8.0 STRL GRN (GLOVE) ×4 IMPLANT
GOWN STRL REUS W/TWL XL LVL3 (GOWN DISPOSABLE) ×14 IMPLANT
HOVERMATT SINGLE USE (MISCELLANEOUS) ×4 IMPLANT
IRRIG SUCT STRYKERFLOW 2 WTIP (MISCELLANEOUS) ×4
IRRIGATION SUCT STRKRFLW 2 WTP (MISCELLANEOUS) ×2 IMPLANT
KIT BASIN OR (CUSTOM PROCEDURE TRAY) ×4 IMPLANT
MARKER SKIN DUAL TIP RULER LAB (MISCELLANEOUS) ×4 IMPLANT
NDL SPNL 22GX3.5 QUINCKE BK (NEEDLE) ×2 IMPLANT
NEEDLE SPNL 22GX3.5 QUINCKE BK (NEEDLE) ×4 IMPLANT
PACK UNIVERSAL I (CUSTOM PROCEDURE TRAY) ×4 IMPLANT
QUICK LOAD TK 5 (STAPLE) ×1
RELOAD STAPLE 60 3.6 BLU REG (STAPLE) IMPLANT
RELOAD STAPLE 60 3.8 GOLD REG (STAPLE) IMPLANT
RELOAD STAPLE 60 4.1 GRN THCK (STAPLE) IMPLANT
RELOAD STAPLER BLUE 60MM (STAPLE) ×10 IMPLANT
RELOAD STAPLER GOLD 60MM (STAPLE) ×2 IMPLANT
RELOAD STAPLER GREEN 60MM (STAPLE) ×4 IMPLANT
SCISSORS LAP 5X45 EPIX DISP (ENDOMECHANICALS) ×4 IMPLANT
SEALANT SURGICAL APPL DUAL CAN (MISCELLANEOUS) IMPLANT
SHEARS HARMONIC ACE PLUS 45CM (MISCELLANEOUS) ×4 IMPLANT
SLEEVE ADV FIXATION 5X100MM (TROCAR) ×8 IMPLANT
SLEEVE GASTRECTOMY 40FR VISIGI (MISCELLANEOUS) ×4 IMPLANT
SOLUTION ANTI FOG 6CC (MISCELLANEOUS) ×4 IMPLANT
SPONGE LAP 18X18 X RAY DECT (DISPOSABLE) ×4 IMPLANT
STAPLER ECHELON BIOABSB 60 FLE (MISCELLANEOUS) ×16 IMPLANT
STAPLER ECHELON LONG 60 440 (INSTRUMENTS) IMPLANT
STAPLER RELOAD BLUE 60MM (STAPLE) ×20
STAPLER RELOAD GOLD 60MM (STAPLE) ×4
STAPLER RELOAD GREEN 60MM (STAPLE) ×8
SUT MNCRL AB 4-0 PS2 18 (SUTURE) ×4 IMPLANT
SUT SURGIDAC NAB ES-9 0 48 120 (SUTURE) ×2 IMPLANT
SUT VICRYL 0 TIES 12 18 (SUTURE) ×4 IMPLANT
SYR 10ML ECCENTRIC (SYRINGE) ×4 IMPLANT
SYR 20CC LL (SYRINGE) ×4 IMPLANT
SYR 50ML LL SCALE MARK (SYRINGE) ×4 IMPLANT
TOWEL OR 17X26 10 PK STRL BLUE (TOWEL DISPOSABLE) ×4 IMPLANT
TOWEL OR NON WOVEN STRL DISP B (DISPOSABLE) ×4 IMPLANT
TRAY FOLEY W/METER SILVER 16FR (SET/KITS/TRAYS/PACK) IMPLANT
TROCAR ADV FIXATION 5X100MM (TROCAR) ×4 IMPLANT
TROCAR BLADELESS 15MM (ENDOMECHANICALS) ×4 IMPLANT
TROCAR BLADELESS OPT 5 100 (ENDOMECHANICALS) ×4 IMPLANT
TUBING CONNECTING 10 (TUBING) ×3 IMPLANT
TUBING CONNECTING 10' (TUBING) ×1
TUBING ENDO SMARTCAP (MISCELLANEOUS) ×4 IMPLANT
TUBING INSUF HEATED (TUBING) ×4 IMPLANT

## 2016-06-23 NOTE — Anesthesia Postprocedure Evaluation (Signed)
Anesthesia Post Note  Patient: [name]  Procedure(s) Performed: Procedure(s) (LRB): LAPAROSCOPIC GASTRIC SLEEVE RESECTION WITH UPPER ENDO (N/A) LAPAROSCOPIC REPAIR OF HIATAL HERNIA  Patient location during evaluation: PACU Anesthesia Type: General Level of consciousness: awake and alert and patient cooperative Pain management: pain level controlled Vital Signs Assessment: post-procedure vital signs reviewed and stable Respiratory status: spontaneous breathing and respiratory function stable Cardiovascular status: stable Anesthetic complications: no    Last Vitals:  Vitals:   06/23/16 1230 06/23/16 1245  BP: (!) 152/99   Pulse: 95 95  Resp: 19 19  Temp:      Last Pain:  Vitals:   06/23/16 1230  PainSc: [place]

## 2016-06-23 NOTE — Interval H&P Note (Signed)
History and Physical Interval Note:  06/23/2016 9:28 AM  [name]  has presented today for surgery, with the diagnosis of MORBID OBESITY  The various methods of treatment have been discussed with the patient and family. After consideration of risks, benefits and other options for treatment, the patient has consented to  Procedure(s): LAPAROSCOPIC GASTRIC SLEEVE RESECTION WITH UPPER ENDO (N/A) as a surgical intervention .  The patient's history has been reviewed, patient examined, no change in status, stable for surgery.  I have reviewed the patient's chart and labs.  Questions were answered to the patient's satisfaction.    With possible hiatal hernia repair  Leighton Ruff. Redmond Pulling, MD, [place], Bariatric, & Minimally Invasive Surgery [facility], [place]  [facility]

## 2016-06-23 NOTE — Op Note (Signed)
Preoperative diagnosis: laparoscopic sleeve gastrectomy  Postoperative diagnosis: Same   Procedure: Upper endoscopy   Surgeon: Clovis Riley, M.D.  Anesthesia: Gen.   Indications for procedure: This patient was undergoing a laparoscopic sleeve gastrectomy.   Description of procedure: The endoscopy was placed in the mouth and into the oropharynx and under endoscopic vision it was advanced to the esophagogastric junction. The pouch was insufflated and no bleeding or bubbles were seen. No bleeding or leaks were detected. The scope was withdrawn without difficulty.   Clovis Riley, M.D. General, Bariatric, & Minimally Invasive Surgery [facility]

## 2016-06-23 NOTE — H&P (View-Only) (Signed)
[name] 06/13/2016 3:07 PM Location: [facility] Patient #: [phone] DOB: [birth year] Married / Language: English / Race: Black or African American Female  History of Present Illness [name] M. Zyion Leidner MD; 06/13/2016 4:02 PM) The patient is a 40 year old female who presents for a pre-op visit. She comes in today for her preoperative visit. She has been scheduled and approved for laparoscopic sleeve gastrectomy. I initially met her June 14th. She denies any medical changes since she was initially met. She still has problems with her pseudotumor cerebri. She still takes dexilant for reflux. She denies any trips to the emergency room hospital. She denies any chest pain or chest pressure. She denies any shortness of breath. She denies any melena or hematochezia. She denies any abdominal pain. She denies any dysuria. She denies any TIAs or amaurosis fugax. Her upper GI that showed no hiatal hernia. However a EGD did show a small hiatal hernia in the past. Her initial evaluation labs were unremarkable. She did have a hemoglobin A1c of 5.9. There is no anemia or vitamin deficiency.  Review of systems-comprehensive 12 point review of systems was performed all systems are negative except for what is mentioned in the HPI   Problem List/Past Medical [name], MD; 06/13/2016 4:03 PM) BODY MASS INDEX (BMI) OF 35.0 TO 35.9 WITH COMORBIDITY (Z68.35) DYSLIPIDEMIA (E78.5) HYPERCHOLESTEREMIA (E78.00) HYPERTENSION, ESSENTIAL (I10) PREDIABETES (R73.03) FATTY LIVER (K76.0) PSEUDOTUMOR CEREBRI (G93.2)  Other Problems [name], MD; 06/13/2016 4:03 PM) GASTROESOPHAGEAL REFLUX DISEASE WITHOUT ESOPHAGITIS (K21.9) Other disease, cancer, significant illness Seizure Disorder Bladder Problems Back Pain  Past Surgical History [name], MD; 06/13/2016 4:03 PM) Gallbladder Surgery - Laparoscopic Hysterectomy (not due to cancer) - Partial  Diagnostic Studies  History [name], MD; 06/13/2016 4:03 PM) Colonoscopy within last year  Allergies ([place], [place]; 06/13/2016 3:08 PM) No Known Drug Allergies 03/19/2016  Medication History [name], MD; 06/13/2016 4:03 PM) AmLODIPine Besylate (5MG Tablet, Oral) Active. Anucort-HC (25MG Suppository, Rectal) Active. Contrave (8-90MG Tablet ER 12HR, Oral) Active. Dexilant (60MG Capsule DR, Oral) Active. Montelukast Sodium (10MG Tablet, Oral) Active. Topiramate (25MG Tablet, Oral) Active. EPINEPHrine (0.3MG/0.3ML Soln Auto-inj, Injection as needed) Active. ALPRAZolam (1MG Tablet, Oral) Active. Atorvastatin Calcium (40MG Tablet, Oral) Active. Azelastine HCl (0.05% Solution, Ophthalmic) Active. Levocetirizine Dihydrochloride (5MG Tablet, Oral) Active. MetroNIDAZOLE (0.75% Gel, Vaginal as needed) Active. Nitrofurantoin Monohyd Macro (100MG Capsule, Oral) Active. Proctosol HC (2.5% Cream, Rectal as needed) Active. Medications Reconciled OxyCODONE HCl (5MG/5ML Solution, 5-10 Milliliter Oral every four hours, as needed, Taken starting 06/13/2016) Active. Zofran ODT (4MG Tablet Disint, 1 (one) Tablet Disperse Oral every six hours, as needed, Taken starting 06/13/2016) Active.  Social History [name], MD; 06/13/2016 4:03 PM) Caffeine use Carbonated beverages, Tea. No drug use Tobacco use Never smoker. Alcohol use Occasional alcohol use.  Family History [name], MD; 06/13/2016 4:03 PM) Diabetes Mellitus Family Members In General, Mother. Heart Disease Family Members In General. Hypertension Family Members In General, Mother. Cancer Family Members In General. Migraine Headache Daughter, Mother. Thyroid problems Sister.  Pregnancy / Birth History [name], MD; 06/13/2016 4:03 PM) [name] 3 Maternal age 92-20 Para 5 Age at menarche 68 years.     Review of Systems [name] M. Andria Head MD; 06/13/2016 3:59 PM) General Present- Fatigue and Weight Gain.  Not Present- Appetite Loss, Chills, Fever, Night Sweats and Weight Loss. HEENT Present- Earache, Ringing in the Ears, Seasonal Allergies and Visual Disturbances. Not Present- Hearing Loss,  Hoarseness, Nose Bleed, Oral Ulcers, Sinus Pain, Sore Throat, Wears glasses/contact lenses and Yellow Eyes. Respiratory Present- Snoring. Not Present- Bloody sputum, Chronic Cough, Difficulty Breathing and Wheezing. Breast Not Present- Breast Mass, Breast Pain, Nipple Discharge and Skin Changes. Cardiovascular Not Present- Chest Pain, Difficulty Breathing Lying Down, Leg Cramps, Palpitations, Rapid Heart Rate, Shortness of Breath and Swelling of Extremities. Gastrointestinal Present- Hemorrhoids. Not Present- Abdominal Pain, Bloating, Bloody Stool, Change in Bowel Habits, Chronic diarrhea, Constipation, Difficulty Swallowing, Excessive gas, Gets full quickly at meals, Indigestion, Nausea, Rectal Pain and Vomiting. Female Genitourinary Not Present- Frequency, Nocturia, Painful Urination, Pelvic Pain and Urgency. Musculoskeletal Present- Back Pain. Not Present- Joint Pain, Joint Stiffness, Muscle Pain, Muscle Weakness and Swelling of Extremities. Neurological Present- Decreased Memory, Headaches and Weakness. Not Present- Fainting, Numbness, Seizures, Tingling, Tremor and Trouble walking. Psychiatric Present- Anxiety and Depression. Not Present- Bipolar, Change in Sleep Pattern, Fearful and Frequent crying. Endocrine Not Present- Cold Intolerance, Excessive Hunger, Hair Changes, Heat Intolerance, Hot flashes and New Diabetes. Hematology Not Present- Easy Bruising, Excessive bleeding, Gland problems, HIV and Persistent Infections.  Vitals ([name] CMA; 06/13/2016 3:07 PM) 06/13/2016 3:07 PM Weight: 266 lb Height: 69in Body Surface Area: 2.33 m Body Mass Index: 39.28 kg/m  Temp.: 78F(Temporal)  Pulse: 86 (Regular)  BP: 144/82 (Sitting, Left Arm, Standard)      Physical Exam [name] M. Mardy Lucier MD;  06/13/2016 3:59 PM)  General Mental Status-Alert. General Appearance-Consistent with stated age. Hydration-Well hydrated. Voice-Normal. Note: obese  Head and Neck Head-normocephalic, atraumatic with no lesions or palpable masses. Trachea-midline. Thyroid Gland Characteristics - normal size and consistency.  Eye Eyeball - Bilateral-Extraocular movements intact. Sclera/Conjunctiva - Bilateral-No scleral icterus.  Chest and Lung Exam Chest and lung exam reveals -quiet, even and easy respiratory effort with no use of accessory muscles and on auscultation, normal breath sounds, no adventitious sounds and normal vocal resonance. Inspection Chest Wall - Normal. Back - normal.  Breast - Did not examine.  Cardiovascular Cardiovascular examination reveals -normal heart sounds, regular rate and rhythm with no murmurs and normal pedal pulses bilaterally.  Abdomen Inspection Inspection of the abdomen reveals - No Hernias. Skin - Scar - Note: well healed trocar scars. Palpation/Percussion Palpation and Percussion of the abdomen reveal - Soft, Non Tender, No Rebound tenderness, No Rigidity (guarding) and No hepatosplenomegaly. Auscultation Auscultation of the abdomen reveals - Bowel sounds normal.  Peripheral Vascular Upper Extremity Palpation - Pulses bilaterally normal.  Neurologic Neurologic evaluation reveals -alert and oriented x 3 with no impairment of recent or remote memory. Mental Status-Normal.  Neuropsychiatric The patient's mood and affect are described as -normal. Judgment and Insight-insight is appropriate concerning matters relevant to self.  Musculoskeletal Normal Exam - Left-Upper Extremity Strength Normal and Lower Extremity Strength Normal. Normal Exam - Right-Upper Extremity Strength Normal and Lower Extremity Strength Normal.  Lymphatic Head & Neck  General Head & Neck Lymphatics: Bilateral - Description - Normal. Axillary  - Did not examine. Femoral & Inguinal - Did not examine.    Assessment & Plan [name] M. Ena Demary MD; 06/13/2016 4:03 PM)  BODY MASS INDEX (BMI) OF 35.0 TO 35.9 WITH COMORBIDITY (Z68.35) Impression: We discussed her preoperative workup. We reviewed her evaluation labs which were not terribly abnormal. She did have an hemoglobin A1c of 5.9 which is consistent with prediabetes. We discussed the typical recovery period. Her upper GI showed no hiatal hernia but upper endoscopy within the past year or 2 showed a small hiatal hernia. Since she has reflux we  will test her for a hiatal hernia intraoperatively and if it is present though ahead and repaired at the time. We discussed with that would involve. We discussed the preoperative diet plan. We discussed the typical postoperative recovery course as well as the initial postoperative meal plan. All of her questions were asked and answered. She was instructed to call the office should she have any questions between now and surgery. Because of her work I believe she will need to be out of work for at least 6 weeks. She only gets to 15 minute breaks and one 30 minute break for a 10-12 hour shift. Given her new anatomy after surgery and inability to eat rapidly as well as the abdominal wall incision for the sleeve gastrectomy extraction site doubly keeping her out of work for 6 weeks will be the best thing for her in order to minimize postoperative complications as well as potential need for readmission for dehydration. When she returns to work at 6 weeks to think she will need to have access to water and protein shakes at her work station.  Current Plans Pt Education - EMW_preopbariatric DYSLIPIDEMIA (E78.5)  HYPERTENSION, ESSENTIAL (I10)  PSEUDOTUMOR CEREBRI (G93.2)  FATTY LIVER (K76.0)  PREDIABETES (R73.03)  Leighton Ruff. Redmond Pulling, MD, FACS General, Bariatric, & Minimally Invasive Surgery [facility], [place]

## 2016-06-23 NOTE — Op Note (Signed)
06/23/2016 [name] Mar 08, 1976 [id number]   PRE-OPERATIVE DIAGNOSIS:     Obesity (BMI 38)   Pseudotumor cerebri   HTN (hypertension)   GERD (gastroesophageal reflux disease)   IIH (idiopathic intracranial hypertension)   Prediabetes   Hiatal hernia   Hypercholesterolemia with hypertriglyceridemia  POST-OPERATIVE DIAGNOSIS:  same  PROCEDURE:  Procedure(s): LAPAROSCOPIC SLEEVE GASTRECTOMY WITH HIATAL HERNIA REPAIR UPPER GI ENDOSCOPY  SURGEON:  Surgeon(s): [name], MD FACS FASMBS  ASSISTANTS: Romana Juniper MD  ANESTHESIA:   general  DRAINS: none   BOUGIE: 40 fr ViSiGi  LOCAL MEDICATIONS USED:  MARCAINE + Exparel  SPECIMEN:  Source of Specimen:  Greater curvature of stomach  DISPOSITION OF SPECIMEN:  PATHOLOGY  COUNTS:  YES  INDICATION FOR PROCEDURE: This is a very pleasant 40 y.o.-year-old morbidly obese female who has had unsuccessful attempts for sustained weight loss. The patient presents today for a planned laparoscopic sleeve gastrectomy with upper endoscopy. We have discussed the risk and benefits of the procedure extensively preoperatively. Please see my separate notes.  PROCEDURE: After obtaining informed consent and receiving 5000 units of subcutaneous heparin, the patient was brought to the operating room at [facility] and placed supine on the operating room table. General endotracheal anesthesia was established. Sequential compression devices were placed. A orogastric tube was placed. The patient's abdomen was prepped and draped in the usual standard surgical fashion. The patient received preoperative IV antibiotic. A surgical timeout was performed.  Access to the abdomen was achieved using a 5 mm 0 laparoscope thru a 5 mm trocar In the left upper Quadrant 2 fingerbreadths below the left subcostal margin using the Optiview technique. Pneumoperitoneum was smoothly established up to 15 mm of mercury. The laparoscope was advanced and the abdominal  cavity was surveilled. The patient was then placed in reverse Trendelenburg.   A 5 mm trocar was placed slightly above and to the left of the umbilicus under direct visualization.  The [facility] liver retractor was placed under the left lobe of the liver through a 5 mm trocar incision site in the subxiphoid position. A 5 mm trocar was placed in the lateral right upper quadrant along with a 15 mm trocar in the mid right abdomen. A final 5 mm trocar was placed in the lateral LUQ.  All under direct visualization after local had been infiltrated.  The stomach was inspected. It was completely decompressed and the orogastric tube was removed. Her UGI showed no hiatal hernia however a remote upper endoscopy demonstrated a small sliding hiatal hernia.   There was no anterior dimple that was obviously visible. The calibration tube was placed in the oropharynx and guided down into the stomach by the CRNA. 10 mL of air was insufflated into the calibration balloon. The calibration tubing was then gently pulled back by the CRNA and it slid past the GE junction. At this point the calibration tubing was desufflated and pulled back into the esophagus. This confirmed my suspicion of a clinically significant hiatal hernia. The gastrohepatic ligament was incised with harmonic scalpel. The right crus was identified. We identified the crossing fat along the right crus. The adipose tissue just above this area was incised with harmonic scalpel. I then bluntly dissected out this area and identified the left crus. There was evidence of a hiatal hernia. I then mobilized the esophagus. The left and right crus were further mobilized with blunt dissection. I was then able to reapproximate the left and right crus with 0 Ethibond using an  Endostitch suture device and securing it with a titanium tyknot. We then had the CRNA readvanced the calibration tubing back into the stomach. 10 mL of air was insufflated into the calibration tube balloon.  The calibration tube was then gently pulled back and there was resistance at the GE junction. The tube did not slide back up into the esophagus. At this point the calibration tubing was deflated and removed from the patient's body.   We identified the pylorus and measured 6 cm proximal to the pylorus and identified an area of where we would start taking down the short gastric vessels. Harmonic scalpel was used to take down the short gastric vessels along the greater curvature of the stomach. We were able to enter the lesser sac. We continued to march along the greater curvature of the stomach taking down the short gastrics. As we approached the gastrosplenic ligament we took care in this area not to injure the spleen. We were able to take down the entire gastrosplenic ligament. We then mobilized the fundus away from the left crus of diaphragm. There were a few posterior gastric avascular attachments which were taken down. This left the stomach completely mobilized. No vessels had been taken down along the lesser curvature of the stomach.  We then reidentified the pylorus. A 40Fr ViSiGi was then placed in the oropharynx and advanced down into the stomach and placed in the distal antrum and positioned along the lesser curvature. It was placed under suction which secured the 40Fr ViSiGi in place along the lesser curve. Then using the Ethicon echelon 60 mm stapler with a green load with Seamguard, I placed a stapler along the antrum approximately 5 cm from the pylorus. The stapler was angled so that there is ample room at the angularis incisura. I then fired the first staple load after inspecting it posteriorly to ensure adequate space both anteriorly and posteriorly. At this point I still was not completely past the angularis so with another green load with Seamguard, I placed the stapler in position just inside the prior stapleline. We then rotated the stomach to insure that there was adequate anteriorly as well  as posteriorly. The stapler was then fired. I used another 76mm green cartridge with seamguard. At this point I started using 60 mm gold load staple cartridge with Seamguard x 1. The echelon stapler was then repositioned with a 60 mm gold load with Seamguard and we continued to march up along the [place]. My assistant was holding traction along the greater curvature stomach along the cauterized short gastric vessels ensuring that the stomach was symmetrically retracted. Prior to each firing of the staple, we rotated the stomach to ensure that there is adequate stomach left.  As we approached the fundus, I used 60 mm blue cartridge with Seamguard aiming slightly lateral to the esophageal fat pad. Although the staples on this fire had completely gone thru the last part of the stomach it had not completely cut it. Therefore 1 additional 60 blue load was used to free the remaining stomach. The sleeve was inspected. There is no evidence of cork screw. The staple line appeared hemostatic. The CRNA inflated the ViSiGi to the green zone and the upper abdomen was flooded with saline. There were no bubbles. The sleeve was decompressed and the ViSiGi removed. My assistant scrubbed out and performed an upper endoscopy. The sleeve easily distended with air and the scope was easily advanced to the pylorus. There is no evidence of internal bleeding or cork  screwing. There was no narrowing at the angularis. There is no evidence of bubbles. Please see his operative note for further details. The gastric sleeve was decompressed and the endoscope was removed.  The greater curvature the stomach was grasped with a laparoscopic grasper and removed from the 15 mm trocar site.  The liver retractor was removed. I then closed the 15 mm trocar site with 1 interrupted 0 Vicryl sutures through the fascia using the endoclose. The closure was viewed laparoscopically and it was airtight. 70 cc of Exparel was then infiltrated in the preperitoneal  spaces around the trocar sites. Pneumoperitoneum was released. All trocar sites were closed with a 4-0 Monocryl in a subcuticular fashion followed by the application of Dermabond. The patient was extubated and taken to the recovery room in stable condition. All needle, instrument, and sponge counts were correct x2. There are no immediate complications  (2) 60 mm green with Seamguard (1) 60 mm gold with seamguard (4) 60 mm blue with 3 seamguard  PLAN OF CARE: Admit to inpatient   PATIENT DISPOSITION:  PACU - hemodynamically stable.   Delay start of Pharmacological VTE agent (>24hrs) due to surgical blood loss or risk of bleeding:  no  Leighton Ruff. Redmond Pulling, MD, FACS FASMBS General, Bariatric, & Minimally Invasive Surgery [facility], [place]

## 2016-06-23 NOTE — Transfer of Care (Signed)
Immediate Anesthesia Transfer of Care Note  Patient: [name]  Procedure(s) Performed: Procedure(s): LAPAROSCOPIC GASTRIC SLEEVE RESECTION WITH UPPER ENDO (N/A) LAPAROSCOPIC REPAIR OF HIATAL HERNIA  Patient Location: PACU  Anesthesia Type:General  Level of Consciousness:  sedated, patient cooperative and responds to stimulation  Airway & Oxygen Therapy:Patient Spontanous Breathing and Patient connected to face mask oxgen  Post-op Assessment:  Report given to PACU RN and Post -op Vital signs reviewed and stable  Post vital signs:  Reviewed and stable  Last Vitals: There were no vitals filed for this visit.  Complications: No apparent anesthesia complications

## 2016-06-23 NOTE — Discharge Instructions (Addendum)
[facility] Discharge Instructions  °On the day of surgery you were given the medication aprepitant. This medication interacts with hormonal forms of birth control (oral contraceptives and injected or implanted birth control) and may make them ineffective. °IF YOU USE ANY HORMONAL FORM OF BIRTH CONTROL, YOU MUST USE AN ADDITIONAL BARRIER BIRTH CONTROL METHOD FOR ONE MONTH after receiving aprepitant or there is a chance you could become pregnant. ° ° ° ° °GASTRIC BYPASS/SLEEVE ° Home Care Instructions ° ° These instructions are to help you care for yourself when you go home. ° °Call: If you have any problems. °• Call [phone] and ask for the surgeon on call °• If you need immediate assistance come to the ER at [facility]. Tell the ER staff you are a new post-op gastric bypass or gastric sleeve patient  °Signs and symptoms to report: • Severe  vomiting or nausea °o If you cannot handle clear liquids for longer than 1 day, call your surgeon °• Abdominal pain which does not get better after taking your pain medication °• Fever greater than 100.4°  F and chills °• Heart rate over 100 beats a minute °• Trouble breathing °• Chest pain °• Redness,  swelling, drainage, or foul odor at incision (surgical) sites °• If your incisions open or pull apart °• Swelling or pain in calf (lower leg) °• Diarrhea (Loose bowel movements that happen often), frequent watery, uncontrolled bowel movements °• Constipation, (no bowel movements for 3 days) if this happens: °o Take Milk of Magnesia, 2 tablespoons by mouth, 3 times a day for 2 days if needed °o Stop taking Milk of Magnesia once you have had a bowel movement °o Call your doctor if constipation continues °Or °o Take Miralax  (instead of Milk of Magnesia) following the label instructions °o Stop taking Miralax once you have had a bowel movement °o Call your doctor if constipation continues °• Anything you think is “abnormal for you” °  °Normal side effects after surgery: • Unable  to sleep at night or unable to concentrate °• Irritability °• Being tearful (crying) or depressed ° °These are common complaints, possibly related to your anesthesia, stress of surgery, and change in lifestyle, that usually go away a few weeks after surgery. If these feelings continue, call your medical doctor.  °Wound Care: You may have surgical glue, steri-strips, or staples over your incisions after surgery °• Surgical glue: Looks like clear film over your incisions and will wear off a little at a time °• Steri-strips: Adhesive strips of tape over your incisions. You may notice a yellowish color on skin under the steri-strips. This is used to make the steri-strips stick better. Do not pull the steri-strips off - let them fall off °• Staples: Staples may be removed before you leave the hospital °o If you go home with staples, call [facility] for an appointment with your surgeon’s nurse to have staples removed 10 days after surgery, [phone] °• Showering: You may shower two (2) days after your surgery unless your surgeon tells you differently °o Wash gently around incisions with warm soapy water, rinse well, and gently pat dry °o If you have a drain (tube from your incision), you may need someone to hold this while you shower °o No tub baths until staples are removed and incisions are healed °  °Medications: • Medications should be liquid or crushed if larger than the size of a dime °• Extended release pills (medication that releases a little bit at   a time through the  day) should not be crushed °• Depending on the size and number of medications you take, you may need to space (take a few throughout the day)/change the time you take your medications so that you do not over-fill your pouch (smaller stomach) °• Make sure you follow-up with you primary care physician to make medication changes needed during rapid weight loss and life -style changes °• If you have diabetes, follow up with your  doctor that orders your diabetes medication(s) within one week after surgery and check your blood sugar regularly ° °• Do not drive while taking narcotics (pain medications) ° °• Do not take acetaminophen (Tylenol) and Roxicet or Lortab Elixir at the same time since these pain medications contain acetaminophen °  °Diet:  °First 2 Weeks You will see the nutritionist about two (2) weeks after your surgery. The nutritionist will increase the types of foods you can eat if you are handling liquids well: °• If you have severe vomiting or nausea and cannot handle clear liquids lasting longer than 1 day call your surgeon °Protein Shake °• Drink at least 2 ounces of shake 5-6 times per day °• Each serving of protein shakes (usually 8-12 ounces) should have a minimum of: °o 15 grams of protein °o And no more than 5 grams of carbohydrate °• Goal for protein each day: °o Men = 80 grams per day °o Women = 60 grams per day °  ° • Protein powder may be added to fluids such as non-fat milk or Lactaid milk or Soy milk (limit to 35 grams added protein powder per serving) ° °Hydration °• Slowly increase the amount of water and other clear liquids as tolerated (See Acceptable Fluids) °• Slowly increase the amount of protein shake as tolerated °• Sip fluids slowly and throughout the day °• May use sugar substitutes in small amounts (no more than 6-8 packets per day; i.e. Splenda) ° °Fluid Goal °• The first goal is to drink at least 8 ounces of protein shake/drink per day (or as directed by the nutritionist); some examples of protein shakes are Syntrax Nectar, Adkins Advantage, EAS Edge HP, and Unjury. - See handout from pre-op Bariatric Education Class: °o Slowly increase the amount of protein shake you drink as tolerated °o You may find it easier to slowly sip shakes throughout the day °o It is important to get your proteins in first °• Your fluid goal is to drink 64-100 ounces of fluid daily °o It may take a few weeks to build up to  this  °• 32 oz. (or more) should be clear liquids °And °• 32 oz. (or more) should be full liquids (see below for examples) °• Liquids should not contain sugar, caffeine, or carbonation ° °Clear Liquids: °• Water of Sugar-free flavored water (i.e. Fruit H²O, Propel) °• Decaffeinated coffee or tea (sugar-free) °• Crystal lite, Wyler’s Lite, Minute Maid Lite °• Sugar-free Jell-O °• Bouillon or broth °• Sugar-free Popsicle:    - Less than 20 calories each; Limit 1 per day ° °Full Liquids: °                  Protein Shakes/Drinks + 2 choices per day of other full liquids °• Full liquids must be: °o No More Than 12 grams of Carbs per serving °o No More Than 3 grams of Fat per serving °• Strained low-fat cream soup °• Non-Fat milk °• Fat-free Lactaid Milk °• Sugar-free yogurt (Dannon Lite & Fit, Greek   yogurt) ° °  °Vitamins and Minerals • Start 1 day after surgery unless otherwise directed by your surgeon °• 2 Chewable Multivitamin / Multimineral Supplement with iron (i.e. Centrum for Adults) °• Vitamin B-12, 350-500 micrograms sub-lingual (place tablet under the tongue) each day °• Chewable Calcium Citrate with Vitamin D-3 °(Example: 3 Chewable Calcium  Plus 600 with Vitamin D-3) °o Take 500 mg three (3) times a day for a total of 1500 mg each day °o Do not take all 3 doses of calcium at one time as it may cause constipation, and you can only absorb 500 mg at a time °o Do not mix multivitamins containing iron with calcium supplements;  take 2 hours apart °o Do not substitute Tums (calcium carbonate) for your calcium °• Menstruating women and those at risk for anemia ( a blood disease that causes weakness) may need extra iron °o Talk to your doctor to see if you need more iron °• If you need extra iron: Total daily Iron recommendation (including Vitamins) is 50 to 100 mg Iron/day °• Do not stop taking or change any vitamins or minerals until you talk to your nutritionist or surgeon °• Your nutritionist and/or surgeon must  approve all vitamin and mineral supplements °  °Activity and Exercise: It is important to continue walking at home. Limit your physical activity as instructed by your doctor. During this time, use these guidelines: °• Do not lift anything greater than ten  (10) pounds for at least two (2) weeks °• Do not go back to work or drive until your surgeon says you can °• You may have sex when you feel comfortable °o It is VERY important for female patients to use a reliable birth control method; fertility often increase after surgery °o Do not get pregnant for at least 18 months °• Start exercising as soon as your doctor tells you that you can °o Make sure your doctor approves any physical activity °• Start with a simple walking program °• Walk 5-15 minutes each day, 7 days per week °• Slowly increase until you are walking 30-45 minutes per day °• Consider joining our BELT program. [phone] or email [email or web address] °  °Special Instructions Things to remember: °• Free counseling is available for you and your family through collaboration between [place] and [employer]. Please call [phone] and leave a message °• Use your CPAP when sleeping if this applies to you °• Consider buying a medical alert bracelet that says you had lap-band surgery °  °  You will likely have your first fill (fluid added to your band) 6 - 8 weeks after surgery °• [facility] has a free Bariatric Surgery Support Group that meets monthly, the 3rd Thursday, 6pm. [employer] Class[facility]. You can see classes online at [email or web address] °• It is very important to keep all follow up appointments with your surgeon, nutritionist, primary care physician, and behavioral health practitioner °o After the first year, please follow up with your bariatric surgeon and nutritionist at least once a year in order to maintain best weight loss results °      °             [facility]:  [phone] ° °             [facility]: [phone] ° °             Bariatric Nurse Coordinator: [phone]  °Gastric Bypass/Sleeve Home Care   Instructions  Rev. 11/2012    ° °                                                    Reviewed and Endorsed °                                                   by [facility] Patient Education Committee, Jan, 2014 ° ° ° ° ° ° ° ° ° °

## 2016-06-23 NOTE — Anesthesia Preprocedure Evaluation (Signed)
Anesthesia Evaluation  Patient identified by MRN, date of birth, ID band Patient awake    Reviewed: Allergy & Precautions, NPO status , Patient's Chart, lab work & pertinent test results  Airway Mallampati: II   Neck ROM: full    Dental   Pulmonary asthma ,    breath sounds clear to auscultation       Cardiovascular hypertension,  Rhythm:regular Rate:Normal     Neuro/Psych  Headaches, Seizures -,   Neuromuscular disease    GI/Hepatic hiatal hernia, GERD  ,  Endo/Other  Morbid obesity  Renal/GU      Musculoskeletal   Abdominal   Peds  Hematology   Anesthesia Other Findings   Reproductive/Obstetrics                             Anesthesia Physical Anesthesia Plan  ASA: II  Anesthesia Plan: General   Post-op Pain Management:    Induction: Intravenous  Airway Management Planned: Oral ETT  Additional Equipment:   Intra-op Plan:   Post-operative Plan: Extubation in OR  Informed Consent: I have reviewed the patients History and Physical, chart, labs and discussed the procedure including the risks, benefits and alternatives for the proposed anesthesia with the patient or authorized representative who has indicated his/her understanding and acceptance.     Plan Discussed with: CRNA, Anesthesiologist and Surgeon  Anesthesia Plan Comments:         Anesthesia Quick Evaluation

## 2016-06-23 NOTE — Anesthesia Procedure Notes (Signed)
Procedure Name: Intubation Date/Time: 06/23/2016 9:56 AM Performed by: Montel Clock Pre-anesthesia Checklist: Patient identified, Emergency Drugs available, Suction available, Patient being monitored and Timeout performed Patient Re-evaluated:Patient Re-evaluated prior to inductionOxygen Delivery Method: Circle system utilized Preoxygenation: Pre-oxygenation with 100% oxygen Intubation Type: IV induction Ventilation: Mask ventilation without difficulty and Oral airway inserted - appropriate to patient size Laryngoscope Size: Mac and 3 Grade View: Grade II Tube type: Oral Tube size: 7.5 mm Number of attempts: 1 Airway Equipment and Method: Stylet Placement Confirmation: ETT inserted through vocal cords under direct vision,  positive ETCO2 and breath sounds checked- equal and bilateral Secured at: 22 cm Tube secured with: Tape Dental Injury: Teeth and Oropharynx as per pre-operative assessment

## 2016-06-24 LAB — COMPREHENSIVE METABOLIC PANEL
ALK PHOS: 48 U/L (ref 38–126)
ALT: 72 U/L — AB (ref 14–54)
AST: 56 U/L — AB (ref 15–41)
Albumin: 3.9 g/dL (ref 3.5–5.0)
Anion gap: 7 (ref 5–15)
BUN: 7 mg/dL (ref 6–20)
CHLORIDE: 105 mmol/L (ref 101–111)
CO2: 24 mmol/L (ref 22–32)
CREATININE: 0.72 mg/dL (ref 0.44–1.00)
Calcium: 9 mg/dL (ref 8.9–10.3)
GFR calc Af Amer: >60 mL/min (ref >60)
GFR calc non Af Amer: >60 mL/min (ref >60)
GLUCOSE: 142 mg/dL — AB (ref 65–99)
Potassium: 4.3 mmol/L (ref 3.5–5.1)
SODIUM: 136 mmol/L (ref 135–145)
Total Bilirubin: 0.3 mg/dL (ref 0.3–1.2)
Total Protein: 7.7 g/dL (ref 6.5–8.1)

## 2016-06-24 LAB — CBC WITH DIFFERENTIAL/PLATELET
BASOS ABS: 0 10*3/uL (ref 0.0–0.1)
Basophils Relative: 0 %
EOS ABS: 0 10*3/uL (ref 0.0–0.7)
EOS PCT: 0 %
HCT: 39.1 % (ref 36.0–46.0)
HEMOGLOBIN: 12.9 g/dL (ref 12.0–15.0)
LYMPHS PCT: 9 %
Lymphs Abs: 1.2 10*3/uL (ref 0.7–4.0)
MCH: 28.7 pg (ref 26.0–34.0)
MCHC: 33 g/dL (ref 30.0–36.0)
MCV: 87.1 fL (ref 78.0–100.0)
Monocytes Absolute: 1 10*3/uL (ref 0.1–1.0)
Monocytes Relative: 7 %
NEUTROS PCT: 84 %
Neutro Abs: 11.5 10*3/uL — ABNORMAL HIGH (ref 1.7–7.7)
PLATELETS: 370 10*3/uL (ref 150–400)
RBC: 4.49 MIL/uL (ref 3.87–5.11)
RDW: 13.2 % (ref 11.5–15.5)
WBC: 13.6 10*3/uL — AB (ref 4.0–10.5)

## 2016-06-24 LAB — HEMOGLOBIN AND HEMATOCRIT, BLOOD
HCT: 38.4 % (ref 36.0–46.0)
Hemoglobin: 12.5 g/dL (ref 12.0–15.0)

## 2016-06-24 MED ORDER — SIMETHICONE 40 MG/0.6ML PO SUSP
40.0000 mg | Freq: Four times a day (QID) | ORAL | Status: DC | PRN
Start: 2016-06-24 — End: 2016-06-26
  Administered 2016-06-24 – 2016-06-25 (×3): 40 mg via ORAL
  Filled 2016-06-24 (×5): qty 0.6

## 2016-06-24 MED ORDER — HYDROMORPHONE HCL 1 MG/ML IJ SOLN
0.5000 mg | INTRAMUSCULAR | Status: DC | PRN
  Administered 2016-06-24: 0.5 mg via INTRAVENOUS
  Administered 2016-06-24 – 2016-06-25 (×4): 1 mg via INTRAVENOUS
  Filled 2016-06-24 (×5): qty 1

## 2016-06-24 NOTE — Plan of Care (Signed)
Problem: Food- and Nutrition-Related Knowledge Deficit (NB-1.1) Goal: Nutrition education Formal process to instruct or train a patient/client in a skill or to impart knowledge to help patients/clients voluntarily manage or modify food choices and eating behavior to maintain or improve health. Outcome: Completed/Met Date Met: 06/24/16 Nutrition Education Note  Received consult for diet education per DROP protocol.   Discussed 2 week post op diet with pt. Emphasized that liquids must be non carbonated, non caffeinated, and sugar free. Fluid goals discussed. Reviewed progression of diet to include soft proteins at 7-10 days post-op. Pt to follow up with outpatient bariatric RD for further diet progression after 2 weeks. Multivitamins and minerals also reviewed. Teach back method used, pt expressed understanding, expect good compliance.   Diet: First 2 Weeks  You will see the dietitian about two (2) weeks after your surgery. The dietitian will increase the types of foods you can eat if you are handling liquids well:  If you have severe vomiting or nausea and cannot handle clear liquids lasting longer than 1 day, call your surgeon  Protein Shake  Drink at least 2 ounces of shake 5-6 times per day  Each serving of protein shakes (usually 8 - 12 ounces) should have a minimum of:  15 grams of protein  And no more than 5 grams of carbohydrate  Goal for protein each day:  Men = 80 grams per day  Women = 60 grams per day  Protein powder may be added to fluids such as non-fat milk or Lactaid milk or Soy milk (limit to 35 grams added protein powder per serving)   Hydration  Slowly increase the amount of water and other clear liquids as tolerated (See Acceptable Fluids)  Slowly increase the amount of protein shake as tolerated  Sip fluids slowly and throughout the day  May use sugar substitutes in small amounts (no more than 6 - 8 packets per day; i.e. Splenda)   Fluid Goal  The first goal is to  drink at least 8 ounces of protein shake/drink per day (or as directed by the nutritionist); some examples of protein shakes are [employer], [employer], EAS Edge HP, and Unjury. See handout from pre-op Bariatric Education Class:  Slowly increase the amount of protein shake you drink as tolerated  You may find it easier to slowly sip shakes throughout the day  It is important to get your proteins in first  Your fluid goal is to drink 64 - 100 ounces of fluid daily  It may take a few weeks to build up to this  32 oz (or more) should be clear liquids  And  32 oz (or more) should be full liquids (see below for examples)  Liquids should not contain sugar, caffeine, or carbonation   Clear Liquids:  Water or Sugar-free flavored water (i.e. Fruit H2O, Propel)  Decaffeinated coffee or tea (sugar-free)  Crystal Lite, Wyler's Lite, Minute Maid Lite  Sugar-free Jell-O  Bouillon or broth  Sugar-free Popsicle: *Less than 20 calories each; Limit 1 per day   Full Liquids:  Protein Shakes/Drinks + 2 choices per day of other full liquids  Full liquids must be:  No More Than 12 grams of Carbs per serving  No More Than 3 grams of Fat per serving  Strained low-fat cream soup  Non-Fat milk  Fat-free Lactaid Milk  Sugar-free yogurt (Dannon Lite & Fit, Greek yogurt)     [name] Bibles, MS, RD, LDN Pager: [phone] After Hours Pager: [phone]

## 2016-06-24 NOTE — Progress Notes (Signed)
Patient ID: [name], female   DOB: 1976-03-07, 40 y.o.   MRN: [id number]  Progress Note: Metabolic and Bariatric Surgery Service   Subjective: Several episodes of emesis of water. Feels like it sits in upper abd. Gas pain in lower chest/upper abd. Didn't walk in halls last night. Fentanyl not controlling pain  Objective: Vital signs in last 24 hours: Temp:  [97.5 F (36.4 C)-98.7 F (37.1 C)] 97.5 F (36.4 C) (09/19 0655) Pulse Rate:  [70-95] 70 (09/19 0655) Resp:  [14-19] 15 (09/19 0655) BP: (106-156)/(59-99) 125/74 (09/19 0655) SpO2:  [95 %-100 %] 100 % (09/19 0655) Weight:  [119.9 kg (264 lb 6 oz)] 119.9 kg (264 lb 6 oz) (09/18 0813)    Intake/Output from previous day: 09/18 0701 - 09/19 0700 In: 4281.7 [P.O.:140; I.V.:3691.7; IV Piggyback:450] Out: [id number] [Urine:3875; Blood:20] Intake/Output this shift: No intake/output data recorded.  Lungs: cta  Cardiovascular: reg  Abd: soft, nd, approp mild ttp, incisions c/d/i  Extremities: +scds, no edema  Neuro: oriented, nontoxic  Lab Results: CBC   Recent Labs  06/23/16 1249 06/24/16 0504  WBC  --  13.6*  HGB 12.7 12.9  HCT 37.2 39.1  PLT  --  370   BMET  Recent Labs  06/24/16 0504  NA 136  K 4.3  CL 105  CO2 24  GLUCOSE 142*  BUN 7  CREATININE 0.72  CALCIUM 9.0   PT/INR No results for input(s): LABPROT, INR in the last 72 hours. ABG No results for input(s): PHART, HCO3 in the last 72 hours.  Invalid input(s): PCO2, PO2  Studies/Results:  Anti-infectives: Anti-infectives    Start     Dose/Rate Route Frequency Ordered Stop   06/23/16 0809  cefoTEtan in Dextrose 5% (CEFOTAN) IVPB 2 g     2 g Intravenous On call to O.R. 06/23/16 0809 06/23/16 0958      Medications: Scheduled Meds: . enoxaparin (LOVENOX) injection  30 mg Subcutaneous Q12H  . famotidine (PEPCID) IV  20 mg Intravenous Q12H  . ketotifen  1 drop Both Eyes BID  . mouth rinse  15 mL Mouth Rinse BID  . [START ON 06/25/2016]  protein supplement shake  2 oz Oral Q2H   Continuous Infusions: . dextrose 5 % and 0.45 % NaCl with KCl 20 mEq/L 1,000 mL (06/24/16 0002)   PRN Meds:.oxyCODONE **AND** acetaminophen, acetaminophen (TYLENOL) oral liquid 160 mg/5 mL, diphenhydrAMINE, fentaNYL (SUBLIMAZE) injection, HYDROmorphone (DILAUDID) injection, ondansetron (ZOFRAN) IV, promethazine, simethicone  Assessment/Plan: Patient Active Problem List   Diagnosis Date Noted  . Prediabetes 06/23/2016  . Hiatal hernia 06/23/2016  . Hypercholesterolemia with hypertriglyceridemia 06/23/2016  . 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 IIH (idiopathic intracranial hypertension) 03/03/2016  . Second degree hemorrhoids   . Headache 01/05/2016  . Chest pain 01/01/2016  . Pseudotumor cerebri 01/01/2016  . HTN (hypertension) 01/01/2016  . Obesity (BMI 30-39.9) 01/01/2016  . Asthma 01/01/2016  . GERD (gastroesophageal reflux disease) 01/01/2016  . Chest pain syndrome 01/01/2016  . Rectal bleeding 12/26/2015  . Chest discomfort 12/26/2015  . Frequent headaches 12/26/2015  . Pain in joint, lower leg 04/11/2014  . Stiffness of joint, not elsewhere classified, pelvic region and thigh 04/11/2014  . Difficulty in walking(719.7) 04/11/2014  . 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 Rhabdomyolysis 06/27/2013  . Hypokalemia 06/27/2013  . Bruising 06/27/2013  . ACHILLES TENDON RUPTURE 05/29/2008   s/p Procedure(s): LAPAROSCOPIC GASTRIC SLEEVE RESECTION WITH UPPER ENDO LAPAROSCOPIC REPAIR OF HIATAL HERNIA 06/23/2016  No fever, no tachycardia  but not taking in water well. Keep on water this am.  Change pain meds Cont chemical vte prophylaxis Antiemetics Simethicone  Disposition:  LOS: 1 day  The patient does not meet criteria for discharge because She has persistant nausea and vomiting and is at high risk of dehydration, did not meet oral intake goals and is at high risk of dehydration and has uncontrolled pain and is  requiring intravenous medications   Leighton Ruff. Redmond Pulling, MD, FACS General, Bariatric, & Minimally Invasive Surgery [facility], [place]   [name], MD [phone] [facility]

## 2016-06-25 LAB — CBC WITH DIFFERENTIAL/PLATELET
Basophils Absolute: 0 10*3/uL (ref 0.0–0.1)
Basophils Relative: 0 %
EOS ABS: 0 10*3/uL (ref 0.0–0.7)
EOS PCT: 0 %
HCT: 37.1 % (ref 36.0–46.0)
Hemoglobin: 12.1 g/dL (ref 12.0–15.0)
LYMPHS ABS: 2.7 10*3/uL (ref 0.7–4.0)
Lymphocytes Relative: 24 %
MCH: 28.9 pg (ref 26.0–34.0)
MCHC: 32.6 g/dL (ref 30.0–36.0)
MCV: 88.8 fL (ref 78.0–100.0)
MONO ABS: 1.1 10*3/uL — AB (ref 0.1–1.0)
MONOS PCT: 10 %
Neutro Abs: 7.1 10*3/uL (ref 1.7–7.7)
Neutrophils Relative %: 66 %
PLATELETS: 339 10*3/uL (ref 150–400)
RBC: 4.18 MIL/uL (ref 3.87–5.11)
RDW: 13.5 % (ref 11.5–15.5)
WBC: 11 10*3/uL — AB (ref 4.0–10.5)

## 2016-06-25 NOTE — Progress Notes (Signed)
Patient ID: [name], female   DOB: 1976/04/03, 40 y.o.   MRN: [id number]  Progress Note: Metabolic and Bariatric Surgery Service   Subjective: Still burping. Will drink and it feelslike it will in upper abd with cramps and then come back up some; ambulated 5x times in hall; only got in about 3oz of water yesterday  Objective: Vital signs in last 24 hours: Temp:  [98.6 F (37 C)-99.1 F (37.3 C)] 98.6 F (37 C) (09/20 0732) Pulse Rate:  [73-82] 78 (09/20 0732) Resp:  [16-19] 16 (09/20 0732) BP: (114-156)/(65-95) 149/80 (09/20 0732) SpO2:  [90 %-98 %] 98 % (09/20 0732)    Intake/Output from previous day: 09/19 0701 - 09/20 0700 In: 2881.7 [P.O.:40; I.V.:2791.7; IV Piggyback:50] Out: 2000 [Urine:2000] Intake/Output this shift: No intake/output data recorded.  Lungs: cta  Cardiovascular: reg  Abd: soft, less tender today, incisions c/d/i; +BS  Extremities: no edema, +SCDs  Neuro: a&ox2, nontoxic  Lab Results: CBC   Recent Labs  06/24/16 0504 06/24/16 1636 06/25/16 0446  WBC 13.6*  --  11.0*  HGB 12.9 12.5 12.1  HCT 39.1 38.4 37.1  PLT 370  --  339   BMET  Recent Labs  06/24/16 0504  NA 136  K 4.3  CL 105  CO2 24  GLUCOSE 142*  BUN 7  CREATININE 0.72  CALCIUM 9.0   PT/INR No results for input(s): LABPROT, INR in the last 72 hours. ABG No results for input(s): PHART, HCO3 in the last 72 hours.  Invalid input(s): PCO2, PO2  Studies/Results:  Anti-infectives: Anti-infectives    Start     Dose/Rate Route Frequency Ordered Stop   06/23/16 0809  cefoTEtan in Dextrose 5% (CEFOTAN) IVPB 2 g     2 g Intravenous On call to O.R. 06/23/16 0809 06/23/16 0958      Medications: Scheduled Meds: . enoxaparin (LOVENOX) injection  30 mg Subcutaneous Q12H  . famotidine (PEPCID) IV  20 mg Intravenous Q12H  . ketotifen  1 drop Both Eyes BID  . mouth rinse  15 mL Mouth Rinse BID  . protein supplement shake  2 oz Oral Q2H   Continuous Infusions: .  dextrose 5 % and 0.45 % NaCl with KCl 20 mEq/L 125 mL/hr at 06/24/16 1724   PRN Meds:.oxyCODONE **AND** acetaminophen, acetaminophen (TYLENOL) oral liquid 160 mg/5 mL, diphenhydrAMINE, fentaNYL (SUBLIMAZE) injection, HYDROmorphone (DILAUDID) injection, ondansetron (ZOFRAN) IV, promethazine, simethicone  Assessment/Plan: Patient Active Problem List   Diagnosis Date Noted  . Prediabetes 06/23/2016  . Hiatal hernia 06/23/2016  . Hypercholesterolemia with hypertriglyceridemia 06/23/2016  . 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 IIH (idiopathic intracranial hypertension) 03/03/2016  . Second degree hemorrhoids   . Headache 01/05/2016  . Chest pain 01/01/2016  . Pseudotumor cerebri 01/01/2016  . HTN (hypertension) 01/01/2016  . Obesity (BMI 30-39.9) 01/01/2016  . Asthma 01/01/2016  . GERD (gastroesophageal reflux disease) 01/01/2016  . Chest pain syndrome 01/01/2016  . Rectal bleeding 12/26/2015  . Chest discomfort 12/26/2015  . Frequent headaches 12/26/2015  . Pain in joint, lower leg 04/11/2014  . Stiffness of joint, not elsewhere classified, pelvic region and thigh 04/11/2014  . Difficulty in walking(719.7) 04/11/2014  . 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 Rhabdomyolysis 06/27/2013  . Hypokalemia 06/27/2013  . Bruising 06/27/2013  . ACHILLES TENDON RUPTURE 05/29/2008   s/p Procedure(s): LAPAROSCOPIC GASTRIC SLEEVE RESECTION WITH UPPER ENDO LAPAROSCOPIC REPAIR OF HIATAL HERNIA 06/23/2016  Cont chemical vte prophylaxis Cont antiemetics, simethicone as needed Cont pod 2 diet  Disposition:  LOS: 2 days  The patient does not meet criteria for discharge because She has persistant nausea and vomiting and is at high risk of dehydration   Leighton Ruff. Redmond Pulling, MD, FACS General, Bariatric, & Minimally Invasive Surgery [facility], [place]   [name], MD [phone] [facility]

## 2016-06-26 MED ORDER — OXYCODONE HCL 5 MG/5ML PO SOLN: 5.0000 mg | ORAL | 0 refills | Status: DC | PRN

## 2016-06-26 NOTE — Progress Notes (Signed)
Patient drinking chicken soup unjury in place of Premier Protein shake

## 2016-06-26 NOTE — Progress Notes (Signed)
Patient alert and oriented, pain is controlled. Patient is tolerating fluids, advanced to protein shake today, patient is tolerating well.  Reviewed Gastric sleeve discharge instructions with patient and patient is able to articulate understanding.  Provided information on BELT program, Support Group and [facility] outpatient pharmacy. All questions answered, will continue to monitor.  

## 2016-06-26 NOTE — Progress Notes (Signed)
Discharge instructions discussed with patient and family, verbalized agreement and understanding 

## 2016-06-27 NOTE — Discharge Summary (Signed)
Physician Discharge Summary  [name] [id number] DOB: 27-Nov-1975 DOA: 06/23/2016  PCP: [name], MD  Admit date: 06/23/2016 Discharge date: 06/26/2016  Recommendations for Outpatient Follow-up:   Follow-up Information    [name], MD. Go on 07/09/2016.   Specialty:  General Surgery Why:  at 11:30 for post-op check Contact information: [address] [place] [address] [phone]        Malcomb Gangemi M, MD .   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 Please see operative note.She was found to have an incidental hiatal hernia that we repaired at the time of surgery as well. Preoperatively the patient was given 5000 units of subcutaneous heparin for DVT prophylaxis. Postoperative prophylactic Lovenox dosing was started on the morning of postoperative day 1.  The patient was started on ice chips and water on the evening of postoperative day 0. On postoperative day 1 she was without tachycardia or fever however she was really not tolerating a lot of water. She had nausea and  epigastric discomfort. Her labs were within normal limits. She was maintained on water that day and not advanced to protein shakes and therefore not stable for discharge. On postoperative day 2 The patient's diet was advanced to protein shakes however she was still having a hard time getting in a fair amount of fluid. We did not believe she was safe for discharge since she was only getting in about 2-1/2 ounces of liquid. On postoperative day 3 her liquid intake had improved. The patient was ambulating without difficulty. Their vital signs are stable without fever or tachycardia. Their hemoglobin had remained stable.  The patient had received discharge instructions and counseling. They were deemed stable for discharge.  BP 122/72 (BP Location: Right Arm)   Pulse 73   Temp 98.4 F (36.9 C) (Oral)   Resp 16   Ht 5\' 10"  (1.778 m)   Wt 126 kg (277 lb 12.5 oz)   LMP 11/11/2011   SpO2 95%   BMI 39.86 kg/m   Gen: alert, NAD, non-toxic appearing Pupils: equal, no scleral icterus Pulm: Lungs clear to auscultation, symmetric chest rise CV: regular rate and rhythm Abd: soft, min tender, nondistended. Incisions c/d/i. No cellulitis. No incisional hernia Ext: no edema, no calf tenderness Skin: no rash, no jaundice   Discharge Instructions  Discharge Instructions    Ambulate hourly while awake    Complete by:  As directed    Call MD for:  difficulty breathing, headache or visual disturbances    Complete by:  As directed    Call MD for:  persistant dizziness or light-headedness  Complete by:  As directed    Call MD for:  persistant nausea and vomiting    Complete by:  As directed    Call MD for:  redness, tenderness, or signs of infection (pain, swelling, redness, odor or green/yellow discharge around incision site)    Complete by:  As directed    Call MD for:  severe uncontrolled pain    Complete by:  As directed    Call MD for:  temperature >101 F    Complete by:  As directed    Diet  bariatric full liquid    Complete by:  As directed    Discharge instructions    Complete by:  As directed    See bariatric discharge instructions   Incentive spirometry    Complete by:  As directed    Perform hourly while awake       Medication List    STOP taking these medications   aspirin 325 MG tablet   aspirin-acetaminophen-caffeine 250-250-65 MG tablet Commonly known as:  EXCEDRIN MIGRAINE   BC HEADACHE POWDER PO   ibuprofen 800 MG tablet Commonly known as:  ADVIL,MOTRIN     TAKE these medications   acetaZOLAMIDE 500 MG capsule Commonly known as:  DIAMOX Take 1 capsule (500 mg total) by mouth 2 (two) times daily. What changed:  when to take this  additional instructions   ALPRAZolam 1 MG tablet Commonly known as:  XANAX Take 1 mg by mouth 4 (four) times daily as needed for sleep or anxiety. Reported on 12/26/2015   amLODipine 5 MG tablet Commonly known as:  NORVASC Take 1 tablet (5 mg total) by mouth daily. Notes to patient:  Monitor Blood Pressure Daily and keep a log for primary care physician.  You may need to make changes to your medications with rapid weight loss.     atorvastatin 40 MG tablet Commonly known as:  LIPITOR Take 1 tablet (40 mg total) by mouth at bedtime. What changed:  when to take this   azelastine 0.05 % ophthalmic solution Commonly known as:  OPTIVAR Place 1 drop into both eyes 3 (three) times daily as needed.   dexlansoprazole 60 MG capsule Commonly known as:  DEXILANT Take 1 capsule (60 mg total) by mouth daily.   fluticasone 50 MCG/ACT nasal spray Commonly known as:  FLONASE Place 2 sprays into both nostrils daily.   levocetirizine 5 MG tablet Commonly known as:  XYZAL Take 5 mg by mouth daily.   montelukast 10 MG tablet Commonly known as:  SINGULAIR Take 10 mg by mouth daily.   oxyCODONE 5 MG/5ML solution Commonly known as:  ROXICODONE Take 5-10 mLs (5-10 mg total) by mouth every 4 (four) hours as needed for  moderate pain or severe pain.   topiramate 25 MG tablet Commonly known as:  TOPAMAX Take 25 mg by mouth daily.      Follow-up Information    [name], MD. Go on 07/09/2016.   Specialty:  General Surgery Why:  at 11:30 for post-op check Contact information: [address] [place] [address] [phone]        Brian Zeitlin M, MD .   Specialty:  General Surgery Contact information: [place]  [id number] [phone]            The results of significant diagnostics from this hospitalization (including imaging, microbiology, ancillary and laboratory) are listed below for reference.    Significant Diagnostic Studies: No results found.  Labs: Basic Metabolic Panel:  Recent Labs Lab 06/24/16 0504  NA 136  K 4.3  CL 105  CO2 24  GLUCOSE 142*  BUN 7  CREATININE 0.72  CALCIUM 9.0   Liver Function Tests:  Recent Labs Lab 06/24/16 0504  AST 56*  ALT 72*  ALKPHOS 48  BILITOT 0.3  PROT 7.7  ALBUMIN 3.9    CBC:  Recent Labs Lab 06/23/16 1249 06/24/16 0504 06/24/16 1636 06/25/16 0446  WBC  --  13.6*  --  11.0*  NEUTROABS  --  11.5*  --  7.1  HGB 12.7 12.9 12.5 12.1  HCT 37.2 39.1 38.4 37.1  MCV  --  87.1  --  88.8  PLT  --  370  --  339    CBG:  Recent Labs Lab 06/23/16 0803  GLUCAP 121*    Principal Problem:   Obesity (BMI 30-39.9) Active Problems:   Pseudotumor cerebri   HTN (hypertension)   GERD (gastroesophageal reflux disease)   IIH (idiopathic intracranial hypertension)   Prediabetes   Hiatal hernia   Hypercholesterolemia with hypertriglyceridemia   S/P laparoscopic sleeve gastrectomy   Time coordinating discharge: 20 min  Signed:  [name], MD [facility] [place] 06/27/2016, 8:58 AM

## 2016-07-08 ENCOUNTER — Encounter: Payer: BLUE CROSS/BLUE SHIELD | Attending: General Surgery | Admitting: Dietician

## 2016-07-08 DIAGNOSIS — E669 Obesity, unspecified: Secondary | ICD-10-CM

## 2016-07-08 DIAGNOSIS — Z6835 Body mass index (BMI) 35.0-35.9, adult: Secondary | ICD-10-CM | POA: Insufficient documentation

## 2016-07-08 DIAGNOSIS — I10 Essential (primary) hypertension: Secondary | ICD-10-CM | POA: Diagnosis not present

## 2016-07-08 DIAGNOSIS — Z713 Dietary counseling and surveillance: Secondary | ICD-10-CM | POA: Insufficient documentation

## 2016-07-09 ENCOUNTER — Other Ambulatory Visit: Payer: Self-pay | Admitting: General Surgery

## 2016-07-09 ENCOUNTER — Ambulatory Visit: Payer: Self-pay | Admitting: General Surgery

## 2016-07-09 ENCOUNTER — Other Ambulatory Visit (HOSPITAL_COMMUNITY): Payer: Self-pay | Admitting: General Surgery

## 2016-07-09 DIAGNOSIS — R109 Unspecified abdominal pain: Secondary | ICD-10-CM

## 2016-07-09 NOTE — Progress Notes (Signed)
Bariatric Class:  Appt start time: 1530 end time:  1630.  2 Week Post-Operative Nutrition Class  Patient was seen on 07/09/2016 for Post-Operative Nutrition [facility].   Surgery date: 06/13/2016 Surgery type: sleeve gastrectomy Start weight at [facility]: 261 lbs on 05/14/2016 Weight today: 250.8 lbs  Weight change: 12.8 lbs  TANITA  BODY COMP RESULTS  06/10/16 07/09/16   BMI (kg/m^2) 38.9 49.5   Fat Mass (lbs) 136 124.2   Fat Free Mass (lbs) 127.6 126.6   Total Body Water (lbs) 94.4 93.2   The following the learning objectives were met by the patient during this course:  Identifies Phase 3A (Soft, High Proteins) Dietary Goals and will begin from 2 weeks post-operatively to 2 months post-operatively  Identifies appropriate sources of fluids and proteins   States protein recommendations and appropriate sources post-operatively  Identifies the need for appropriate texture modifications, mastication, and bite sizes when consuming solids  Identifies appropriate multivitamin and calcium sources post-operatively  Describes the need for physical activity post-operatively and will follow MD recommendations  States when to call healthcare provider regarding medication questions or post-operative complications  Handouts given during class include:  Phase 3A: Soft, High Protein Diet Handout  Follow-Up Plan: Patient will follow-up at [facility] in 6 weeks for 2 month post-op nutrition visit for diet advancement per MD.

## 2016-07-10 ENCOUNTER — Ambulatory Visit (HOSPITAL_COMMUNITY)
Admission: RE | Admit: 2016-07-10 | Discharge: 2016-07-10 | Disposition: A | Discharge status: Home / Self Care | Payer: BLUE CROSS/BLUE SHIELD | Source: Ambulatory Visit | Attending: Internal Medicine | Admitting: Internal Medicine

## 2016-07-10 DIAGNOSIS — E860 Dehydration: Secondary | ICD-10-CM | POA: Diagnosis not present

## 2016-07-10 LAB — COMPREHENSIVE METABOLIC PANEL WITH GFR
ALT: 22 U/L (ref 14–54)
AST: 22 U/L (ref 15–41)
Albumin: 4.2 g/dL (ref 3.5–5.0)
Alkaline Phosphatase: 56 U/L (ref 38–126)
Anion gap: 8 (ref 5–15)
BUN: 16 mg/dL (ref 6–20)
CO2: 20 mmol/L — ABNORMAL LOW (ref 22–32)
Calcium: 9 mg/dL (ref 8.9–10.3)
Chloride: 109 mmol/L (ref 101–111)
Creatinine, Ser: 0.74 mg/dL (ref 0.44–1.00)
GFR calc Af Amer: >60 mL/min
GFR calc non Af Amer: >60 mL/min
Glucose, Bld: 87 mg/dL (ref 65–99)
Potassium: 3.6 mmol/L (ref 3.5–5.1)
Sodium: 137 mmol/L (ref 135–145)
Total Bilirubin: 0.7 mg/dL (ref 0.3–1.2)
Total Protein: 7.7 g/dL (ref 6.5–8.1)

## 2016-07-10 MED ORDER — THIAMINE HCL 100 MG/ML IJ SOLN
INTRAVENOUS | Status: AC
  Administered 2016-07-10: 10:00:00 via INTRAVENOUS
  Filled 2016-07-10 (×2): qty 1000

## 2016-07-10 NOTE — Discharge Instructions (Signed)

## 2016-07-10 NOTE — Procedures (Signed)
[facility]  Procedure Note  [name] [id number] DOB: [birth year] DOA: 07/10/2016   Ordering Provider: Gurney Maxin, MD  Associated Diagnosis: Fluid replacement  Procedure Note: IV infusion of fluids with multivitamins, thiamine, folic acid, NS   Condition During Procedure: Pt tolerated well   Condition at Discharge: Pt alert, ambulatory; no complications noted   Nigel Sloop, [facility]

## 2016-07-11 ENCOUNTER — Ambulatory Visit (HOSPITAL_COMMUNITY)
Admission: RE | Admit: 2016-07-11 | Discharge: 2016-07-11 | Disposition: A | Discharge status: Home / Self Care | Payer: BLUE CROSS/BLUE SHIELD | Source: Ambulatory Visit | Attending: General Surgery | Admitting: General Surgery

## 2016-07-11 DIAGNOSIS — R109 Unspecified abdominal pain: Secondary | ICD-10-CM | POA: Insufficient documentation

## 2016-07-11 DIAGNOSIS — K449 Diaphragmatic hernia without obstruction or gangrene: Secondary | ICD-10-CM | POA: Diagnosis not present

## 2016-07-11 DIAGNOSIS — K219 Gastro-esophageal reflux disease without esophagitis: Secondary | ICD-10-CM | POA: Insufficient documentation

## 2016-07-14 ENCOUNTER — Other Ambulatory Visit: Payer: Self-pay | Admitting: General Surgery

## 2016-07-15 ENCOUNTER — Encounter (HOSPITAL_COMMUNITY): Payer: Self-pay | Admitting: General Practice

## 2016-07-15 ENCOUNTER — Inpatient Hospital Stay (HOSPITAL_COMMUNITY)
Admission: AD | Admit: 2016-07-15 | Discharge: 2016-07-17 | DRG: 392 | Disposition: A | Discharge status: Home / Self Care | Payer: BLUE CROSS/BLUE SHIELD | Source: Ambulatory Visit | Attending: General Surgery | Admitting: General Surgery

## 2016-07-15 ENCOUNTER — Other Ambulatory Visit: Payer: Self-pay | Admitting: General Surgery

## 2016-07-15 DIAGNOSIS — E7800 Pure hypercholesterolemia, unspecified: Secondary | ICD-10-CM | POA: Diagnosis present

## 2016-07-15 DIAGNOSIS — Z79899 Other long term (current) drug therapy: Secondary | ICD-10-CM

## 2016-07-15 DIAGNOSIS — K219 Gastro-esophageal reflux disease without esophagitis: Principal | ICD-10-CM | POA: Diagnosis present

## 2016-07-15 DIAGNOSIS — Z9884 Bariatric surgery status: Secondary | ICD-10-CM

## 2016-07-15 DIAGNOSIS — I10 Essential (primary) hypertension: Secondary | ICD-10-CM | POA: Diagnosis present

## 2016-07-15 DIAGNOSIS — E441 Mild protein-calorie malnutrition: Secondary | ICD-10-CM | POA: Diagnosis present

## 2016-07-15 DIAGNOSIS — Z6837 Body mass index (BMI) 37.0-37.9, adult: Secondary | ICD-10-CM

## 2016-07-15 DIAGNOSIS — R7303 Prediabetes: Secondary | ICD-10-CM | POA: Diagnosis present

## 2016-07-15 DIAGNOSIS — E669 Obesity, unspecified: Secondary | ICD-10-CM | POA: Diagnosis present

## 2016-07-15 DIAGNOSIS — R1110 Vomiting, unspecified: Secondary | ICD-10-CM | POA: Diagnosis present

## 2016-07-15 DIAGNOSIS — Z9049 Acquired absence of other specified parts of digestive tract: Secondary | ICD-10-CM

## 2016-07-15 DIAGNOSIS — G932 Benign intracranial hypertension: Secondary | ICD-10-CM | POA: Diagnosis present

## 2016-07-15 HISTORY — DX: Pure hypercholesterolemia, unspecified: E78.00

## 2016-07-15 LAB — COMPREHENSIVE METABOLIC PANEL
ALK PHOS: 53 U/L (ref 38–126)
ALT: 19 U/L (ref 14–54)
AST: 20 U/L (ref 15–41)
Albumin: 3.7 g/dL (ref 3.5–5.0)
Anion gap: 7 (ref 5–15)
BUN: 16 mg/dL (ref 6–20)
CALCIUM: 9.2 mg/dL (ref 8.9–10.3)
CO2: 23 mmol/L (ref 22–32)
CREATININE: 0.72 mg/dL (ref 0.44–1.00)
Chloride: 109 mmol/L (ref 101–111)
GFR calc non Af Amer: >60 mL/min (ref >60)
Glucose, Bld: 78 mg/dL (ref 65–99)
Potassium: 3.7 mmol/L (ref 3.5–5.1)
SODIUM: 139 mmol/L (ref 135–145)
Total Bilirubin: 0.5 mg/dL (ref 0.3–1.2)
Total Protein: 6.7 g/dL (ref 6.5–8.1)

## 2016-07-15 LAB — CBC WITH DIFFERENTIAL/PLATELET
Basophils Absolute: 0 10*3/uL (ref 0.0–0.1)
Basophils Relative: 0 %
EOS ABS: 0.4 10*3/uL (ref 0.0–0.7)
Eosinophils Relative: 6 %
HCT: 36 % (ref 36.0–46.0)
HEMOGLOBIN: 11.9 g/dL — AB (ref 12.0–15.0)
Lymphocytes Relative: 39 %
Lymphs Abs: 2.4 10*3/uL (ref 0.7–4.0)
MCH: 28.5 pg (ref 26.0–34.0)
MCHC: 33.1 g/dL (ref 30.0–36.0)
MCV: 86.1 fL (ref 78.0–100.0)
MONOS PCT: 6 %
Monocytes Absolute: 0.4 10*3/uL (ref 0.1–1.0)
NEUTROS ABS: 3.1 10*3/uL (ref 1.7–7.7)
NEUTROS PCT: 49 %
Platelets: 381 10*3/uL (ref 150–400)
RBC: 4.18 MIL/uL (ref 3.87–5.11)
RDW: 13.7 % (ref 11.5–15.5)
WBC: 6.2 10*3/uL (ref 4.0–10.5)

## 2016-07-15 LAB — MAGNESIUM: Magnesium: 1.9 mg/dL (ref 1.7–2.4)

## 2016-07-15 LAB — PHOSPHORUS: PHOSPHORUS: 3.5 mg/dL (ref 2.5–4.6)

## 2016-07-15 MED ORDER — ONDANSETRON 4 MG PO TBDP: 4.0000 mg | ORAL_TABLET | Freq: Four times a day (QID) | ORAL | Status: DC | PRN

## 2016-07-15 MED ORDER — THIAMINE HCL 100 MG/ML IJ SOLN
Freq: Once | INTRAVENOUS | Status: AC
  Administered 2016-07-15: 18:00:00 via INTRAVENOUS
  Filled 2016-07-15: qty 1000

## 2016-07-15 MED ORDER — ONDANSETRON HCL 4 MG/2ML IJ SOLN
4.0000 mg | Freq: Four times a day (QID) | INTRAMUSCULAR | Status: DC | PRN
  Administered 2016-07-16: 4 mg via INTRAVENOUS
  Filled 2016-07-15: qty 2

## 2016-07-15 MED ORDER — ENOXAPARIN SODIUM 40 MG/0.4ML ~~LOC~~ SOLN
40.0000 mg | SUBCUTANEOUS | Status: DC
  Administered 2016-07-16: 40 mg via SUBCUTANEOUS
  Filled 2016-07-15: qty 0.4

## 2016-07-15 MED ORDER — PROMETHAZINE HCL 25 MG/ML IJ SOLN: 12.5000 mg | Freq: Four times a day (QID) | INTRAMUSCULAR | Status: DC | PRN

## 2016-07-15 MED ORDER — FAMOTIDINE IN NACL 20-0.9 MG/50ML-% IV SOLN
20.0000 mg | Freq: Two times a day (BID) | INTRAVENOUS | Status: DC
  Administered 2016-07-15 – 2016-07-16 (×2): 20 mg via INTRAVENOUS
  Filled 2016-07-15 (×3): qty 50

## 2016-07-15 MED ORDER — DIPHENHYDRAMINE HCL 50 MG/ML IJ SOLN: 12.5000 mg | Freq: Four times a day (QID) | INTRAMUSCULAR | Status: DC | PRN

## 2016-07-15 MED ORDER — DIPHENHYDRAMINE HCL 12.5 MG/5ML PO ELIX: 12.5000 mg | ORAL_SOLUTION | Freq: Four times a day (QID) | ORAL | Status: DC | PRN

## 2016-07-16 DIAGNOSIS — Z6837 Body mass index (BMI) 37.0-37.9, adult: Secondary | ICD-10-CM | POA: Diagnosis not present

## 2016-07-16 DIAGNOSIS — I10 Essential (primary) hypertension: Secondary | ICD-10-CM | POA: Diagnosis present

## 2016-07-16 DIAGNOSIS — R7303 Prediabetes: Secondary | ICD-10-CM | POA: Diagnosis present

## 2016-07-16 DIAGNOSIS — G932 Benign intracranial hypertension: Secondary | ICD-10-CM | POA: Diagnosis present

## 2016-07-16 DIAGNOSIS — E669 Obesity, unspecified: Secondary | ICD-10-CM | POA: Diagnosis present

## 2016-07-16 DIAGNOSIS — E7800 Pure hypercholesterolemia, unspecified: Secondary | ICD-10-CM | POA: Diagnosis present

## 2016-07-16 DIAGNOSIS — E441 Mild protein-calorie malnutrition: Secondary | ICD-10-CM | POA: Diagnosis present

## 2016-07-16 DIAGNOSIS — Z9884 Bariatric surgery status: Secondary | ICD-10-CM | POA: Diagnosis not present

## 2016-07-16 DIAGNOSIS — R1013 Epigastric pain: Secondary | ICD-10-CM | POA: Diagnosis present

## 2016-07-16 DIAGNOSIS — Z79899 Other long term (current) drug therapy: Secondary | ICD-10-CM | POA: Diagnosis not present

## 2016-07-16 DIAGNOSIS — Z9049 Acquired absence of other specified parts of digestive tract: Secondary | ICD-10-CM | POA: Diagnosis not present

## 2016-07-16 DIAGNOSIS — K219 Gastro-esophageal reflux disease without esophagitis: Secondary | ICD-10-CM | POA: Diagnosis present

## 2016-07-16 LAB — COMPREHENSIVE METABOLIC PANEL
ALBUMIN: 3.3 g/dL — AB (ref 3.5–5.0)
ALK PHOS: 51 U/L (ref 38–126)
ALT: 17 U/L (ref 14–54)
AST: 18 U/L (ref 15–41)
Anion gap: 7 (ref 5–15)
BILIRUBIN TOTAL: 0.7 mg/dL (ref 0.3–1.2)
BUN: 14 mg/dL (ref 6–20)
CALCIUM: 8.8 mg/dL — AB (ref 8.9–10.3)
CO2: 24 mmol/L (ref 22–32)
CREATININE: 0.77 mg/dL (ref 0.44–1.00)
Chloride: 110 mmol/L (ref 101–111)
GFR calc Af Amer: >60 mL/min (ref >60)
GFR calc non Af Amer: >60 mL/min (ref >60)
GLUCOSE: 70 mg/dL (ref 65–99)
Potassium: 3.4 mmol/L — ABNORMAL LOW (ref 3.5–5.1)
SODIUM: 141 mmol/L (ref 135–145)
TOTAL PROTEIN: 6.5 g/dL (ref 6.5–8.1)

## 2016-07-16 MED ORDER — INSULIN ASPART 100 UNIT/ML ~~LOC~~ SOLN
0.0000 [IU] | Freq: Four times a day (QID) | SUBCUTANEOUS | Status: DC
  Administered 2016-07-17: 1 [IU] via SUBCUTANEOUS

## 2016-07-16 MED ORDER — TRACE MINERALS CR-CU-MN-SE-ZN 10-1000-500-60 MCG/ML IV SOLN
INTRAVENOUS | Status: DC
  Administered 2016-07-16: 19:00:00 via INTRAVENOUS
  Filled 2016-07-16: qty 960

## 2016-07-16 MED ORDER — PREMIER PROTEIN SHAKE
2.0000 [oz_av] | Freq: Four times a day (QID) | ORAL | Status: DC
  Administered 2016-07-16 – 2016-07-17 (×4): 2 [oz_av] via ORAL
  Filled 2016-07-16: qty 325.31

## 2016-07-16 MED ORDER — ENOXAPARIN SODIUM 30 MG/0.3ML ~~LOC~~ SOLN
30.0000 mg | Freq: Two times a day (BID) | SUBCUTANEOUS | Status: DC
  Administered 2016-07-16 – 2016-07-17 (×2): 30 mg via SUBCUTANEOUS
  Filled 2016-07-16 (×2): qty 0.3

## 2016-07-16 MED ORDER — SODIUM CHLORIDE 0.9% FLUSH: 10.0000 mL | INTRAVENOUS | Status: DC | PRN

## 2016-07-16 MED ORDER — METOCLOPRAMIDE HCL 5 MG/5ML PO SOLN
10.0000 mg | Freq: Three times a day (TID) | ORAL | Status: DC
  Administered 2016-07-16 – 2016-07-17 (×2): 10 mg via ORAL
  Filled 2016-07-16 (×2): qty 10

## 2016-07-16 MED ORDER — POTASSIUM CHLORIDE 10 MEQ/50ML IV SOLN
10.0000 meq | INTRAVENOUS | Status: AC
  Administered 2016-07-16 (×4): 10 meq via INTRAVENOUS
  Filled 2016-07-16 (×4): qty 50

## 2016-07-16 MED ORDER — FLUCONAZOLE 100 MG PO TABS
150.0000 mg | ORAL_TABLET | Freq: Once | ORAL | Status: AC
  Administered 2016-07-16: 150 mg via ORAL
  Filled 2016-07-16: qty 2

## 2016-07-16 MED ORDER — INFLUENZA VAC SPLIT QUAD 0.5 ML IM SUSY
0.5000 mL | PREFILLED_SYRINGE | Freq: Once | INTRAMUSCULAR | Status: AC
  Administered 2016-07-17: 0.5 mL via INTRAMUSCULAR

## 2016-07-16 MED ORDER — FAT EMULSION 20 % IV EMUL
240.0000 mL | INTRAVENOUS | Status: DC
  Administered 2016-07-16: 240 mL via INTRAVENOUS
  Filled 2016-07-16: qty 250

## 2016-07-16 MED ORDER — POTASSIUM CHLORIDE 10 MEQ/100ML IV SOLN: 10.0000 meq | INTRAVENOUS | Status: DC

## 2016-07-16 MED ORDER — SUCRALFATE 1 GM/10ML PO SUSP
1.0000 g | Freq: Three times a day (TID) | ORAL | Status: DC
  Administered 2016-07-16 – 2016-07-17 (×4): 1 g via ORAL
  Filled 2016-07-16 (×4): qty 10

## 2016-07-16 MED ORDER — INFLUENZA VAC SPLIT QUAD 0.5 ML IM SUSY: 0.5000 mL | PREFILLED_SYRINGE | INTRAMUSCULAR | Status: DC

## 2016-07-16 NOTE — Progress Notes (Signed)
[facility] NOTE   Pharmacy Consult for TPN Indication: intolerance to enteral feeding  Patient Measurements: Height: 5\' 9"  (175.3 cm) Weight: 250 lb 14.1 oz (113.8 kg) IBW/kg (Calculated) : 66.2 TPN AdjBW (KG): 78.1 Body mass index is 37.05 kg/m.  Assessment: 40 yo F s/p laparoscopic sleeve gastrectomy with hiatal hernia repair and upper endoscopy 9/18 at [facility].  Re-admitted with intolerance to enteral feeding 10/11.  Plans to get PICC line and start TPN with plans for home TPN with [facility] to manage.   GI: s/p lap sleeve gastrectomy 06/23/16 at [facility], dc'd 9/21; GERD on dexilant PTA, IV H2 now Endo:prediabetes per MD note, empiric SSI ordered Insulin requirements in the past 24 hours:none Lytes: K 3.4, phos 3.5, mag 1.9 IVF NS w thiamine, MVI, folic acid ordered for 125 ml/hr but charted in EPIC as stopped 1 minute after started Renal: Pulm: Cards:HTN, Hypercholesterolemia with hypertriglyceridemia Hepatobil: LFTs WNL, alb 3.3 Neuro:Pseudotumor cerebri,   IIH (idiopathic intracranial hypertension) ID:  Best Practices: TPN Access: PICC placed 10/11 for TPN TPN start date: 10/11  Nutritional Goals (per RD recommendation on 10/11): kCal: Protein:   Current Nutrition:   Plan:  start Clinimix E 5/15 at 40 ml/hr start 20% lipid emulsion at 10 ml/hr This provides 48 g of protein and 1162 kCals per day Add pepcid 40 mg/day and DC boluses (d/w CCS PA) Add thiamine and folic acid as MD added to first bag of IVF  4 runs of KCL  MVI and trace elements in TPN sensitive SSI and adjust as needed F/u w/ unit RD for goals Monitor TPN labs  Eudelia Bunch, Pharm.D. QP:[id number] 07/16/2016 10:54 AM

## 2016-07-16 NOTE — Progress Notes (Addendum)
Initial Nutrition Assessment  DOCUMENTATION CODES:   Obesity unspecified  INTERVENTION:   -TPN management per pharmacy -Premier Protein supplement or equivalent BID -Recommend at discharge:   -350-500 mcg sublingual B-12 daily  -2 MVI (Centrum chewable or equivalent) daily  -1500 mg calcium citrate daily  NUTRITION DIAGNOSIS:   Inadequate oral intake related to altered GI function as evidenced by meal completion < 25%.  GOAL:   Patient will meet greater than or equal to 90% of their needs  MONITOR:   PO intake, Supplement acceptance, Diet advancement, Labs, Weight trends, Skin, I & O's  REASON FOR ASSESSMENT:   Consult Assessment of nutrition requirement/status  ASSESSMENT:   Pt directly admitted from surgeon's office related to poor oral intake and dehydration s/p recent gastric sleeve.   Pt admitted with dehydration secondary to poor oral intake s/p recent gastric sleeve.   Hx obtained from pt at bedside, who confirms undergoing gastric sleeve on 06/23/16. Pt expresses concern over poor oral intake over the past 3 weeks. Per pt report, she was consuming 2 Premier Protein shakes daily (320 kcals and 60 grams protein daily) and clears liquids such as chicken broth, jello, and miso soup. She reports that she has always had a hx of acid reflux, however, this has worsened greatly since surgery. Pt reveals that it is very painful to eat and most PO intake "comes right back up". She further explains that her surgeon told her that stomach and surgical site were inflamed.   Pt estimates that she has lost approximately 30# since surgery. Per wt hx, pt has lost 14# (5.3%) within the past month.   Pt expressed concern over "not feeling normal". She reports compliance with bariatric diet and confirms she recently saw bariatric RD at [facility] last week. Provided pt with emotional support.   Nutrition-Focused physical exam completed. Findings are no fat depletion, no muscle depletion, and  no edema.   Case discussed with RN. Pt has PICC line placed today with plans to start TPN tonight. Per orders, will initiate Clinimix E 5/15 @ 40 ml/hr with 20% IVFE @ 10 ml/hr, which will provides 1162 kcals and 48 grams protein (meeting approximately 73% of estimated kcal needs and 51% of estimated protein needs daily).   Case discussed with [facility] Clinical Liaison, who reported plan to d/c home tonight to initiate TPN via [place].   Labs reviewed: K: 3.4. Mg and Phos WDL.   Diet Order:  Diet bariatric clear liquid Room service appropriate? Yes; Fluid consistency: Thin TPN (CLINIMIX-E) Adult  Skin:  Reviewed, no issues  Last BM:  PTA  Height:   Ht Readings from Last 1 Encounters:  07/15/16 5\' 9"  (1.753 m)    Weight:   Wt Readings from Last 1 Encounters:  07/15/16 250 lb 14.1 oz (113.8 kg)    Ideal Body Weight:  65.9 kg  BMI:  Body mass index is 37.05 kg/m.  Estimated Nutritional Needs:   Kcal:  1600-1800  Protein:  >95 grams  Fluid:  1.5-2.0 L  EDUCATION NEEDS:   Education needs addressed  Roscoe Witts A. Jimmye Norman, RD, LDN, CDE Pager: [phone] After hours Pager: [phone]

## 2016-07-16 NOTE — Care Management Note (Signed)
Case Management Note  Patient Details  Name: LUSIA COFFEL MRN: [id number] Date of Birth: 03/01/1976  Subjective/Objective:                    Action/Plan:  Discussed home health RN / TNA . Patient aware she will be shown how to administer TNA , she voiced agreement and understanding.  Patient's discharge address will be :   [place], [place] [phone] ( second floor)   Husband's cell is [phone] , patient's cell is [phone] Expected Discharge Date:                  Expected Discharge Plan:  [place]  In-House Referral:     Discharge planning Services  CM Consult  Post Acute Care Choice:  Home Health Choice offered to:  Patient  DME Arranged:    DME Agency:     HH Arranged:  RN [place] Agency:  [place]  Status of Service:  Completed, signed off  If discussed at [place] of Stay Meetings, dates discussed:    Additional Comments:  Marilu Favre, RN 07/16/2016, 10:47 AM

## 2016-07-16 NOTE — Progress Notes (Signed)
Peripherally Inserted Central Catheter/Midline Placement  The IV Nurse has discussed with the patient and/or persons authorized to consent for the patient, the purpose of this procedure and the potential benefits and risks involved with this procedure.  The benefits include less needle sticks, lab draws from the catheter, and the patient may be discharged home with the catheter. Risks include, but not limited to, infection, bleeding, blood clot (thrombus formation), and puncture of an artery; nerve damage and irregular heartbeat and possibility to perform a PICC exchange if needed/ordered by physician.  Alternatives to this procedure were also discussed.  Bard Power PICC patient education guide, fact sheet on infection prevention and patient information card has been provided to patient /or left at bedside.    PICC/Midline Placement Documentation        Darlyn Read 07/16/2016, 12:11 PM

## 2016-07-16 NOTE — Progress Notes (Signed)
Patient ID: [name], female   DOB: 10/24/1975, 40 y.o.   MRN: [id number]  Progress Note: Metabolic and Bariatric Surgery Service   Subjective: Still with ongoing gerd and frequent issues of bile refluxing into mouth. No abd pain except when try to drink a higher volume of liquids. Was able to get down a protein shake but it was very uncomfortable like it has been, feels like it just sits there. Got picc  Objective: Vital signs in last 24 hours: Temp:  [98.3 F (36.8 C)-98.7 F (37.1 C)] 98.6 F (37 C) (10/11 2127) Pulse Rate:  [68-76] 68 (10/11 2127) Resp:  [17-18] 17 (10/11 2127) BP: (121-129)/(55-82) 121/77 (10/11 2127) SpO2:  [100 %] 100 % (10/11 2127)    Intake/Output from previous day: 10/10 0701 - 10/11 0700 In: 50 [IV Piggyback:50] Out: 100 [Urine:100] Intake/Output this shift: Total I/O In: -  Out: 250 [Urine:250]  Lungs: cta  Cardiovascular: reg  Abd: soft nt, nd  Extremities: no edema  Neuro: alert not ill appearing  Lab Results: CBC   Recent Labs  07/15/16 1628  WBC 6.2  HGB 11.9*  HCT 36.0  PLT 381   BMET  Recent Labs  07/15/16 1628 07/16/16 0335  NA 139 141  K 3.7 3.4*  CL 109 110  CO2 23 24  GLUCOSE 78 70  BUN 16 14  CREATININE 0.72 0.77  CALCIUM 9.2 8.8*   PT/INR No results for input(s): LABPROT, INR in the last 72 hours. ABG No results for input(s): PHART, HCO3 in the last 72 hours.  Invalid input(s): PCO2, PO2  Studies/Results:  Anti-infectives: Anti-infectives    Start     Dose/Rate Route Frequency Ordered Stop   07/16/16 1515  fluconazole (DIFLUCAN) tablet 150 mg     150 mg Oral  Once 07/16/16 1500 07/16/16 1607      Medications: Scheduled Meds: . enoxaparin (LOVENOX) injection  30 mg Subcutaneous Q12H  . Influenza vac split quadrivalent PF  0.5 mL Intramuscular Once  . [START ON 07/17/2016] insulin aspart  0-9 Units Subcutaneous Q6H  . metoCLOPramide  10 mg Oral Q8H  . protein supplement shake  2 oz Oral  QID  . sucralfate  1 g Oral TID WC & HS   Continuous Infusions: . Marland KitchenTPN (CLINIMIX-E) Adult 40 mL/hr at 07/16/16 1837   And  . fat emulsion 240 mL (07/16/16 1837)   PRN Meds:.diphenhydrAMINE **OR** diphenhydrAMINE, ondansetron **OR** ondansetron (ZOFRAN) IV, promethazine, sodium chloride flush  Assessment/Plan: Patient Active Problem List   Diagnosis Date Noted  . Vomiting 07/15/2016  . Dehydration 07/10/2016  . Prediabetes 06/23/2016  . Hiatal hernia 06/23/2016  . Hypercholesterolemia with hypertriglyceridemia 06/23/2016  . 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 IIH (idiopathic intracranial hypertension) 03/03/2016  . Second degree hemorrhoids   . Headache 01/05/2016  . Chest pain 01/01/2016  . Pseudotumor cerebri 01/01/2016  . HTN (hypertension) 01/01/2016  . Obesity (BMI 30-39.9) 01/01/2016  . Asthma 01/01/2016  . GERD (gastroesophageal reflux disease) 01/01/2016  . Chest pain syndrome 01/01/2016  . Rectal bleeding 12/26/2015  . Chest discomfort 12/26/2015  . Frequent headaches 12/26/2015  . Pain in joint, lower leg 04/11/2014  . Stiffness of joint, not elsewhere classified, pelvic region and thigh 04/11/2014  . Difficulty in walking(719.7) 04/11/2014  . 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 Rhabdomyolysis 06/27/2013  . Hypokalemia 06/27/2013  . Bruising 06/27/2013  . ACHILLES TENDON RUPTURE 05/29/2008   S/p laparoscopic sleeve gastrectomy with hiatal hernia repair 06/23/2016 Severe  GERD Epigastric pain Poor oral intake  Long discussion with pt.  Again think this will settle down but may take time. In interim will support with IVF and TPN to maintain nutritional status and promote ongoing healing.  Told pt not to force liquids. - if having that much discomfort - not to push it Will add carafate to see if that will help with reflux/burning sensation  If symptoms persists for another 2 weeks or so, next step will be upper endoscopy.  Disposition:  plan home prob Thursday with picc/ home health ivf/tpn  Leighton Ruff. Redmond Pulling, MD, FACS General, Bariatric, & Minimally Invasive Surgery [facility]   LOS: 1 day    [name], MD [phone] [facility]

## 2016-07-16 NOTE — H&P (Addendum)
[name] is an 40 y.o. female.   DELAYED NOTE ENTRY. Pt seen and examined 07/15/16 at 4pm  Chief Complaint: reflux, difficult with po HPI:40 yo obese AAF s/p laparoscopic sleeve gastrectomy with hiatal hernia repair 9/18 has had ongoing difficulty with oral intake was brought in for IV fluids and initiation of TPN. I had seen her in the office last week where she relayed she had had difficulty in drinking liquids. It took 'a lot of effort' to even get down 2 protein shakes a day. Had bad reflux, occasional regurgitation, feeling of acid coming back up into mouth frequently, pain in upper abd with oral intake but no fever/chills. She didn't appear toxic in clinic so she went sent for outpt IVF and UGI. UGI showed narrowing in upper sleeve (6-78m) and significant reflux, no leak. When contacted by phone earlier today to check on progress, She reported ongoing difficulty with liquid intake, weakness, severe reflux. At this point I recommended direct admission for IVF, PICC, alternative nutrition.   No fever, chills, hematemesis, NSAIDs, melena/hematochezia, dysuria.   Past Medical History:  Diagnosis Date  . Anemia   . Anginal pain ([place]    Pt has chest pains but pt states she was informed is related to acid reflux  . Asthma    pt denies  . Chronic abdominal pain   . Chronic bronchitis ([place]   . Chronic constipation   . Chronic lower back pain   . GERD (gastroesophageal reflux disease)   . High cholesterol   . 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 History of hiatal hernia   . History of urinary tract infection   . Hypertension   . Migraine    "3-4 times/month" (07/15/2016)  . Pneumonia ~ 2000 X 1  . Pre-diabetes   . Pseudotumor cerebri dx'd ealy 2000s  . Seizures ([place]    in childhood / teenage years   . Vision disturbance    "related to PTC"    Past Surgical History:  Procedure Laterality Date  . ABDOMINAL HYSTERECTOMY  "early 2000s"    "partial"  . ACHILLES TENDON LENGTHENING Left   . COLONOSCOPY  2008   Dr. RGala Romney friable anal canal, otherwise normal  . COLONOSCOPY N/A 01/23/2016   RTIR:WERXVQMGhemorrhoids  . DILATION AND CURETTAGE OF UTERUS    . ESOPHAGOGASTRODUODENOSCOPY  2009   Dr. RGala Romney normal   . ESOPHAGOGASTRODUODENOSCOPY N/A 01/23/2016   RQQP:YPPJKDesophagus/small HH  . HERNIA REPAIR    . HIATAL HERNIA REPAIR  06/23/2016   Procedure: LAPAROSCOPIC REPAIR OF HIATAL HERNIA;  Surgeon: EGreer Pickerel MD;  Location: [facility];  Service: General;;  . LAPAROSCOPIC CHOLECYSTECTOMY    . LAPAROSCOPIC GASTRIC SLEEVE RESECTION N/A 06/23/2016   Procedure: LAPAROSCOPIC GASTRIC SLEEVE RESECTION WITH UPPER ENDO;  Surgeon: EGreer Pickerel MD;  Location: [facility];  Service: General;  Laterality: N/A;  . LUMBAR PUNCTURE  "several since early 20's"  . vaginal trauma     secondary to fall in childhood / did have to have surgery pt not sure of details    Family History  Problem Relation Age of Onset  . Migraines Mother   . Cancer Maternal Uncle   . Cancer Maternal Uncle   . Cancer Paternal Grandmother   . Cancer Paternal Grandfather   . Colon cancer      unknown   Social History:  reports that she has never smoked. She has never used smokeless tobacco.  She reports that she drinks alcohol. She reports that she uses drugs, including Marijuana.  Allergies: No Known Allergies  Medications Prior to Admission  Medication Sig Dispense Refill  . amLODipine (NORVASC) 5 MG tablet Take 1 tablet (5 mg total) by mouth daily. 30 tablet 3  . atorvastatin (LIPITOR) 40 MG tablet Take 1 tablet (40 mg total) by mouth at bedtime. (Patient taking differently: Take 40 mg by mouth every morning. ) 30 tablet 3  . azelastine (OPTIVAR) 0.05 % ophthalmic solution Place 1 drop into both eyes 3 (three) times daily as needed.     Marland Kitchen BIOTIN PO Take 1 tablet by mouth daily.    Marland Kitchen CALCIUM PO Take 1 tablet by mouth 3 (three) times daily.    . cyanocobalamin (,VITAMIN  B-12,) 1000 MCG/ML injection Inject 1,000 mcg into the muscle every 30 (thirty) days.    Marland Kitchen dexlansoprazole (DEXILANT) 60 MG capsule Take 1 capsule (60 mg total) by mouth daily. 90 capsule 1  . fluticasone (FLONASE) 50 MCG/ACT nasal spray Place 2 sprays into both nostrils daily.    Marland Kitchen levocetirizine (XYZAL) 5 MG tablet Take 5 mg by mouth daily.    . montelukast (SINGULAIR) 10 MG tablet Take 10 mg by mouth daily.    . Multiple Vitamin (MULTIVITAMIN WITH MINERALS) TABS tablet Take 1 tablet by mouth daily.    Marland Kitchen topiramate (TOPAMAX) 25 MG tablet Take 25 mg by mouth daily.    Marland Kitchen acetaZOLAMIDE (DIAMOX) 500 MG capsule Take 1 capsule (500 mg total) by mouth 2 (two) times daily. (Patient not taking: Reported on 07/15/2016) 60 capsule 11  . oxyCODONE (ROXICODONE) 5 MG/5ML solution Take 5-10 mLs (5-10 mg total) by mouth every 4 (four) hours as needed for moderate pain or severe pain. (Patient not taking: Reported on 07/15/2016)  0    Results for orders placed or performed during the hospital encounter of 07/15/16 (from the past 48 hour(s))  CBC WITH DIFFERENTIAL     Status: Abnormal   Collection Time: 07/15/16  4:28 PM  Result Value Ref Range   WBC 6.2 4.0 - 10.5 K/uL   RBC 4.18 3.87 - 5.11 MIL/uL   Hemoglobin 11.9 (L) 12.0 - 15.0 g/dL   HCT 36.0 36.0 - 46.0 %   MCV 86.1 78.0 - 100.0 fL   MCH 28.5 26.0 - 34.0 pg   MCHC 33.1 30.0 - 36.0 g/dL   RDW 13.7 11.5 - 15.5 %   Platelets 381 150 - 400 K/uL   Neutrophils Relative % 49 %   Neutro Abs 3.1 1.7 - 7.7 K/uL   Lymphocytes Relative 39 %   Lymphs Abs 2.4 0.7 - 4.0 K/uL   Monocytes Relative 6 %   Monocytes Absolute 0.4 0.1 - 1.0 K/uL   Eosinophils Relative 6 %   Eosinophils Absolute 0.4 0.0 - 0.7 K/uL   Basophils Relative 0 %   Basophils Absolute 0.0 0.0 - 0.1 K/uL  Comprehensive metabolic panel     Status: None   Collection Time: 07/15/16  4:28 PM  Result Value Ref Range   Sodium 139 135 - 145 mmol/L   Potassium 3.7 3.5 - 5.1 mmol/L   Chloride  109 101 - 111 mmol/L   CO2 23 22 - 32 mmol/L   Glucose, Bld 78 65 - 99 mg/dL   BUN 16 6 - 20 mg/dL   Creatinine, Ser 0.72 0.44 - 1.00 mg/dL   Calcium 9.2 8.9 - 10.3 mg/dL   Total Protein 6.7 6.5 -  8.1 g/dL   Albumin 3.7 3.5 - 5.0 g/dL   AST 20 15 - 41 U/L   ALT 19 14 - 54 U/L   Alkaline Phosphatase 53 38 - 126 U/L   Total Bilirubin 0.5 0.3 - 1.2 mg/dL   GFR calc non Af Amer >60 >60 mL/min   GFR calc Af Amer >60 >60 mL/min    Comment: (NOTE) The eGFR has been calculated using the CKD EPI equation. This calculation has not been validated in all clinical situations. eGFR's persistently <60 mL/min signify possible Chronic Kidney Disease.    Anion gap 7 5 - 15  Magnesium     Status: None   Collection Time: 07/15/16  4:28 PM  Result Value Ref Range   Magnesium 1.9 1.7 - 2.4 mg/dL  Phosphorus     Status: None   Collection Time: 07/15/16  4:28 PM  Result Value Ref Range   Phosphorus 3.5 2.5 - 4.6 mg/dL  Comprehensive metabolic panel     Status: Abnormal   Collection Time: 07/16/16  3:35 AM  Result Value Ref Range   Sodium 141 135 - 145 mmol/L   Potassium 3.4 (L) 3.5 - 5.1 mmol/L   Chloride 110 101 - 111 mmol/L   CO2 24 22 - 32 mmol/L   Glucose, Bld 70 65 - 99 mg/dL   BUN 14 6 - 20 mg/dL   Creatinine, Ser 8.00 0.44 - 1.00 mg/dL   Calcium 8.8 (L) 8.9 - 10.3 mg/dL   Total Protein 6.5 6.5 - 8.1 g/dL   Albumin 3.3 (L) 3.5 - 5.0 g/dL   AST 18 15 - 41 U/L   ALT 17 14 - 54 U/L   Alkaline Phosphatase 51 38 - 126 U/L   Total Bilirubin 0.7 0.3 - 1.2 mg/dL   GFR calc non Af Amer >60 >60 mL/min   GFR calc Af Amer >60 >60 mL/min    Comment: (NOTE) The eGFR has been calculated using the CKD EPI equation. This calculation has not been validated in all clinical situations. eGFR's persistently <60 mL/min signify possible Chronic Kidney Disease.    Anion gap 7 5 - 15   No results found.  Review of Systems  Constitutional: Negative for weight loss.  HENT: Negative for nosebleeds.    Eyes: Negative for blurred vision.  Respiratory: Negative for shortness of breath.   Cardiovascular: Negative for chest pain, palpitations, orthopnea and PND.       Denies DOE  Gastrointestinal: Positive for abdominal pain, heartburn, nausea and vomiting.  Genitourinary: Negative for dysuria and hematuria.  Musculoskeletal: Negative.   Skin: Negative for itching and rash.  Neurological: Negative for dizziness, focal weakness, seizures, loss of consciousness and headaches.       Denies TIAs, amaurosis fugax  Endo/Heme/Allergies: Does not bruise/bleed easily.  Psychiatric/Behavioral: The patient is not nervous/anxious.     Blood pressure 121/77, pulse 68, temperature 98.6 F (37 C), temperature source Oral, resp. rate 17, height 5\' 9"  (1.753 m), weight 113.8 kg (250 lb 14.1 oz), last menstrual period 11/11/2011, SpO2 100 %. Physical Exam  Vitals reviewed. Constitutional: She is oriented to person, place, and time. She appears well-developed and well-nourished. No distress.  HENT:  Head: Normocephalic and atraumatic.  Right Ear: External ear normal.  Left Ear: External ear normal.  Eyes: Conjunctivae are normal. No scleral icterus.  Neck: Normal range of motion. Neck supple. No tracheal deviation present. No thyromegaly present.  Cardiovascular: Normal rate and normal heart sounds.   Respiratory:  Effort normal and breath sounds normal. No stridor. No respiratory distress. She has no wheezes.  GI: Soft. She exhibits no distension. There is no tenderness. There is no rebound and no guarding.  Musculoskeletal: She exhibits no edema or tenderness.  Lymphadenopathy:    She has no cervical adenopathy.  Neurological: She is alert and oriented to person, place, and time. She exhibits normal muscle tone.  Skin: Skin is warm and dry. No rash noted. She is not diaphoretic. No erythema. No pallor.  Psychiatric: She has a normal mood and affect. Her behavior is normal. Judgment and thought content  normal.     Assessment/Plan S/p laparoscopic sleeve gastrectomy with hiatal hernia repair 06/23/2016 Severe GERD Epigastric pain Poor oral intake  Initiate ivf MVI, thiamine PPI bid Consider carafate Clears as tolerated Will picc/TPN Wednesday No sign of leak  I don't think she has a true stricture per se. The sleeve was constructed with a 29f ViSiGI bougie and the stapler did not hug the bougie tightly during the surgery. I believe this may be some reactive edema along with GERD.   I believe this will get better with time but timeframe unclear Therefore, I have recommended PICC line for supplemental ivf and TPN at home to avoid hospitalization while edema settles down  ELeighton Ruff WRedmond Pulling MD, FSt. [facility], [place]  WGayland Curry MD 07/16/2016, 11:25 PM

## 2016-07-16 NOTE — Progress Notes (Signed)
Checked in with patient per surgeon request.  Patient 23 days post Laparoscopic Gastric Sleeve with ongoing problems with oral intake.  Provided emotional support and encouragement as well as meeting times for both Lunch Bariatric Support Group (11:45-12:45 2nd Friday's in the PDR at [facility]) and Night Bariatric Support Group (6-7pm 3rd Thursday's in CR1 at [facility]). Patient expressed concerns about long term outlook, worst case scenario, specifically, what happens if her new stomach never works right.  Patient stated she was afraid to ask the questions because she was afraid of the answers.  I counseled patient to discuss with Dr. Redmond Pulling.  Patient appreciative of visit and seems liekly to reach out to support group in the near future.    Vilinda Flake RN Bariatric Coordinator  [facility]  [phone]

## 2016-07-17 LAB — DIFFERENTIAL
BASOS ABS: 0 10*3/uL (ref 0.0–0.1)
BASOS PCT: 0 %
EOS ABS: 0.4 10*3/uL (ref 0.0–0.7)
EOS PCT: 7 %
LYMPHS ABS: 1.5 10*3/uL (ref 0.7–4.0)
Lymphocytes Relative: 29 %
MONOS PCT: 11 %
Monocytes Absolute: 0.6 10*3/uL (ref 0.1–1.0)
Neutro Abs: 2.7 10*3/uL (ref 1.7–7.7)
Neutrophils Relative %: 53 %

## 2016-07-17 LAB — COMPREHENSIVE METABOLIC PANEL
ALK PHOS: 54 U/L (ref 38–126)
ALT: 18 U/L (ref 14–54)
ANION GAP: 4 — AB (ref 5–15)
AST: 18 U/L (ref 15–41)
Albumin: 3.4 g/dL — ABNORMAL LOW (ref 3.5–5.0)
BILIRUBIN TOTAL: 0.5 mg/dL (ref 0.3–1.2)
BUN: 8 mg/dL (ref 6–20)
CALCIUM: 8.6 mg/dL — AB (ref 8.9–10.3)
CO2: 24 mmol/L (ref 22–32)
Chloride: 110 mmol/L (ref 101–111)
Creatinine, Ser: 0.67 mg/dL (ref 0.44–1.00)
GFR calc Af Amer: >60 mL/min (ref >60)
GLUCOSE: 121 mg/dL — AB (ref 65–99)
POTASSIUM: 3.4 mmol/L — AB (ref 3.5–5.1)
Sodium: 138 mmol/L (ref 135–145)
TOTAL PROTEIN: 6.3 g/dL — AB (ref 6.5–8.1)

## 2016-07-17 LAB — GLUCOSE, CAPILLARY
Glucose-Capillary: 111 mg/dL — ABNORMAL HIGH (ref 65–99)
Glucose-Capillary: 131 mg/dL — ABNORMAL HIGH (ref 65–99)
Glucose-Capillary: 88 mg/dL (ref 65–99)

## 2016-07-17 LAB — CBC
HEMATOCRIT: 36.1 % (ref 36.0–46.0)
HEMOGLOBIN: 12 g/dL (ref 12.0–15.0)
MCH: 28.3 pg (ref 26.0–34.0)
MCHC: 33.2 g/dL (ref 30.0–36.0)
MCV: 85.1 fL (ref 78.0–100.0)
Platelets: 358 10*3/uL (ref 150–400)
RBC: 4.24 MIL/uL (ref 3.87–5.11)
RDW: 13.4 % (ref 11.5–15.5)
WBC: 5.2 10*3/uL (ref 4.0–10.5)

## 2016-07-17 LAB — PREALBUMIN: PREALBUMIN: 15 mg/dL — AB (ref 18–38)

## 2016-07-17 LAB — PHOSPHORUS: PHOSPHORUS: 3.5 mg/dL (ref 2.5–4.6)

## 2016-07-17 LAB — TRIGLYCERIDES: TRIGLYCERIDES: 77 mg/dL (ref <150)

## 2016-07-17 LAB — MAGNESIUM: Magnesium: 2 mg/dL (ref 1.7–2.4)

## 2016-07-17 MED ORDER — SUCRALFATE 1 GM/10ML PO SUSP: 1.0000 g | Freq: Three times a day (TID) | ORAL | 0 refills | Status: DC

## 2016-07-17 MED ORDER — ONDANSETRON 4 MG PO TBDP: 4.0000 mg | ORAL_TABLET | Freq: Four times a day (QID) | ORAL | 0 refills | Status: DC | PRN

## 2016-07-17 MED ORDER — HEPARIN SOD (PORK) LOCK FLUSH 100 UNIT/ML IV SOLN
250.0000 [IU] | INTRAVENOUS | Status: AC | PRN
  Administered 2016-07-17: 250 [IU]

## 2016-07-17 MED ORDER — TETANUS-DIPHTH-ACELL PERTUSSIS 5-2.5-18.5 LF-MCG/0.5 IM SUSP
0.5000 mL | Freq: Once | INTRAMUSCULAR | Status: AC
  Administered 2016-07-17: 0.5 mL via INTRAMUSCULAR
  Filled 2016-07-17 (×2): qty 0.5

## 2016-07-17 NOTE — Discharge Instructions (Signed)

## 2016-07-17 NOTE — Progress Notes (Addendum)
PHARMACY - ADULT TOTAL PARENTERAL NUTRITION CONSULT NOTE   Pharmacy Consult:  TPN Indication:  Intolerance to enteral feeding  Patient Measurements: Height: 5\' 9"  (175.3 cm) Weight: 250 lb 14.1 oz (113.8 kg) IBW/kg (Calculated) : 66.2 TPN AdjBW (KG): 78.1 Body mass index is 37.05 kg/m.  Assessment:  50 YOF s/p laparoscopic sleeve gastrectomy with hiatal hernia repair and upper endoscopy on 06/23/16 at [facility].  Re-admitted with intolerance to enteral feeding on 07/16/16 and Pharmacy consulted to manage TPN.  Noted she has poor PO intake for the past 3 weeks with a 14-lb weight loss within the past month and a 30-lb weight loss since surgery.  GI: hx GERD (dexilant PTA) - baseline prealbumin slightly low at 15.  Pepcid in TPN, sucralfate, Reglan.  Bile refluxing into mouth. Endo: prediabetes - CBGs controlled Insulin requirements in the past 24 hours: none yesterday Lytes: K+ 3.4, others WNL Renal: SCr 0.67, BUN WNL - good UOP 0.6 ml/kg/hr Pulm: stable on RA Cards: HTN, HLD - VSS Hepatobil: LFTs / tbili / TG WNL Neuro: pseudotumor cerebri / idiopathic intracranial hypertension ID: afebrile, WBC WNL - not on abx Best Practices: Lovenox TPN Access: PICC placed 07/16/16 TPN start date: 07/16/16  Nutritional Goals: 1600-1800 kCal and 90-110 gm protein  Current Nutrition:  TPN Premier protein QID (consumed 3 = 960 kCal and 180gm protein) Bariatric clear liquid diet   Plan:  - KCL supplementation per MD - Confirm with Dr. Redmond Pulling regarding patient's discharge.  Will not send TPN bag for today.   Elvia Aydin D. Mina Marble, PharmD, BCPS Pager:  [phone] 07/17/2016, 1:50 PM

## 2016-07-17 NOTE — Discharge Summary (Signed)
Physician Discharge Summary  [name] [id number] DOB: Sep 14, 1976 DOA: 07/15/2016  PCP: [name], MD  Admit date: 07/15/2016 Discharge date: 07/17/2016  Recommendations for Outpatient Follow-up:  1. Home health care for TPN/picc care  Follow-up Information    [name], MD. Schedule an appointment as soon as possible for a visit in 2 week(s).   Specialty:  General Surgery Contact information: [address] [place] [address] [phone]          Discharge Diagnoses:  1. Epigastric pain 2. GERD 3. Mild protein calorie malnutrition 4. History of laparoscopic sleeve gastrectomy with hiatal hernia repair September 18  Discharge Condition: Good Disposition: Home with home health nursing and TPN  Diet recommendation: Bariatric full status as tolerated  Filed Weights   07/15/16 1422  Weight: 113.8 kg (250 lb 14.1 oz)    Hospital Course:  She was brought in because of ongoing difficulty with oral intake and not meeting daily requirements due to GERD and epigastric pain and pain with oral intake. Her nutritional status has started to decrease. A PICC line was placed and she was started on IV fluids on admission along with a banana bag. PPI therapy was initiated along with anti-emetics as tolerated. Carafate was also initiated which seemed to help with the reflux. She was started on TPN. I believe her edema in her upper sleeve will get better with time however it is affecting her ability to eat and drink and not meet daily nutritional goals therefore she needs supplemental nutritional therapy. Since I believe this will be a temporary measure I recommended parenteral nutrition as opposed to enteral nutrition through a jejunostomy feeding tube. We will continue antibiotics, PPI therapy, and Carafate as an outpatient. The patient will continue to try oral intake as tolerated. She was visited by the bariatric nurse coordinator while she was here. We encouraged  outpatient follow-up with our support group. We also talked about the possibility of need for additional procedures should this not resolve in the near future   Discharge Instructions  Discharge Instructions    Ambulate hourly while awake    Complete by:  As directed    Call MD for:  difficulty breathing, headache or visual disturbances    Complete by:  As directed    Call MD for:  persistant dizziness or light-headedness    Complete by:  As directed    Call MD for:  persistant nausea and vomiting    Complete by:  As directed    Call MD for:  redness, tenderness, or signs of infection (pain, swelling, redness, odor or green/yellow discharge around incision site)    Complete by:  As directed    Call MD for:  severe uncontrolled pain    Complete by:  As directed    Call MD for:  temperature >101 F    Complete by:  As directed    Diet bariatric full liquid    Complete by:  As directed    Discharge instructions    Complete by:  As directed    See bariatric discharge instructions   Incentive spirometry    Complete by:  As directed    Perform hourly while awake       Medication List    STOP taking these medications   acetaZOLAMIDE 500 MG capsule Commonly known as:  DIAMOX     TAKE these medications   amLODipine 5 MG tablet Commonly known as:  NORVASC Take 1 tablet (5 mg  total) by mouth daily.   atorvastatin 40 MG tablet Commonly known as:  LIPITOR Take 1 tablet (40 mg total) by mouth at bedtime. What changed:  when to take this   azelastine 0.05 % ophthalmic solution Commonly known as:  OPTIVAR Place 1 drop into both eyes 3 (three) times daily as needed.   BIOTIN PO Take 1 tablet by mouth daily.   CALCIUM PO Take 1 tablet by mouth 3 (three) times daily.   cyanocobalamin 1000 MCG/ML injection Commonly known as:  (VITAMIN B-12) Inject 1,000 mcg into the muscle every 30 (thirty) days.   dexlansoprazole 60 MG capsule Commonly known as:  DEXILANT Take 1 capsule  (60 mg total) by mouth daily.   fluticasone 50 MCG/ACT nasal spray Commonly known as:  FLONASE Place 2 sprays into both nostrils daily.   levocetirizine 5 MG tablet Commonly known as:  XYZAL Take 5 mg by mouth daily.   montelukast 10 MG tablet Commonly known as:  SINGULAIR Take 10 mg by mouth daily.   multivitamin with minerals Tabs tablet Take 1 tablet by mouth daily.   ondansetron 4 MG disintegrating tablet Commonly known as:  ZOFRAN-ODT Take 1 tablet (4 mg total) by mouth every 6 (six) hours as needed for nausea.   oxyCODONE 5 MG/5ML solution Commonly known as:  ROXICODONE Take 5-10 mLs (5-10 mg total) by mouth every 4 (four) hours as needed for moderate pain or severe pain.   sucralfate 1 GM/10ML suspension Commonly known as:  CARAFATE Take 10 mLs (1 g total) by mouth 4 (four) times daily -  with meals and at bedtime.   topiramate 25 MG tablet Commonly known as:  TOPAMAX Take 25 mg by mouth daily.      Follow-up Information    [name], MD. Schedule an appointment as soon as possible for a visit in 2 week(s).   Specialty:  General Surgery Contact information: [address] [place] [address] [phone]            The results of significant diagnostics from this hospitalization (including imaging, microbiology, ancillary and laboratory) are listed below for reference.    Significant Diagnostic Studies: Dg Ugi  W/kub  Result Date: 07/11/2016 CLINICAL DATA:  Patient has nausea and reflux. Difficulty swallowing solid food. Patient had a gastric sleeve and hiatal hernia repair on 06/23/2016. EXAM: UPPER GI SERIES WITH KUB TECHNIQUE: After obtaining a scout radiograph a routine upper GI series was performed using thin barium FLUOROSCOPY TIME:  Fluoroscopy Time:  1 minutes and 18 seconds Radiation Exposure Index (if provided by the fluoroscopic device): 5980.08 micro Gy per meter squared Number of Acquired Spot Images: 11 COMPARISON:  04/04/2016  FINDINGS: Gastric anastomosis staples project in the epigastric region. There has been a previous cholecystectomy. KUB is otherwise unremarkable. Pharyngeal swallowing function is normal. The esophagus is normal in course and in caliber. There is no mass, stricture or evidence of inflammation. There is a small hiatal hernia. Esophageal motility is within normal limits. Significant gastroesophageal reflux was noted throughout the examination. There is an area of relative narrowing along the upper aspect of the gastric sleeve, with a diameter of approximately 6-7 mm. There is no mucosal ulceration or inflammation. There is a normal appearance to the distal stomach with a patent pylorus. Normal appearance of the duodenal bulb and C-sweep. IMPRESSION: 1. No esophageal stricture.  No esophageal mass or inflammation. 2. Significant gastroesophageal reflux noted during the study. 3. Area of relative narrowing of  the upper gastric sleeve. This is likely due to a fundoplication. 4. Small hiatal hernia. Electronically Signed   By: Lajean Manes M.D.   On: 07/11/2016 11:06    Microbiology: No results found for this or any previous visit (from the past 240 hour(s)).   Labs: Basic Metabolic Panel:  Recent Labs Lab 07/15/16 1628 07/16/16 0335 07/17/16 0849  NA 139 141 138  K 3.7 3.4* 3.4*  CL 109 110 110  CO2 23 24 24   GLUCOSE 78 70 121*  BUN 16 14 8   CREATININE 0.72 0.77 0.67  CALCIUM 9.2 8.8* 8.6*  MG 1.9  --  2.0  PHOS 3.5  --  3.5   Liver Function Tests:  Recent Labs Lab 07/15/16 1628 07/16/16 0335 07/17/16 0849  AST 20 18 18   ALT 19 17 18   ALKPHOS 53 51 54  BILITOT 0.5 0.7 0.5  PROT 6.7 6.5 6.3*  ALBUMIN 3.7 3.3* 3.4*   No results for input(s): LIPASE, AMYLASE in the last 168 hours. No results for input(s): AMMONIA in the last 168 hours. CBC:  Recent Labs Lab 07/15/16 1628 07/17/16 0849  WBC 6.2 5.2  NEUTROABS 3.1 2.7  HGB 11.9* 12.0  HCT 36.0 36.1  MCV 86.1 85.1  PLT 381  358   Cardiac Enzymes: No results for input(s): CKTOTAL, CKMB, CKMBINDEX, TROPONINI in the last 168 hours. BNP: BNP (last 3 results) No results for input(s): BNP in the last 8760 hours.  ProBNP (last 3 results) No results for input(s): PROBNP in the last 8760 hours.  CBG:  Recent Labs Lab 07/17/16 0019 07/17/16 0620 07/17/16 1152  GLUCAP 88 131* 111*    Active Problems:   Vomiting   Time coordinating discharge: 25 minutes  Signed:  [name], MD [facility], [place] [phone] 07/17/2016, 1:31 PM

## 2016-07-17 NOTE — Progress Notes (Signed)
Discharge paperwork given to patient. No questions verbalized. Patient is ready to discharge

## 2016-07-19 LAB — BASIC METABOLIC PANEL
Anion gap: 7 (ref 5–15)
BUN: 14 mg/dL (ref 6–20)
CALCIUM: 9.1 mg/dL (ref 8.9–10.3)
CO2: 25 mmol/L (ref 22–32)
Chloride: 105 mmol/L (ref 101–111)
Creatinine, Ser: 0.71 mg/dL (ref 0.44–1.00)
GLUCOSE: 86 mg/dL (ref 65–99)
Potassium: 3.5 mmol/L (ref 3.5–5.1)
SODIUM: 137 mmol/L (ref 135–145)

## 2016-07-19 LAB — PHOSPHORUS: Phosphorus: 4.1 mg/dL (ref 2.5–4.6)

## 2016-07-19 LAB — MAGNESIUM: MAGNESIUM: 2.2 mg/dL (ref 1.7–2.4)

## 2016-07-24 ENCOUNTER — Other Ambulatory Visit (HOSPITAL_COMMUNITY)
Admission: AD | Admit: 2016-07-24 | Discharge: 2016-07-24 | Disposition: A | Discharge status: Home / Self Care | Payer: BLUE CROSS/BLUE SHIELD | Source: Ambulatory Visit | Attending: General Surgery | Admitting: General Surgery

## 2016-07-24 DIAGNOSIS — Z5181 Encounter for therapeutic drug level monitoring: Secondary | ICD-10-CM | POA: Insufficient documentation

## 2016-07-24 LAB — CBC WITH DIFFERENTIAL/PLATELET
BASOS ABS: 0 10*3/uL (ref 0.0–0.1)
BASOS PCT: 1 %
EOS PCT: 6 %
Eosinophils Absolute: 0.4 10*3/uL (ref 0.0–0.7)
HCT: 36.3 % (ref 36.0–46.0)
Hemoglobin: 12 g/dL (ref 12.0–15.0)
LYMPHS PCT: 35 %
Lymphs Abs: 2.4 10*3/uL (ref 0.7–4.0)
MCH: 29 pg (ref 26.0–34.0)
MCHC: 33.1 g/dL (ref 30.0–36.0)
MCV: 87.7 fL (ref 78.0–100.0)
Monocytes Absolute: 0.7 10*3/uL (ref 0.1–1.0)
Monocytes Relative: 10 %
Neutro Abs: 3.2 10*3/uL (ref 1.7–7.7)
Neutrophils Relative %: 48 %
PLATELETS: 326 10*3/uL (ref 150–400)
RBC: 4.14 MIL/uL (ref 3.87–5.11)
RDW: 13.5 % (ref 11.5–15.5)
WBC: 6.6 10*3/uL (ref 4.0–10.5)

## 2016-07-24 LAB — COMPREHENSIVE METABOLIC PANEL
ALBUMIN: 3.9 g/dL (ref 3.5–5.0)
ALT: 61 U/L — AB (ref 14–54)
AST: 44 U/L — AB (ref 15–41)
Alkaline Phosphatase: 79 U/L (ref 38–126)
Anion gap: 7 (ref 5–15)
BUN: 18 mg/dL (ref 6–20)
CHLORIDE: 105 mmol/L (ref 101–111)
CO2: 25 mmol/L (ref 22–32)
CREATININE: 0.68 mg/dL (ref 0.44–1.00)
Calcium: 8.7 mg/dL — ABNORMAL LOW (ref 8.9–10.3)
GFR calc Af Amer: >60 mL/min (ref >60)
GLUCOSE: 73 mg/dL (ref 65–99)
POTASSIUM: 3.7 mmol/L (ref 3.5–5.1)
SODIUM: 137 mmol/L (ref 135–145)
Total Bilirubin: 0.3 mg/dL (ref 0.3–1.2)
Total Protein: 7.5 g/dL (ref 6.5–8.1)

## 2016-07-24 LAB — PHOSPHORUS: Phosphorus: 4.1 mg/dL (ref 2.5–4.6)

## 2016-07-24 LAB — MAGNESIUM: Magnesium: 2 mg/dL (ref 1.7–2.4)

## 2016-07-27 ENCOUNTER — Other Ambulatory Visit (HOSPITAL_COMMUNITY)
Admission: RE | Admit: 2016-07-27 | Discharge: 2016-07-27 | Disposition: A | Discharge status: Home / Self Care | Payer: BLUE CROSS/BLUE SHIELD | Source: Other Acute Inpatient Hospital | Attending: General Surgery | Admitting: General Surgery

## 2016-07-27 DIAGNOSIS — Z5181 Encounter for therapeutic drug level monitoring: Secondary | ICD-10-CM | POA: Insufficient documentation

## 2016-07-27 LAB — COMPREHENSIVE METABOLIC PANEL
ALT: 52 U/L (ref 14–54)
ANION GAP: 9 (ref 5–15)
AST: 34 U/L (ref 15–41)
Albumin: 3.8 g/dL (ref 3.5–5.0)
Alkaline Phosphatase: 74 U/L (ref 38–126)
BUN: 15 mg/dL (ref 6–20)
CHLORIDE: 107 mmol/L (ref 101–111)
CO2: 24 mmol/L (ref 22–32)
CREATININE: 0.76 mg/dL (ref 0.44–1.00)
Calcium: 8.9 mg/dL (ref 8.9–10.3)
Glucose, Bld: 72 mg/dL (ref 65–99)
Potassium: 3.5 mmol/L (ref 3.5–5.1)
SODIUM: 140 mmol/L (ref 135–145)
Total Bilirubin: 0.4 mg/dL (ref 0.3–1.2)
Total Protein: 7.3 g/dL (ref 6.5–8.1)

## 2016-07-27 LAB — CBC WITH DIFFERENTIAL/PLATELET
BASOS PCT: 1 %
Basophils Absolute: 0.1 10*3/uL (ref 0.0–0.1)
EOS ABS: 0.2 10*3/uL (ref 0.0–0.7)
EOS PCT: 4 %
HCT: 37.9 % (ref 36.0–46.0)
HEMOGLOBIN: 12.3 g/dL (ref 12.0–15.0)
LYMPHS PCT: 45 %
Lymphs Abs: 2.3 10*3/uL (ref 0.7–4.0)
MCH: 29.1 pg (ref 26.0–34.0)
MCHC: 32.5 g/dL (ref 30.0–36.0)
MCV: 89.8 fL (ref 78.0–100.0)
MONO ABS: 0.5 10*3/uL (ref 0.1–1.0)
Monocytes Relative: 9 %
NEUTROS PCT: 41 %
Neutro Abs: 2.2 10*3/uL (ref 1.7–7.7)
PLATELETS: ADEQUATE 10*3/uL (ref 150–400)
RBC: 4.22 MIL/uL (ref 3.87–5.11)
RDW: 13.9 % (ref 11.5–15.5)
WBC: 5.3 10*3/uL (ref 4.0–10.5)

## 2016-07-27 LAB — PREALBUMIN: Prealbumin: 21.3 mg/dL (ref 18–38)

## 2016-07-27 LAB — LIPID PANEL
CHOL/HDL RATIO: 4.6 ratio
CHOLESTEROL: 133 mg/dL (ref 0–200)
HDL: 29 mg/dL — AB (ref >40)
LDL Cholesterol: 87 mg/dL (ref 0–99)
Triglycerides: 84 mg/dL (ref <150)
VLDL: 17 mg/dL (ref 0–40)

## 2016-07-27 LAB — MAGNESIUM: Magnesium: 2.1 mg/dL (ref 1.7–2.4)

## 2016-07-27 LAB — PHOSPHORUS: Phosphorus: 4.5 mg/dL (ref 2.5–4.6)

## 2016-08-04 ENCOUNTER — Telehealth: Payer: Self-pay | Admitting: Dietician

## 2016-08-04 NOTE — Telephone Encounter (Signed)
Talked to [place] today. She is no longer on TPN. Recommended that she just drinks protein shakes without fruit or supplements. Encouraged her to drink more water throughout the day since she works out 6 days a week and sweats a lot.

## 2016-08-18 ENCOUNTER — Encounter (HOSPITAL_COMMUNITY): Payer: Self-pay | Admitting: *Deleted

## 2016-08-18 NOTE — Progress Notes (Signed)
Called and left message with [facility] on Nurse Triage line stating patient has not yet received paperwork from [facility] nor preop prep instructions.  Left call back number of [phone].

## 2016-08-19 ENCOUNTER — Ambulatory Visit: Payer: Self-pay | Admitting: General Surgery

## 2016-08-19 ENCOUNTER — Encounter: Payer: BLUE CROSS/BLUE SHIELD | Attending: General Surgery | Admitting: Dietician

## 2016-08-19 DIAGNOSIS — Z6835 Body mass index (BMI) 35.0-35.9, adult: Secondary | ICD-10-CM | POA: Insufficient documentation

## 2016-08-19 DIAGNOSIS — I10 Essential (primary) hypertension: Secondary | ICD-10-CM | POA: Diagnosis not present

## 2016-08-19 DIAGNOSIS — E669 Obesity, unspecified: Secondary | ICD-10-CM | POA: Insufficient documentation

## 2016-08-19 DIAGNOSIS — Z713 Dietary counseling and surveillance: Secondary | ICD-10-CM | POA: Diagnosis not present

## 2016-08-19 NOTE — Patient Instructions (Addendum)
Goals:  Follow Phase 3B: High Protein + Non-Starchy Vegetables  Eat 3-6 small meals/snacks, every 3-5 hrs  Increase lean protein foods to meet 60g goal  Increase fluid intake to 64oz +  Avoid drinking 15 minutes before, during and 30 minutes after eating  Aim for >30 min of physical activity daily  Try to eat 3 times per day or according to hunger  Try getting a food scale (2-3 oz is good)  If you are not able to eat enough, have protein shake  Have the iso-100 in place of a meal  Try greek yogurt in place of mayo  Surgery date: 06/23/2016 Surgery type: sleeve gastrectomy [facility] weight at [facility]: 261 lbs on 05/14/2016 Weight today: 238.8 lbs  Weight change: 12 lbs Total weight loss: 22.2 lbs  TANITA  BODY COMP RESULTS  06/10/16 07/09/16 08/19/16   BMI (kg/m^2) 38.9 49.5 35.3   Fat Mass (lbs) 136 124.2 115.2   Fat Free Mass (lbs) 127.6 126.6 123.6   Total Body Water (lbs) 94.4 93.2 90.6

## 2016-08-19 NOTE — Progress Notes (Signed)
  Follow-up visit:  8 Weeks Post-Operative Sleeve Gastrectomy Surgery  Medical Nutrition Therapy:  Appt start time: [id number] end time:  1120.  Primary concerns today: Post-operative Bariatric Surgery Nutrition Management. Returns with a 12 lbs weight loss. Has an upper GI scheduled for Monday but it reluctant to have the procedure. Works out 6 days per week and frustrated that the weight is not coming off faster.   Able to tolerate foods better than before.   Having apple cider vinegar in Iso tea.   Surgery date: 06/23/2016 Surgery type: sleeve gastrectomy Start weight at [facility]: 261 lbs on 05/14/2016 Weight today: 238.8 lbs  Weight change: 12 lbs Total weight loss: 22.2 lbs  TANITA  BODY COMP RESULTS  06/10/16 07/09/16 08/19/16   BMI (kg/m^2) 38.9 49.5 35.3   Fat Mass (lbs) 136 124.2 115.2   Fat Free Mass (lbs) 127.6 126.6 123.6   Total Body Water (lbs) 94.4 93.2 90.6    Preferred Learning Style:   No preference indicated   Learning Readiness:   Ready  24-hr recall: B (AM): 1 egg and 1 slice [place] bacon  (7 g) Snk (AM): none or other egg (0-7 g)  L (PM): 2 chicken tenders in skinny girl (14 g) Snk (PM): 2 chicken tenders in skinny girl (14 g) or low carb crackers with cheese  D (PM): 2 oz [place] burger with cheese and romaine lettuce(14 g) Snk (PM): 2 oz [place] burger with cheese (14 g)  Iso-100 in almond milk (50 g total per day before and after work out)  Fluid intake: 12 oz protein shake, over 64 oz water Estimated total protein intake: 120 g lately  Medications: see list  Supplementation: taking  Using straws: No Drinking while eating: one time by mistake Hair loss: No Carbonated beverages: No N/V/D/C: has been doing better with reflux this week   Dumping syndrome: No  Recent physical activity:  Works out 6 days per week   Progress Towards Goal(s):  In progress.  Handouts given during visit include:  Phase 3B High Protein + Non Starchy  Vegetables   Nutritional Diagnosis:  [place]-3.3 Overweight/obesity related to past poor dietary habits and physical inactivity as evidenced by patient w/ recent sleeve gastrectomy surgery following dietary guidelines for continued weight loss.    Intervention:  Nutrition education/diet advancement. Goals:  Follow Phase 3B: High Protein + Non-Starchy Vegetables  Eat 3-6 small meals/snacks, every 3-5 hrs  Increase lean protein foods to meet 60g goal  Increase fluid intake to 64oz +  Avoid drinking 15 minutes before, during and 30 minutes after eating  Aim for >30 min of physical activity daily  Try to eat 3 times per day or according to hunger  Try getting a food scale (2-3 oz is good)  If you are not able to eat enough, have protein shake  Have the iso-100 in place of a meal  Try greek yogurt in place of mayo   Teaching Method Utilized:  Visual Auditory Hands on  Barriers to learning/adherence to lifestyle change: none recently  Demonstrated degree of understanding via:  Teach Back   Monitoring/Evaluation:  Dietary intake, exercise, and body weight. Follow up in 1 months for 3 month post-op visit.

## 2016-08-25 ENCOUNTER — Ambulatory Visit (HOSPITAL_COMMUNITY)
Admission: RE | Admit: 2016-08-25 | Payer: BLUE CROSS/BLUE SHIELD | Source: Ambulatory Visit | Admitting: General Surgery

## 2016-08-25 HISTORY — DX: Other specified postprocedural states: Z98.890

## 2016-08-25 HISTORY — DX: Bronchitis, not specified as acute or chronic: J40

## 2016-08-25 HISTORY — DX: Nausea with vomiting, unspecified: R11.2

## 2016-08-25 SURGERY — ESOPHAGOGASTRODUODENOSCOPY (EGD) WITH PROPOFOL
Anesthesia: Monitor Anesthesia Care

## 2016-08-25 MED ORDER — PROPOFOL 10 MG/ML IV BOLUS
INTRAVENOUS | Status: AC
  Filled 2016-08-25: qty 40

## 2016-08-25 MED ORDER — MIDAZOLAM HCL 2 MG/2ML IJ SOLN
INTRAMUSCULAR | Status: AC
  Filled 2016-08-25: qty 2

## 2016-09-02 ENCOUNTER — Encounter (HOSPITAL_COMMUNITY): Payer: Self-pay

## 2016-09-18 ENCOUNTER — Encounter: Payer: BLUE CROSS/BLUE SHIELD | Attending: General Surgery | Admitting: Dietician

## 2016-09-18 DIAGNOSIS — Z6835 Body mass index (BMI) 35.0-35.9, adult: Secondary | ICD-10-CM | POA: Insufficient documentation

## 2016-09-18 DIAGNOSIS — E669 Obesity, unspecified: Secondary | ICD-10-CM | POA: Diagnosis not present

## 2016-09-18 DIAGNOSIS — Z713 Dietary counseling and surveillance: Secondary | ICD-10-CM | POA: Insufficient documentation

## 2016-09-18 DIAGNOSIS — I10 Essential (primary) hypertension: Secondary | ICD-10-CM | POA: Diagnosis not present

## 2016-09-18 NOTE — Progress Notes (Signed)
  Follow-up visit:  3 Months Post-Operative Sleeve Gastrectomy Surgery  Medical Nutrition Therapy:  Appt start time: 200 end time:  240  Primary concerns today: Post-operative Bariatric Surgery Nutrition Management. Returns with a 16.4 lbs weight loss. Not having any issues tolerating foods. Started going back to work at [employer] in [employer].  Surgery date: 06/23/2016 Surgery type: sleeve gastrectomy Start weight at [facility]: 261 lbs on 05/14/2016 Weight today: 222.4 lbs  Weight change: 16.4 lbs Total weight loss: 38.6 lbs  TANITA  BODY COMP RESULTS  06/10/16 07/09/16 08/19/16 09/18/16   BMI (kg/m^2) 38.9 49.5 35.3 31.0   Fat Mass (lbs) 136 124.2 115.2 99.4   Fat Free Mass (lbs) 127.6 126.6 123.6 123.0   Total Body Water (lbs) 94.4 93.2 90.6 89.6    Preferred Learning Style:   No preference indicated   Learning Readiness:   Ready  24-hr recall: B (AM): 2 boiled eggs with pickled  (12 g) Snk (AM): none or pumpkin seed ? bar or light and fit greek yogurt (0-12g) L (PM): 5 mussels or salad with chicken (eats on it for a while) (5-21 g) Snk (PM): 5 x week Iso-100 protein shake (25 g) D (PM): none or can of sardines or 3 oz chicken (0-21 g) Snk (PM): none   Fluid intake: 12 oz protein shake, over 64 oz water (hard to get fluid in on days off) Estimated total protein intake: 60-75 g lately  Medications: see list  Supplementation: taking  Using straws: No Drinking while eating: No Hair loss: Cut hair - can't tell  Carbonated beverages: No N/V/D/C: No and reflux is better  Dumping syndrome: once with a smoothie  Recent physical activity:  Works out 5 days per week mix of cardio and strength  Progress Towards Goal(s):  In progress.  Handouts given during visit include:  none   Nutritional Diagnosis:  [place]-3.3 Overweight/obesity related to past poor dietary habits and physical inactivity as evidenced by patient w/ recent sleeve gastrectomy surgery following dietary  guidelines for continued weight loss.    Intervention:  Nutrition education/diet advancement. Goals:  Follow Phase 3B: High Protein + Non-Starchy Vegetables  Eat 3-6 small meals/snacks, every 3-5 hrs  Increase lean protein foods to meet 60g goal  Increase fluid intake to 64oz +  Avoid drinking 15 minutes before, during and 30 minutes after eating  Aim for >30 min of physical activity daily  Try to eat 3 times per day or according to hunger  Try greek yogurt in place of mayo   Teaching Method Utilized:  Visual Auditory Hands on  Barriers to learning/adherence to lifestyle change: none recently  Demonstrated degree of understanding via:  Teach Back   Monitoring/Evaluation:  Dietary intake, exercise, and body weight. Follow up in 2 months for 5 month post-op visit.

## 2016-09-18 NOTE — Patient Instructions (Addendum)
Goals:  Follow Phase 3B: High Protein + Non-Starchy Vegetables  Eat 3-6 small meals/snacks, every 3-5 hrs  Increase lean protein foods to meet 60g goal  Increase fluid intake to 64oz +  Avoid drinking 15 minutes before, during and 30 minutes after eating  Aim for >30 min of physical activity daily  Try to eat 3 times per day or according to hunger  Try greek yogurt in place of mayo   Surgery date: 06/23/2016 Surgery type: sleeve gastrectomy Start weight at [facility]: 261 lbs on 05/14/2016 Weight today: 222.4 lbs  Weight change: 16.4 lbs Total weight loss: 38.6 lbs  TANITA  BODY COMP RESULTS  06/10/16 07/09/16 08/19/16 09/18/16   BMI (kg/m^2) 38.9 49.5 35.3 31.0   Fat Mass (lbs) 136 124.2 115.2 99.4   Fat Free Mass (lbs) 127.6 126.6 123.6 123.0   Total Body Water (lbs) 94.4 93.2 90.6 89.6

## 2016-11-19 ENCOUNTER — Ambulatory Visit: Payer: BLUE CROSS/BLUE SHIELD | Admitting: Skilled Nursing Facility1

## 2016-11-20 ENCOUNTER — Encounter: Payer: BLUE CROSS/BLUE SHIELD | Attending: General Surgery | Admitting: Skilled Nursing Facility1

## 2016-11-20 ENCOUNTER — Encounter: Payer: Self-pay | Admitting: Skilled Nursing Facility1

## 2016-11-20 DIAGNOSIS — Z713 Dietary counseling and surveillance: Secondary | ICD-10-CM | POA: Diagnosis present

## 2016-11-20 DIAGNOSIS — E669 Obesity, unspecified: Secondary | ICD-10-CM | POA: Insufficient documentation

## 2016-11-20 DIAGNOSIS — Z6835 Body mass index (BMI) 35.0-35.9, adult: Secondary | ICD-10-CM | POA: Insufficient documentation

## 2016-11-20 DIAGNOSIS — I10 Essential (primary) hypertension: Secondary | ICD-10-CM | POA: Insufficient documentation

## 2016-11-20 NOTE — Progress Notes (Signed)
  Follow-up visit:  3 Months Post-Operative Sleeve Gastrectomy Surgery  Medical Nutrition Therapy:  Appt start time: 200 end time:  240  Primary concerns today: Post-operative Bariatric Surgery Nutrition Management.  Pt states she is depressed about her lack of tight skin on her stomach and breasts. Pt states she loves her fruit. Pt states cinnobon made her throw up/diarrhea but maxy B's cupcake was fine. Pt states she works out hard 5 days a week and likes to look sexy.  Surgery date: 06/23/2016 Surgery type: sleeve gastrectomy Start weight at [facility]: 261 lbs on 05/14/2016 Weight today: 194 lbs  Weight change: 28.4 lbs  TANITA  BODY COMP RESULTS  06/10/16 07/09/16 08/19/16 09/18/16 11/20/2016   BMI (kg/m^2) 38.9 49.5 35.3 31.0 28.6   Fat Mass (lbs) 136 124.2 115.2 99.4 78.6   Fat Free Mass (lbs) 127.6 126.6 123.6 123.0 115.4   Total Body Water (lbs) 94.4 93.2 90.6 89.6 83    Preferred Learning Style:   No preference indicated   Learning Readiness:   Ready  24-hr recall: B (AM): 2 boiled eggs with pickled  (12 g) Snk (AM): none or pumpkin seed ? bar or light and fit greek yogurt (0-12g) L (PM): 5 mussels or salad with chicken (eats on it for a while) (5-21 g) Snk (PM): 5 x week Iso-100 protein shake (25 g) D (PM): none or can of sardines or 3 oz chicken (0-21 g) Snk (PM): none   Fluid intake: 12 oz protein shake, over 64 oz water (hard to get fluid in on days off) Estimated total protein intake: 60-75 g lately  Medications: see list  Supplementation: taking  Using straws: No Drinking while eating: No Hair loss: Cut hair - can't tell  Carbonated beverages: No N/V/D/C: No and reflux is better  Dumping syndrome: once with a smoothie  Recent physical activity:  Works out 5 days per week mix of cardio and strength  Progress Towards Goal(s):  In progress.  Handouts given during visit include:  none   Nutritional Diagnosis:  [place]-3.3 Overweight/obesity related to past poor  dietary habits and physical inactivity as evidenced by patient w/ recent sleeve gastrectomy surgery following dietary guidelines for continued weight loss.    Intervention:  Nutrition education/diet advancement. Goals:  Follow Phase 3B: High Protein + Non-Starchy Vegetables  Eat 3-6 small meals/snacks, every 3-5 hrs  Increase lean protein foods to meet 60g goal  Increase fluid intake to 64oz +  Avoid drinking 15 minutes before, during and 30 minutes after eating  Aim for >30 min of physical activity daily  Try to eat 3 times per day or according to hunger  Try greek yogurt in place of mayo   Teaching Method Utilized:  Visual Auditory Hands on  Barriers to learning/adherence to lifestyle change: none recently  Demonstrated degree of understanding via:  Teach Back   Monitoring/Evaluation:  Dietary intake, exercise, and body weight. Follow up in 2 months for 5 month post-op visit.

## 2016-12-12 ENCOUNTER — Emergency Department (HOSPITAL_BASED_OUTPATIENT_CLINIC_OR_DEPARTMENT_OTHER): Payer: BLUE CROSS/BLUE SHIELD

## 2016-12-12 ENCOUNTER — Encounter (HOSPITAL_BASED_OUTPATIENT_CLINIC_OR_DEPARTMENT_OTHER): Payer: Self-pay | Admitting: *Deleted

## 2016-12-12 ENCOUNTER — Emergency Department (HOSPITAL_BASED_OUTPATIENT_CLINIC_OR_DEPARTMENT_OTHER)
Admission: EM | Admit: 2016-12-12 | Discharge: 2016-12-12 | Disposition: A | Discharge status: Home / Self Care | Payer: BLUE CROSS/BLUE SHIELD | Attending: Emergency Medicine | Admitting: Emergency Medicine

## 2016-12-12 DIAGNOSIS — Z79899 Other long term (current) drug therapy: Secondary | ICD-10-CM | POA: Diagnosis not present

## 2016-12-12 DIAGNOSIS — M79604 Pain in right leg: Secondary | ICD-10-CM | POA: Insufficient documentation

## 2016-12-12 DIAGNOSIS — I10 Essential (primary) hypertension: Secondary | ICD-10-CM | POA: Diagnosis not present

## 2016-12-12 DIAGNOSIS — J45909 Unspecified asthma, uncomplicated: Secondary | ICD-10-CM | POA: Insufficient documentation

## 2016-12-12 DIAGNOSIS — R55 Syncope and collapse: Secondary | ICD-10-CM | POA: Insufficient documentation

## 2016-12-12 DIAGNOSIS — F1290 Cannabis use, unspecified, uncomplicated: Secondary | ICD-10-CM | POA: Diagnosis not present

## 2016-12-12 LAB — CBC
HCT: 36.4 % (ref 36.0–46.0)
Hemoglobin: 12.5 g/dL (ref 12.0–15.0)
MCH: 29.6 pg (ref 26.0–34.0)
MCHC: 34.3 g/dL (ref 30.0–36.0)
MCV: 86.1 fL (ref 78.0–100.0)
PLATELETS: 300 10*3/uL (ref 150–400)
RBC: 4.23 MIL/uL (ref 3.87–5.11)
RDW: 14.1 % (ref 11.5–15.5)
WBC: 5.1 10*3/uL (ref 4.0–10.5)

## 2016-12-12 LAB — URINALYSIS, ROUTINE W REFLEX MICROSCOPIC
BILIRUBIN URINE: NEGATIVE
GLUCOSE, UA: NEGATIVE mg/dL
HGB URINE DIPSTICK: NEGATIVE
Ketones, ur: NEGATIVE mg/dL
Leukocytes, UA: NEGATIVE
Nitrite: NEGATIVE
PH: 6 (ref 5.0–8.0)
Protein, ur: NEGATIVE mg/dL
Specific Gravity, Urine: 1.026 (ref 1.005–1.030)

## 2016-12-12 LAB — CBG MONITORING, ED: Glucose-Capillary: 89 mg/dL (ref 65–99)

## 2016-12-12 LAB — BASIC METABOLIC PANEL
Anion gap: 6 (ref 5–15)
BUN: 17 mg/dL (ref 6–20)
CO2: 26 mmol/L (ref 22–32)
Calcium: 9.1 mg/dL (ref 8.9–10.3)
Chloride: 105 mmol/L (ref 101–111)
Creatinine, Ser: 0.77 mg/dL (ref 0.44–1.00)
GFR calc Af Amer: >60 mL/min (ref >60)
GLUCOSE: 77 mg/dL (ref 65–99)
POTASSIUM: 3.6 mmol/L (ref 3.5–5.1)
Sodium: 137 mmol/L (ref 135–145)

## 2016-12-12 LAB — PREGNANCY, URINE: Preg Test, Ur: NEGATIVE

## 2016-12-12 MED ORDER — SODIUM CHLORIDE 0.45 % IV BOLUS
1000.0000 mL | Freq: Once | INTRAVENOUS | Status: AC
  Administered 2016-12-12: 1000 mL via INTRAVENOUS

## 2016-12-12 NOTE — ED Provider Notes (Signed)
[place] DEPT [facility] Provider Note   CSN: [id number] Arrival date & time: 12/12/16  1045     History   Chief Complaint Chief Complaint  Patient presents with  . Dizziness  . Leg Pain    HPI [name] is a 41 y.o. female.  HPI Pt woke up with severe cramping pain in her right leg.  She was sitting on the side of the bed trying to massage it.   She had to urinate during this episode and she had to walk to the bathroom.   She felt lightheaded as if she would faint.  Pt had a syncopal episode while going to the bathroom.  She remembers falling to the floor. She did hit her head and thinks she injured her foot. SHe was able to get up and go to work this am.  She is not sure why she passed out.  She still has a sensation of feeling lightheaded intermittently this am.  No headache.  No chest pain or shortness of breath.  No blood in her stool.  She has no history of similar sx.  She has a history of seizure as a child but nothing in many years.   Past Medical History:  Diagnosis Date  . Anemia   . Anginal pain ([place])    Pt has chest pains but pt states she was informed is related to acid reflux  . Asthma    pt denies  . Bronchitis   . Chronic abdominal pain   . Chronic bronchitis ([place])   . Chronic constipation   . Chronic lower back pain   . GERD (gastroesophageal reflux disease)   . High cholesterol   . 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 History of hiatal hernia   . History of urinary tract infection   . Hypertension   . Migraine    "3-4 times/month" (07/15/2016)  . Pneumonia ~ 2000 X 1  . PONV (postoperative nausea and vomiting)   . Pre-diabetes   . Pseudotumor cerebri dx'd ealy 2000s  . Seizures ([place])    in childhood / teenage years   . Vision disturbance    "related to [facility]"    Patient Active Problem List   Diagnosis Date Noted  . Vomiting 07/15/2016  . Dehydration 07/10/2016  . Prediabetes 06/23/2016  . Hiatal hernia  06/23/2016  . Hypercholesterolemia with hypertriglyceridemia 06/23/2016  . 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 IIH (idiopathic intracranial hypertension) 03/03/2016  . Second degree hemorrhoids   . Headache 01/05/2016  . Chest pain 01/01/2016  . Pseudotumor cerebri 01/01/2016  . HTN (hypertension) 01/01/2016  . Obesity (BMI 30-39.9) 01/01/2016  . Asthma 01/01/2016  . GERD (gastroesophageal reflux disease) 01/01/2016  . Chest pain syndrome 01/01/2016  . Rectal bleeding 12/26/2015  . Chest discomfort 12/26/2015  . Frequent headaches 12/26/2015  . Pain in joint, lower leg 04/11/2014  . Stiffness of joint, not elsewhere classified, pelvic region and thigh 04/11/2014  . Difficulty in walking(719.7) 04/11/2014  . 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 Rhabdomyolysis 06/27/2013  . Hypokalemia 06/27/2013  . Bruising 06/27/2013  . ACHILLES TENDON RUPTURE 05/29/2008    Past Surgical History:  Procedure Laterality Date  . ABDOMINAL HYSTERECTOMY  "early 2000s"   "partial"  . ACHILLES TENDON LENGTHENING Left   . COLONOSCOPY  2008   Dr. Gala Romney: friable anal canal, otherwise normal  . COLONOSCOPY N/A 01/23/2016   DGL:OVFIEPPI hemorrhoids  . DILATION  AND CURETTAGE OF UTERUS    . ESOPHAGOGASTRODUODENOSCOPY  2009   Dr. Gala Romney: normal   . ESOPHAGOGASTRODUODENOSCOPY N/A 01/23/2016   MWN:UUVOZD esophagus/small HH  . HERNIA REPAIR    . HIATAL HERNIA REPAIR  06/23/2016   Procedure: LAPAROSCOPIC REPAIR OF HIATAL HERNIA;  Surgeon: Greer Pickerel, MD;  Location: [facility] ORS;  Service: General;;  . LAPAROSCOPIC CHOLECYSTECTOMY    . LAPAROSCOPIC GASTRIC SLEEVE RESECTION N/A 06/23/2016   Procedure: LAPAROSCOPIC GASTRIC SLEEVE RESECTION WITH UPPER ENDO;  Surgeon: Greer Pickerel, MD;  Location: [facility] ORS;  Service: General;  Laterality: N/A;  . LUMBAR PUNCTURE  "several since early 20's"  . vaginal trauma     secondary to fall in childhood / did have to have surgery pt not sure of details    OB  History    Gravida Para Term Preterm AB Living   _0 SAB TAB Ectopic Multiple Live Births                   Home Medications    Prior to Admission medications   Medication Sig Start Date End Date Taking? Authorizing Provider  amLODipine (NORVASC) 5 MG tablet Take 1 tablet (5 mg total) by mouth daily. Patient taking differently: Take 5 mg by mouth. Patient reports not taking since 08/04/2016 01/03/16   Ripudeep Krystal Eaton, MD  atorvastatin (LIPITOR) 40 MG tablet Take 1 tablet (40 mg total) by mouth at bedtime. Patient not taking: Reported on 08/19/2016 01/03/16   Ripudeep Krystal Eaton, MD  azelastine (OPTIVAR) 0.05 % ophthalmic solution Place 1 drop into both eyes 3 (three) times daily as needed (ALLERGIES).  01/17/16   Historical Provider, MD  BIOTIN PO Take 1 tablet by mouth daily.    Historical Provider, MD  CALCIUM PO Take 1 tablet by mouth 3 (three) times daily.    Historical Provider, MD  cyanocobalamin (,VITAMIN B-12,) 1000 MCG/ML injection Inject 1,000 mcg into the muscle every 30 (thirty) days. DUE 08-2016    Historical Provider, MD  dexlansoprazole (DEXILANT) 60 MG capsule Take 1 capsule (60 mg total) by mouth daily. 05/22/16   Annitta Needs, NP  fluticasone [facility]) 50 MCG/ACT nasal spray Place 2 sprays into both nostrils daily.    Historical Provider, MD  levocetirizine (XYZAL) 5 MG tablet Take 5 mg by mouth daily.    Historical Provider, MD  montelukast (SINGULAIR) 10 MG tablet Take 10 mg by mouth daily.    Historical Provider, MD  Multiple Vitamin (MULTIVITAMIN WITH MINERALS) TABS tablet Take 1 tablet by mouth daily.    Historical Provider, MD  ondansetron (ZOFRAN-ODT) 4 MG disintegrating tablet Take 1 tablet (4 mg total) by mouth every 6 (six) hours as needed for nausea. Patient not taking: Reported on 08/19/2016 07/17/16   Greer Pickerel, MD  oxyCODONE (ROXICODONE) 5 MG/5ML solution Take 5-10 mLs (5-10 mg total) by mouth every 4 (four) hours as needed for moderate pain or severe  pain. Patient not taking: Reported on 07/15/2016 06/26/16   Greer Pickerel, MD  pantoprazole (PROTONIX) 40 MG tablet Take 40 mg by mouth 2 (two) times daily.    Historical Provider, MD  sucralfate (CARAFATE) 1 GM/10ML suspension Take 10 mLs (1 g total) by mouth 4 (four) times daily -  with meals and at bedtime. Patient not taking: Reported on 08/19/2016 07/17/16   Greer Pickerel, MD  topiramate (TOPAMAX) 25 MG tablet Take 25 mg by mouth daily. 12/31/15   Historical Provider,  MD    Family History Family History  Problem Relation Age of Onset  . Migraines Mother   . Cancer Maternal Uncle   . Cancer Maternal Uncle   . Cancer Paternal Grandmother   . Cancer Paternal Grandfather   . Colon cancer      unknown    Social History Social History  Substance Use Topics  . Smoking status: Never Smoker  . Smokeless tobacco: Never Used  . Alcohol use 0.0 oz/week     Comment: hx of wine - 05/2016      Allergies   Patient has no known allergies.   Review of Systems Review of Systems  All other systems reviewed and are negative.    Physical Exam Updated Vital Signs BP 122/80   Pulse (!) 59   Temp 98.4 F (36.9 C) (Oral)   Resp 19   Ht _0  (1.778 m)   Wt 83.9 kg   LMP 11/11/2011   SpO2 100%   BMI 26.54 kg/m   Physical Exam  Constitutional: She appears well-developed and well-nourished. No distress.  HENT:  Head: Normocephalic.  Right Ear: External ear normal.  Left Ear: External ear normal.  ttp posterior occiput  Eyes: Conjunctivae are normal. Right eye exhibits no discharge. Left eye exhibits no discharge. No scleral icterus.  Neck: Neck supple. No tracheal deviation present.  Cardiovascular: Normal rate, regular rhythm and intact distal pulses.   Pulmonary/Chest: Effort normal and breath sounds normal. No stridor. No respiratory distress. She has no wheezes. She has no rales.  Abdominal: Soft. Bowel sounds are normal. She exhibits no distension. There is no tenderness.  There is no rebound and no guarding.  Musculoskeletal: She exhibits no edema or tenderness.  Small <1cm cystic lesion over the dorsal aspect of the right mifoot, c/w a cyst  Neurological: She is alert. She has normal strength. No cranial nerve deficit (no facial droop, extraocular movements intact, no slurred speech) or sensory deficit. She exhibits normal muscle tone. She displays no seizure activity. Coordination normal.  Skin: Skin is warm and dry. No rash noted. She is not diaphoretic.  Psychiatric: She has a normal mood and affect.  Nursing note and vitals reviewed.    ED Treatments / Results  Labs (all labs ordered are listed, but only abnormal results are displayed) Labs Reviewed  URINALYSIS, ROUTINE W REFLEX MICROSCOPIC - Abnormal; Notable for the following:       Result Value   APPearance CLOUDY (*)    All other components within normal limits  PREGNANCY, URINE  CBC  BASIC METABOLIC PANEL  CBG MONITORING, ED    EKG  EKG Interpretation  Date/Time:  Friday December 12 2016 12:32:54 EST Ventricular Rate:  65 PR Interval:    QRS Duration: 95 QT Interval:  465 QTC Calculation: 484 R Axis:   -7 Text Interpretation:  Sinus rhythm Low voltage, precordial leads Anteroseptal infarct, age indeterminate No significant change since last tracing Confirmed by Kaiyan Luczak  MD-J, Sanja Elizardo [phone]) on 12/12/2016 12:53:13 PM       Radiology Ct Head Wo Contrast  Result Date: 12/12/2016 CLINICAL DATA:  Syncope. EXAM: CT HEAD WITHOUT CONTRAST TECHNIQUE: Contiguous axial images were obtained from the base of the skull through the vertex without intravenous contrast. COMPARISON:  CT scan of April 30, 2015. FINDINGS: Brain: No evidence of acute infarction, hemorrhage, hydrocephalus, extra-axial collection or mass lesion/mass effect. Vascular: No hyperdense vessel or unexpected calcification. Skull: Normal. Negative for fracture or focal lesion. Sinuses/Orbits: No acute  finding. Other: None. IMPRESSION: Normal  head CT. Electronically Signed   By: Marijo Conception, M.D.   On: 12/12/2016 12:15   Dg Foot Complete Right  Result Date: 12/12/2016 CLINICAL DATA:  Syncope, head injury, right foot injury EXAM: RIGHT FOOT COMPLETE - 3+ VIEW COMPARISON:  None. FINDINGS: There is no evidence of fracture or dislocation. There is a small plantar calcaneal spur. There is a bipartite medial hallux sesamoid. Soft tissues are unremarkable. IMPRESSION: No acute osseous injury of the right foot. Electronically Signed   By: Kathreen Devoid   On: 12/12/2016 12:17    Procedures Procedures (including critical care time)  Medications Ordered in ED Medications  sodium chloride 0.45 % bolus 1,000 mL (0 mLs Intravenous Stopped 12/12/16 1322)     Initial Impression / Assessment and Plan / ED Course  I have reviewed the triage vital signs and the nursing notes.  Pertinent labs & imaging results that were available during my care of the patient were reviewed by me and considered in my medical decision making (see chart for details).   I suspect the patient had a vasovagal episode last night. Her symptoms all started when she had severe leg cramping. Patient provided additional history and told me that she started a squat challenge recently. Her leg cramps were likely related to her increased physical activity.  Patient was having severe pain and felt lightheaded and then stood up.  No evidence to suggest DVT or PE. Her laboratory tests are reassuring. She's not dehydrated or anemic.  No significant injuries associated with her fall.   I recommended increased fluid intake. Follow-up with her primary care doctor. Final Clinical Impressions(s) / ED Diagnoses   Final diagnoses:  Vasovagal syncope    New Prescriptions New Prescriptions   No medications on file     Dorie Rank, MD 12/12/16 1414

## 2016-12-12 NOTE — Discharge Instructions (Signed)
Drink plenty of fluids, follow-up with your primary care doctor, return as needed for worsening symptoms.

## 2016-12-12 NOTE — ED Triage Notes (Signed)
Woke last night with cramp in the back of her left leg. She got light headed, nauseated and passed out while walking to the bathroom.  She got up at 2:30am and went to work. At work she bent over and got dizzy. Her supervisor told her to come to the ED for a check up. She drove herself here. She is very talkative and tearful at triage. States she is scared because she does not know why she passed out.

## 2016-12-30 ENCOUNTER — Ambulatory Visit: Payer: BLUE CROSS/BLUE SHIELD

## 2016-12-30 ENCOUNTER — Ambulatory Visit (INDEPENDENT_AMBULATORY_CARE_PROVIDER_SITE_OTHER): Payer: BLUE CROSS/BLUE SHIELD | Admitting: Sports Medicine

## 2016-12-30 DIAGNOSIS — M79671 Pain in right foot: Secondary | ICD-10-CM

## 2016-12-30 DIAGNOSIS — M6740 Ganglion, unspecified site: Secondary | ICD-10-CM | POA: Diagnosis not present

## 2016-12-30 NOTE — Progress Notes (Signed)
Subjective: [name] is a 41 y.o. female patient who presents to office for evaluation of right foot pain. Patient complains of progressive pain at bump on top of right foot and does not like how it looks; started after a trip and fall. Patient denies any other pedal complaints.   Patient Active Problem List   Diagnosis Date Noted  . Vomiting 07/15/2016  . Dehydration 07/10/2016  . Prediabetes 06/23/2016  . Hiatal hernia 06/23/2016  . Hypercholesterolemia with hypertriglyceridemia 06/23/2016  . 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 IIH (idiopathic intracranial hypertension) 03/03/2016  . Second degree hemorrhoids   . Headache 01/05/2016  . Chest pain 01/01/2016  . Pseudotumor cerebri 01/01/2016  . HTN (hypertension) 01/01/2016  . Obesity (BMI 30-39.9) 01/01/2016  . Asthma 01/01/2016  . GERD (gastroesophageal reflux disease) 01/01/2016  . Chest pain syndrome 01/01/2016  . Rectal bleeding 12/26/2015  . Chest discomfort 12/26/2015  . Frequent headaches 12/26/2015  . Pain in joint, lower leg 04/11/2014  . Stiffness of joint, not elsewhere classified, pelvic region and thigh 04/11/2014  . Difficulty in walking(719.7) 04/11/2014  . 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 Rhabdomyolysis 06/27/2013  . Hypokalemia 06/27/2013  . Bruising 06/27/2013  . ACHILLES TENDON RUPTURE 05/29/2008    Current Outpatient Prescriptions on File Prior to Visit  Medication Sig Dispense Refill  . amLODipine (NORVASC) 5 MG tablet Take 1 tablet (5 mg total) by mouth daily. (Patient taking differently: Take 5 mg by mouth. Patient reports not taking since 08/04/2016) 30 tablet 3  . atorvastatin (LIPITOR) 40 MG tablet Take 1 tablet (40 mg total) by mouth at bedtime. (Patient not taking: Reported on 08/19/2016) 30 tablet 3  . azelastine (OPTIVAR) 0.05 % ophthalmic solution Place 1 drop into both eyes 3 (three) times daily as needed (ALLERGIES).     Marland Kitchen BIOTIN PO Take 1 tablet by mouth daily.     Marland Kitchen CALCIUM PO Take 1 tablet by mouth 3 (three) times daily.    . cyanocobalamin (,VITAMIN B-12,) 1000 MCG/ML injection Inject 1,000 mcg into the muscle every 30 (thirty) days. DUE 08-2016    . dexlansoprazole (DEXILANT) 60 MG capsule Take 1 capsule (60 mg total) by mouth daily. 90 capsule 1  . fluticasone (FLONASE) 50 MCG/ACT nasal spray Place 2 sprays into both nostrils daily.    Marland Kitchen levocetirizine (XYZAL) 5 MG tablet Take 5 mg by mouth daily.    . montelukast (SINGULAIR) 10 MG tablet Take 10 mg by mouth daily.    . Multiple Vitamin (MULTIVITAMIN WITH MINERALS) TABS tablet Take 1 tablet by mouth daily.    . ondansetron (ZOFRAN-ODT) 4 MG disintegrating tablet Take 1 tablet (4 mg total) by mouth every 6 (six) hours as needed for nausea. (Patient not taking: Reported on 08/19/2016) 20 tablet 0  . oxyCODONE (ROXICODONE) 5 MG/5ML solution Take 5-10 mLs (5-10 mg total) by mouth every 4 (four) hours as needed for moderate pain or severe pain. (Patient not taking: Reported on 07/15/2016)  0  . pantoprazole (PROTONIX) 40 MG tablet Take 40 mg by mouth 2 (two) times daily.    . sucralfate (CARAFATE) 1 GM/10ML suspension Take 10 mLs (1 g total) by mouth 4 (four) times daily -  with meals and at bedtime. (Patient not taking: Reported on 08/19/2016) 200 mL 0  . topiramate (TOPAMAX) 25 MG tablet Take 25 mg by mouth daily.     No current facility-administered medications on file prior to visit.     No Known Allergies  Objective:  General: Alert and oriented x3 in no acute distress  Dermatology: Raised soft tissue lesion filled with fluid at dorsal right foot that is consistent with cyst. No open lesions bilateral lower extremities, no webspace macerations, no ecchymosis bilateral, all nails x 10 are well manicured with polish.  Vascular: Dorsalis Pedis and Posterior Tibial pedal pulses palpable, Capillary Fill Time 3 seconds,(+) pedal hair growth bilateral, no edema bilateral lower extremities, Temperature  gradient within normal limits.  Neurology: Johney Maine sensation intact via light touch bilateral. (- )Tinels sign bilateral.   Musculoskeletal: Mild tenderness with palpation at cyst on right foot dorsal surface,No pain with calf compression bilateral. Strength within normal limits in all groups bilateral.   Xrays from urgent care- normal  Assessment and Plan: Problem List Items Addressed This Visit    None    Visit Diagnoses    Ganglion cyst    -  Primary   Right foot pain           -Complete examination performed -Xrays reviewed from urgent care -Discussed treatement options for ganglion cyst right foot -Patient opt for aspiration of cyst at right foot. After verbal consent and aseptic prep and local nerve block was administered consisting of lidocaine and marcaine plain 2cc total then using a sterile syringe 1 cc of clear gelatenous fluid was aspirated from cyst and area dressed with antibiotic cream and coban compression wrap; patient to do the same over the next 2 to 3 days -Patient advised if recurs will require surgical excision  -Patient to return to office as needed or sooner if condition worsens.  Landis Martins, DPM

## 2017-01-20 ENCOUNTER — Ambulatory Visit: Payer: BLUE CROSS/BLUE SHIELD | Admitting: Sports Medicine

## 2017-02-02 ENCOUNTER — Encounter (HOSPITAL_COMMUNITY): Payer: Self-pay | Admitting: Emergency Medicine

## 2017-02-02 ENCOUNTER — Emergency Department (HOSPITAL_COMMUNITY)
Admission: EM | Admit: 2017-02-02 | Discharge: 2017-02-02 | Disposition: A | Discharge status: Home / Self Care | Payer: BLUE CROSS/BLUE SHIELD | Attending: Emergency Medicine | Admitting: Emergency Medicine

## 2017-02-02 DIAGNOSIS — S39012A Strain of muscle, fascia and tendon of lower back, initial encounter: Secondary | ICD-10-CM | POA: Insufficient documentation

## 2017-02-02 DIAGNOSIS — J45909 Unspecified asthma, uncomplicated: Secondary | ICD-10-CM | POA: Insufficient documentation

## 2017-02-02 DIAGNOSIS — Y999 Unspecified external cause status: Secondary | ICD-10-CM | POA: Insufficient documentation

## 2017-02-02 DIAGNOSIS — Z79899 Other long term (current) drug therapy: Secondary | ICD-10-CM | POA: Insufficient documentation

## 2017-02-02 DIAGNOSIS — Y92007 Garden or yard of unspecified non-institutional (private) residence as the place of occurrence of the external cause: Secondary | ICD-10-CM | POA: Insufficient documentation

## 2017-02-02 DIAGNOSIS — S3992XA Unspecified injury of lower back, initial encounter: Secondary | ICD-10-CM | POA: Diagnosis present

## 2017-02-02 DIAGNOSIS — I10 Essential (primary) hypertension: Secondary | ICD-10-CM | POA: Diagnosis not present

## 2017-02-02 DIAGNOSIS — Y9389 Activity, other specified: Secondary | ICD-10-CM | POA: Diagnosis not present

## 2017-02-02 DIAGNOSIS — X509XXA Other and unspecified overexertion or strenuous movements or postures, initial encounter: Secondary | ICD-10-CM | POA: Diagnosis not present

## 2017-02-02 LAB — URINALYSIS, ROUTINE W REFLEX MICROSCOPIC
Bilirubin Urine: NEGATIVE
Glucose, UA: NEGATIVE mg/dL
HGB URINE DIPSTICK: NEGATIVE
KETONES UR: NEGATIVE mg/dL
Leukocytes, UA: NEGATIVE
Nitrite: NEGATIVE
PROTEIN: NEGATIVE mg/dL
Specific Gravity, Urine: 1.027 (ref 1.005–1.030)
pH: 5 (ref 5.0–8.0)

## 2017-02-02 MED ORDER — METHOCARBAMOL 500 MG PO TABS
500.0000 mg | ORAL_TABLET | Freq: Three times a day (TID) | ORAL | 0 refills | Status: DC | PRN
Start: 2017-02-02 — End: 2018-11-19

## 2017-02-02 MED ORDER — KETOROLAC TROMETHAMINE 30 MG/ML IJ SOLN
30.0000 mg | Freq: Once | INTRAMUSCULAR | Status: AC
  Administered 2017-02-02: 30 mg via INTRAVENOUS
  Filled 2017-02-02: qty 1

## 2017-02-02 MED ORDER — DEXAMETHASONE SODIUM PHOSPHATE 10 MG/ML IJ SOLN
10.0000 mg | Freq: Once | INTRAMUSCULAR | Status: AC
  Administered 2017-02-02: 10 mg via INTRAVENOUS
  Filled 2017-02-02: qty 1

## 2017-02-02 MED ORDER — PREDNISONE 20 MG PO TABS: 40.0000 mg | ORAL_TABLET | Freq: Every day | ORAL | 0 refills | Status: DC

## 2017-02-02 MED ORDER — OXYCODONE-ACETAMINOPHEN 5-325 MG PO TABS
2.0000 | ORAL_TABLET | Freq: Once | ORAL | Status: AC
  Administered 2017-02-02: 2 via ORAL
  Filled 2017-02-02: qty 2

## 2017-02-02 MED ORDER — OXYCODONE-ACETAMINOPHEN 5-325 MG PO TABS: 1.0000 | ORAL_TABLET | Freq: Four times a day (QID) | ORAL | 0 refills | Status: DC | PRN

## 2017-02-02 MED ORDER — LIDOCAINE 5 % EX PTCH
1.0000 | MEDICATED_PATCH | CUTANEOUS | Status: DC
  Administered 2017-02-02: 1 via TRANSDERMAL
  Filled 2017-02-02: qty 1

## 2017-02-02 MED ORDER — HYDROMORPHONE HCL 1 MG/ML IJ SOLN
1.0000 mg | Freq: Once | INTRAMUSCULAR | Status: AC
  Administered 2017-02-02: 1 mg via INTRAVENOUS
  Filled 2017-02-02: qty 1

## 2017-02-02 MED ORDER — METHOCARBAMOL 500 MG PO TABS
1000.0000 mg | ORAL_TABLET | Freq: Once | ORAL | Status: AC
  Administered 2017-02-02: 1000 mg via ORAL
  Filled 2017-02-02: qty 2

## 2017-02-02 MED ORDER — LIDOCAINE 5 % EX PTCH: 1.0000 | MEDICATED_PATCH | CUTANEOUS | 0 refills | Status: DC

## 2017-02-02 NOTE — ED Provider Notes (Signed)
[facility] DEPT Provider Note   CSN: [id number] Arrival date & time: 02/02/17  0419     History   Chief Complaint Chief Complaint  Patient presents with  . Back Pain    HPI [name] is a 41 y.o. female.  41 year old female presents to the emergency department for evaluation of low back pain. Symptoms began 2 days ago when she was trying to prevent her dog from leaving their yard. She reports grabbing the collar and being twisted, noting onset of pain at this time. Pain has remained constant, sharp, and is worse with movement and ambulation. Patient has taken [facility] powders for pain without relief. No associated fevers, bowel incontinence, bladder incontinence, extremity numbness or paresthesias, or weakness. Patient with history of chronic low back pain.   The history is provided by the patient. No language interpreter was used.    Past Medical History:  Diagnosis Date  . Anemia   . Anginal pain ([place])    Pt has chest pains but pt states she was informed is related to acid reflux  . Asthma    pt denies  . Bronchitis   . Chronic abdominal pain   . Chronic bronchitis ([place])   . Chronic constipation   . Chronic lower back pain   . GERD (gastroesophageal reflux disease)   . High cholesterol   . 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 History of hiatal hernia   . History of urinary tract infection   . Hypertension   . Migraine    "3-4 times/month" (07/15/2016)  . Pneumonia ~ 2000 X 1  . PONV (postoperative nausea and vomiting)   . Pre-diabetes   . Pseudotumor cerebri dx'd ealy 2000s  . Seizures ([place])    in childhood / teenage years   . Vision disturbance    "related to [facility]"    Patient Active Problem List   Diagnosis Date Noted  . Vomiting 07/15/2016  . Dehydration 07/10/2016  . Prediabetes 06/23/2016  . Hiatal hernia 06/23/2016  . Hypercholesterolemia with hypertriglyceridemia 06/23/2016  . S/P laparoscopic sleeve  gastrectomy 06/23/2016  . IIH (idiopathic intracranial hypertension) 03/03/2016  . Second degree hemorrhoids   . Headache 01/05/2016  . Chest pain 01/01/2016  . Pseudotumor cerebri 01/01/2016  . HTN (hypertension) 01/01/2016  . Obesity (BMI 30-39.9) 01/01/2016  . Asthma 01/01/2016  . GERD (gastroesophageal reflux disease) 01/01/2016  . Chest pain syndrome 01/01/2016  . Rectal bleeding 12/26/2015  . Chest discomfort 12/26/2015  . Frequent headaches 12/26/2015  . Pain in joint, lower leg 04/11/2014  . Stiffness of joint, not elsewhere classified, pelvic region and thigh 04/11/2014  . Difficulty in walking(719.7) 04/11/2014  . 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 Rhabdomyolysis 06/27/2013  . Hypokalemia 06/27/2013  . Bruising 06/27/2013  . ACHILLES TENDON RUPTURE 05/29/2008    Past Surgical History:  Procedure Laterality Date  . ABDOMINAL HYSTERECTOMY  "early 2000s"   "partial"  . ACHILLES TENDON LENGTHENING Left   . COLONOSCOPY  2008   Dr. Gala Romney: friable anal canal, otherwise normal  . COLONOSCOPY N/A 01/23/2016   [id number]:ERDEYCXK hemorrhoids  . DILATION AND CURETTAGE OF UTERUS    . ESOPHAGOGASTRODUODENOSCOPY  2009   Dr. Gala Romney: normal   . ESOPHAGOGASTRODUODENOSCOPY N/A 01/23/2016   GYJ:EHUDJS esophagus/small HH  . HERNIA REPAIR    . HIATAL HERNIA REPAIR  06/23/2016   Procedure: LAPAROSCOPIC REPAIR OF HIATAL HERNIA;  Surgeon: Greer Pickerel, MD;  Location: [facility] ORS;  Service: General;;  . LAPAROSCOPIC CHOLECYSTECTOMY    . LAPAROSCOPIC GASTRIC SLEEVE RESECTION N/A 06/23/2016   Procedure: LAPAROSCOPIC GASTRIC SLEEVE RESECTION WITH UPPER ENDO;  Surgeon: Greer Pickerel, MD;  Location: [facility] ORS;  Service: General;  Laterality: N/A;  . LUMBAR PUNCTURE  "several since early 20's"  . vaginal trauma     secondary to fall in childhood / did have to have surgery pt not sure of details    OB History    Gravida Para Term Preterm AB Living   '3 3 3     3   '$ SAB TAB Ectopic Multiple Live Births                    Home Medications    Prior to Admission medications   Medication Sig Start Date End Date Taking? Authorizing Provider  amLODipine (NORVASC) 5 MG tablet Take 1 tablet (5 mg total) by mouth daily. Patient taking differently: Take 5 mg by mouth. Patient reports not taking since 08/04/2016 01/03/16   Ripudeep Krystal Eaton, MD  atorvastatin (LIPITOR) 40 MG tablet Take 1 tablet (40 mg total) by mouth at bedtime. Patient not taking: Reported on 08/19/2016 01/03/16   Ripudeep Krystal Eaton, MD  azelastine (OPTIVAR) 0.05 % ophthalmic solution Place 1 drop into both eyes 3 (three) times daily as needed (ALLERGIES).  01/17/16   Historical Provider, MD  BIOTIN PO Take 1 tablet by mouth daily.    Historical Provider, MD  CALCIUM PO Take 1 tablet by mouth 3 (three) times daily.    Historical Provider, MD  cyanocobalamin (,VITAMIN B-12,) 1000 MCG/ML injection Inject 1,000 mcg into the muscle every 30 (thirty) days. DUE 08-2016    Historical Provider, MD  dexlansoprazole (DEXILANT) 60 MG capsule Take 1 capsule (60 mg total) by mouth daily. 05/22/16   Annitta Needs, NP  fluticasone [facility]) 50 MCG/ACT nasal spray Place 2 sprays into both nostrils daily.    Historical Provider, MD  levocetirizine (XYZAL) 5 MG tablet Take 5 mg by mouth daily.    Historical Provider, MD  lidocaine (LIDODERM) 5 % Place 1 patch onto the skin daily. Remove & Discard patch within 12 hours or as directed by MD 02/02/17   Antonietta Breach, PA-C  methocarbamol (ROBAXIN) 500 MG tablet Take 1 tablet (500 mg total) by mouth every 8 (eight) hours as needed for muscle spasms. 02/02/17   Antonietta Breach, PA-C  montelukast (SINGULAIR) 10 MG tablet Take 10 mg by mouth daily.    Historical Provider, MD  Multiple Vitamin (MULTIVITAMIN WITH MINERALS) TABS tablet Take 1 tablet by mouth daily.    Historical Provider, MD  ondansetron (ZOFRAN-ODT) 4 MG disintegrating tablet Take 1 tablet (4 mg total) by mouth every 6 (six) hours as needed for nausea. Patient  not taking: Reported on 08/19/2016 07/17/16   Greer Pickerel, MD  oxyCODONE-acetaminophen (PERCOCET/ROXICET) 5-325 MG tablet Take 1-2 tablets by mouth every 6 (six) hours as needed for severe pain. 02/02/17   Antonietta Breach, PA-C  pantoprazole (PROTONIX) 40 MG tablet Take 40 mg by mouth 2 (two) times daily.    Historical Provider, MD  predniSONE (DELTASONE) 20 MG tablet Take 2 tablets (40 mg total) by mouth daily. Take 40 mg by mouth daily for 3 days, then '20mg'$  by mouth daily for 3 days, then '10mg'$  daily for 3 days 02/02/17   Antonietta Breach, PA-C  sucralfate (CARAFATE) 1 GM/10ML suspension Take 10 mLs (1 g total) by mouth 4 (four) times daily -  with  meals and at bedtime. Patient not taking: Reported on 08/19/2016 07/17/16   Greer Pickerel, MD  topiramate (TOPAMAX) 25 MG tablet Take 25 mg by mouth daily. 12/31/15   Historical Provider, MD    Family History Family History  Problem Relation Age of Onset  . Migraines Mother   . Cancer Maternal Uncle   . Cancer Maternal Uncle   . Cancer Paternal Grandmother   . Cancer Paternal Grandfather   . Colon cancer      unknown    Social History Social History  Substance Use Topics  . Smoking status: Never Smoker  . Smokeless tobacco: Never Used  . Alcohol use 0.0 oz/week     Comment: hx of wine - 05/2016      Allergies   Patient has no known allergies.   Review of Systems Review of Systems Ten systems reviewed and are negative for acute change, except as noted in the HPI.    Physical Exam Updated Vital Signs BP (!) 138/93   Pulse 63   Temp 97.9 F (36.6 C) (Oral)   Resp 18   Ht '5\' 10"'$  (1.778 m)   Wt 84.8 kg   LMP 11/11/2011   SpO2 99%   BMI 26.83 kg/m   Physical Exam  Constitutional: She is oriented to person, place, and time. She appears well-developed and well-nourished. No distress.  Nontoxic appearing and in no acute distress  HENT:  Head: Normocephalic and atraumatic.  Eyes: Conjunctivae and EOM are normal. No scleral icterus.    Neck: Normal range of motion.  Cardiovascular: Normal rate, regular rhythm and intact distal pulses.   DP pulse 2+ bilaterally  Pulmonary/Chest: Effort normal. No respiratory distress.  Respirations even and unlabored  Musculoskeletal: Normal range of motion. She exhibits tenderness.  Tenderness to palpation to the lumbar midline at approximately L4/5. No bony deformities, step-offs, or crepitus. There is surrounding paraspinal tenderness. No appreciable spasm.  Neurological: She is alert and oriented to person, place, and time. She exhibits normal muscle tone. Coordination normal.  Sensation to light touch intact in bilateral lower extremities. Patient ambulatory to the bathroom without difficulty.  Skin: Skin is warm and dry. No rash noted. She is not diaphoretic. No erythema. No pallor.  Psychiatric: She has a normal mood and affect. Her behavior is normal.  Nursing note and vitals reviewed.    ED Treatments / Results  Labs (all labs ordered are listed, but only abnormal results are displayed) Labs Reviewed  URINALYSIS, ROUTINE W REFLEX MICROSCOPIC - Abnormal; Notable for the following:       Result Value   APPearance HAZY (*)    All other components within normal limits    EKG  EKG Interpretation None       Radiology No results found.  Procedures Procedures (including critical care time)  Medications Ordered in ED Medications  lidocaine (LIDODERM) 5 % 1 patch (not administered)  ketorolac (TORADOL) 30 MG/ML injection 30 mg (30 mg Intravenous Given 02/02/17 0501)  HYDROmorphone (DILAUDID) injection 1 mg (1 mg Intravenous Given 02/02/17 0502)  methocarbamol (ROBAXIN) tablet 1,000 mg (1,000 mg Oral Given 02/02/17 0502)  dexamethasone (DECADRON) injection 10 mg (10 mg Intravenous Given 02/02/17 0541)  oxyCODONE-acetaminophen (PERCOCET/ROXICET) 5-325 MG per tablet 2 tablet (2 tablets Oral Given 02/02/17 0541)     Initial Impression / Assessment and Plan / ED Course  I  have reviewed the triage vital signs and the nursing notes.  Pertinent labs & imaging results that were available during my  care of the patient were reviewed by me and considered in my medical decision making (see chart for details).     Patient with back pain. She is neurovascularly intact on exam. Patient can walk but states is painful. No loss of bowel or bladder control. No concern for cauda equina. No fevers, h/o cancer, or reported IVDU. RICE protocol and pain medicine indicated and discussed with patient. She has been instructed to follow-up with her primary care doctor to ensure resolution of symptoms. Return precautions provided at discharge. Patient agreeable to plan with no unaddressed concerns.   Final Clinical Impressions(s) / ED Diagnoses   Final diagnoses:  Strain of lumbar region, initial encounter    New Prescriptions New Prescriptions   LIDOCAINE (LIDODERM) 5 %    Place 1 patch onto the skin daily. Remove & Discard patch within 12 hours or as directed by MD   METHOCARBAMOL (ROBAXIN) 500 MG TABLET    Take 1 tablet (500 mg total) by mouth every 8 (eight) hours as needed for muscle spasms.   OXYCODONE-ACETAMINOPHEN (PERCOCET/ROXICET) 5-325 MG TABLET    Take 1-2 tablets by mouth every 6 (six) hours as needed for severe pain.   PREDNISONE (DELTASONE) 20 MG TABLET    Take 2 tablets (40 mg total) by mouth daily. Take 40 mg by mouth daily for 3 days, then '20mg'$  by mouth daily for 3 days, then '10mg'$  daily for 3 days     Antonietta Breach, PA-C 02/02/17 0545    Veryl Speak, MD 02/02/17 [phone]

## 2017-02-02 NOTE — Discharge Instructions (Signed)
Take prednisone and Lidoderm patches as prescribed. You may add Robaxin for muscle spasms and Percocet for severe pain as needed. Alternate ice and heat packs 3-4 times per day. Follow up with your primary care doctor to ensure resolution of symptoms.

## 2017-02-02 NOTE — ED Triage Notes (Signed)
Pt presents with low back pain middle to R side. Pt state she was attempting to walk a pit bull 2 days ago and was pulled forward hard by dog. Pt having difficulty walking and sleeping d/t pain. Pt denies urinary incontinence.

## 2017-02-19 ENCOUNTER — Encounter: Payer: BLUE CROSS/BLUE SHIELD | Attending: General Surgery | Admitting: Skilled Nursing Facility1

## 2017-02-19 ENCOUNTER — Encounter: Payer: Self-pay | Admitting: Skilled Nursing Facility1

## 2017-02-19 DIAGNOSIS — Z713 Dietary counseling and surveillance: Secondary | ICD-10-CM | POA: Diagnosis not present

## 2017-02-19 DIAGNOSIS — E669 Obesity, unspecified: Secondary | ICD-10-CM

## 2017-02-19 NOTE — Progress Notes (Signed)
  Follow-up visit:  3 Months Post-Operative Sleeve Gastrectomy Surgery  Medical Nutrition Therapy:  Appt start time: 200 end time:  240  Primary concerns today: Post-operative Bariatric Surgery Nutrition Management.   Pt states she has a lot of trouble sleeping at night due to her unhappiness with her body (lose belly skin and sagging breasts). Pt states she found a plastic surgeon she really likes and wants to get a tummy tuck and breast lift. Pt states she will start talking to a psychologist. Dietitian referred the pt to a dietitian more familiar with sports nutrition due to her wanting a lean muscular body.  Surgery date: 06/23/2016 Surgery type: sleeve gastrectomy Start weight at [facility]: 261 lbs on 05/14/2016 Weight today: 179.6 lbs  Weight change: 14.4 lbs  TANITA  BODY COMP RESULTS  06/10/16 07/09/16 08/19/16 09/18/16 11/20/2016 02/19/2017   BMI (kg/m^2) 38.9 49.5 35.3 31.0 28.6 25.77   Fat Mass (lbs) 136 124.2 115.2 99.4 78.6 47   Fat Free Mass (lbs) 127.6 126.6 123.6 123.0 115.4 132.6   Total Body Water (lbs) 94.4 93.2 90.6 89.6 83 94.4    Preferred Learning Style:   No preference indicated   Learning Readiness:   Ready  24-hr recall: B (AM): 2 boiled eggs with pickled  (12 g) Snk (AM): none or pumpkin seed ? bar or light and fit greek yogurt (0-12g) L (PM): 5 mussels or salad with chicken (eats on it for a while) (5-21 g) Snk (PM): 5 x week Iso-100 protein shake (25 g) D (PM): none or can of sardines or 3 oz chicken (0-21 g) Snk (PM): none   Fluid intake: 12 oz protein shake, over 64 oz water (hard to get fluid in on days off) Estimated total protein intake: 60-75 g lately  Medications: see list  Supplementation: taking  Using straws: No Drinking while eating: No Hair loss: Cut hair - can't tell  Carbonated beverages: No N/V/D/C: No and reflux is better  Dumping syndrome: once with a smoothie  Recent physical activity:  Works out 5 days per week mix of cardio and  strength  Progress Towards Goal(s):  In progress.  Handouts given during visit include:  none   Nutritional Diagnosis:  [place]-3.3 Overweight/obesity related to past poor dietary habits and physical inactivity as evidenced by patient w/ recent sleeve gastrectomy surgery following dietary guidelines for continued weight loss.    Intervention:  Nutrition education/diet advancement. Goals:  Follow Phase 3B: High Protein + Non-Starchy Vegetables  Eat 3-6 small meals/snacks, every 3-5 hrs  Increase lean protein foods to meet 60g goal  Increase fluid intake to 64oz +  Avoid drinking 15 minutes before, during and 30 minutes after eating  Aim for >30 min of physical activity daily  Try to eat 3 times per day or according to hunger  Try greek yogurt in place of mayo   Teaching Method Utilized:  Visual Auditory Hands on  Barriers to learning/adherence to lifestyle change: none recently  Demonstrated degree of understanding via:  Teach Back   Monitoring/Evaluation:  Dietary intake, exercise, and body weight. Follow up in 2 months for 5 month post-op visit.

## 2017-03-25 IMAGING — CR DG FOOT COMPLETE 3+V*R*
3 series · 3 of 3 positions shown · non-contrast
Comparison: None.

CLINICAL DATA: Syncope, head injury, right foot injury

EXAM:
RIGHT FOOT COMPLETE - 3+ VIEW

[t foot ap right]
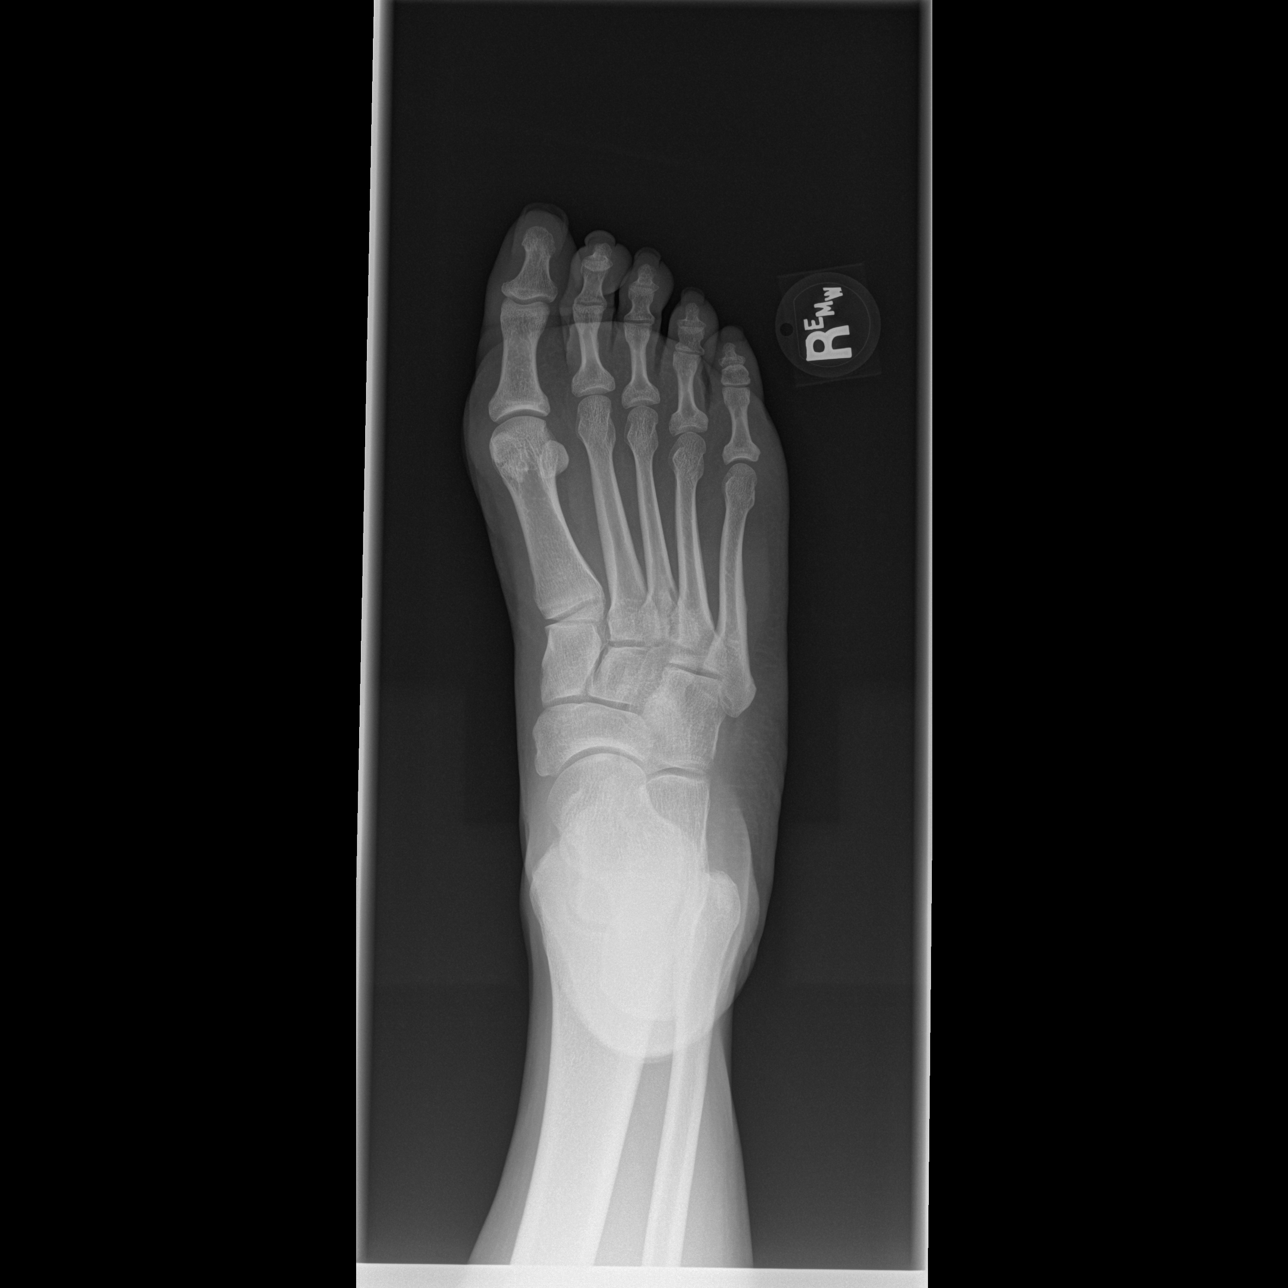

[t foot oblique right]
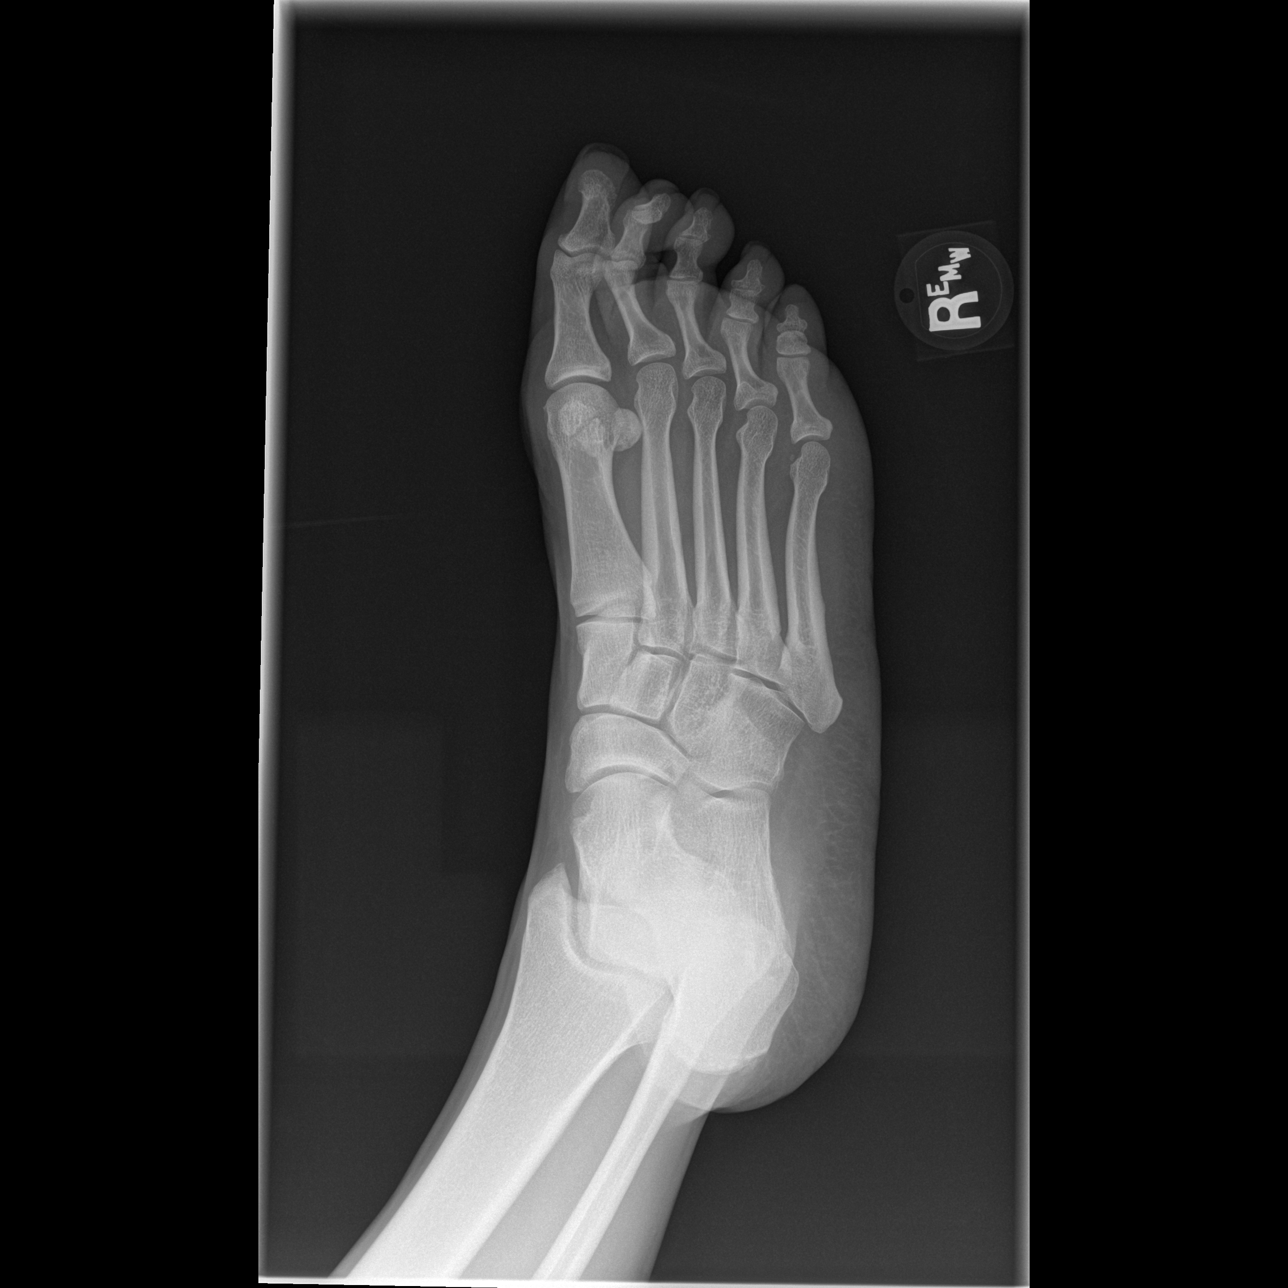

[t foot lat right]
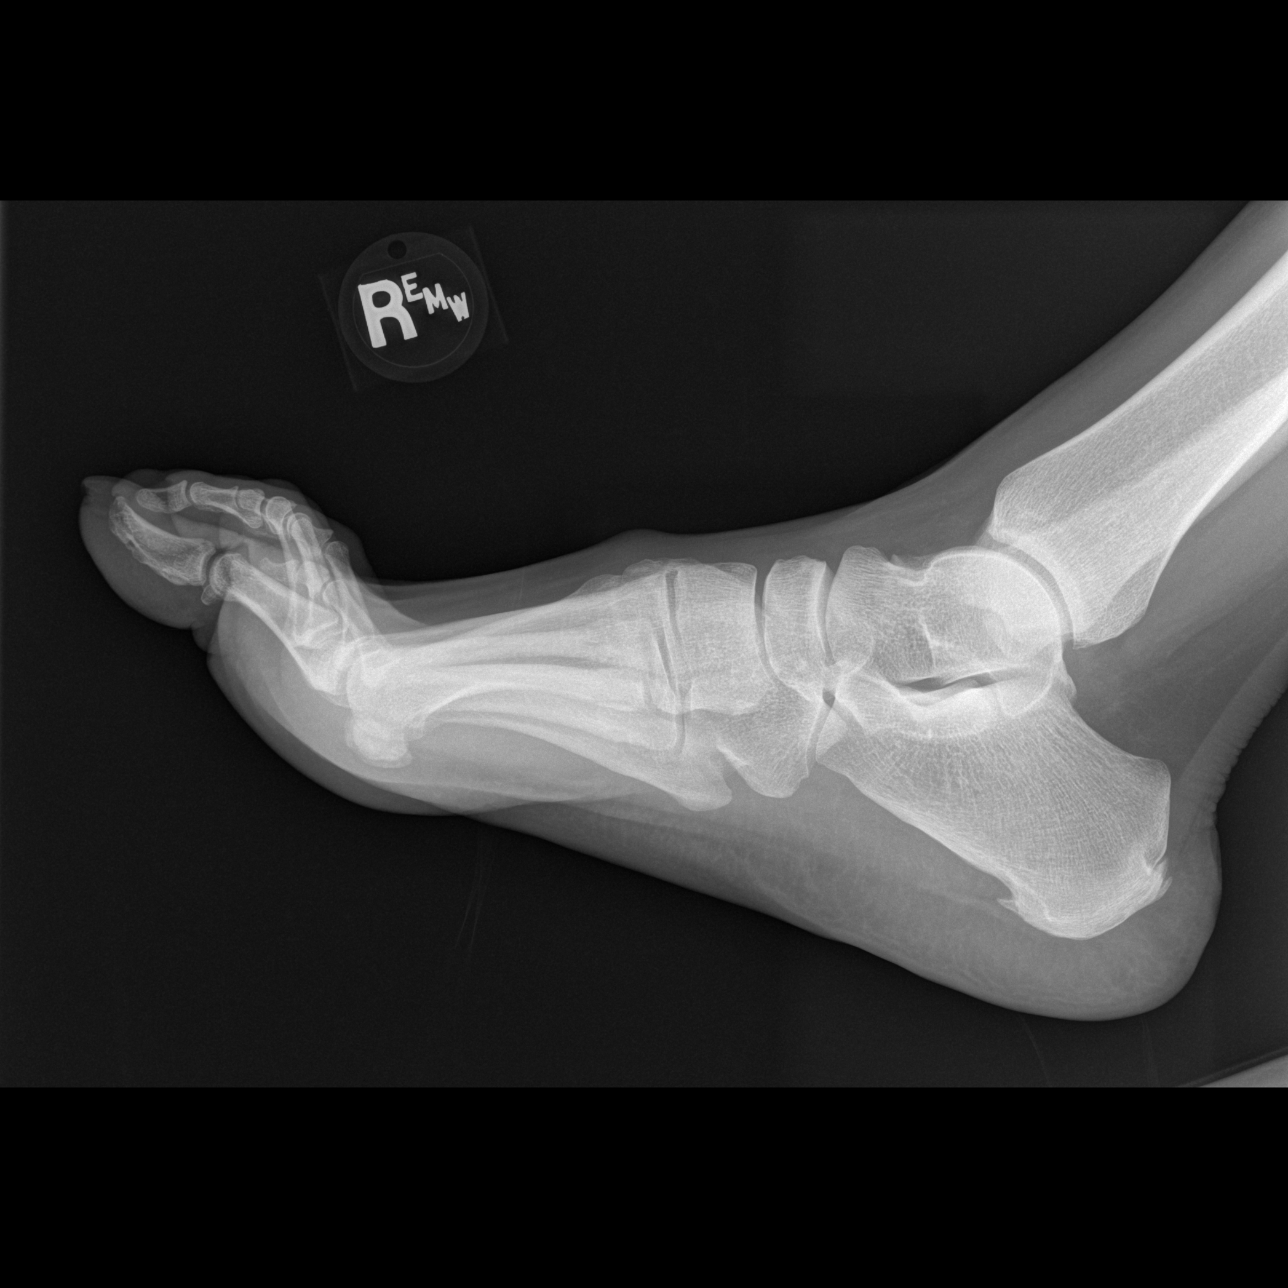

[3 of 3 positions shown; findings below may reference images not displayed]

FINDINGS: There is no evidence of fracture or dislocation. There is a small
plantar calcaneal spur. There is a bipartite medial hallux sesamoid.
Soft tissues are unremarkable.
IMPRESSION: No acute osseous injury of the right foot.

## 2017-12-30 ENCOUNTER — Other Ambulatory Visit (HOSPITAL_COMMUNITY): Payer: Self-pay | Admitting: Family Medicine

## 2017-12-30 DIAGNOSIS — Z1231 Encounter for screening mammogram for malignant neoplasm of breast: Secondary | ICD-10-CM

## 2018-01-07 ENCOUNTER — Other Ambulatory Visit (HOSPITAL_COMMUNITY)
Admission: RE | Admit: 2018-01-07 | Discharge: 2018-01-07 | Disposition: A | Discharge status: Home / Self Care | Payer: Commercial Indemnity | Source: Ambulatory Visit | Attending: Obstetrics and Gynecology | Admitting: Obstetrics and Gynecology

## 2018-01-07 ENCOUNTER — Encounter: Payer: Self-pay | Admitting: Obstetrics and Gynecology

## 2018-01-07 ENCOUNTER — Ambulatory Visit: Payer: Medicaid Other | Admitting: Obstetrics and Gynecology

## 2018-01-07 ENCOUNTER — Ambulatory Visit (HOSPITAL_COMMUNITY): Payer: BLUE CROSS/BLUE SHIELD

## 2018-01-07 VITALS — BP 118/84 | HR 69 | Ht 70.0 in | Wt 211.0 lb

## 2018-01-07 DIAGNOSIS — Z01419 Encounter for gynecological examination (general) (routine) without abnormal findings: Secondary | ICD-10-CM | POA: Insufficient documentation

## 2018-01-07 DIAGNOSIS — Z0000 Encounter for general adult medical examination without abnormal findings: Secondary | ICD-10-CM

## 2018-01-07 MED ORDER — BORIC ACID CRYS: 600.0000 mg | CRYSTALS | Freq: Every day | 5 refills | Status: DC
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
## 2018-01-07 NOTE — Progress Notes (Signed)
NGYN patient presents for Annual Exam   C/O: vaginal odor this past year that is strong, pt has been dealing for years with BV and yeast.  pt is highly concerned Also  Notes off/on abdominal  Pain 10/10x .

## 2018-01-07 NOTE — Patient Instructions (Signed)

## 2018-01-08 ENCOUNTER — Encounter (HOSPITAL_COMMUNITY): Payer: Self-pay

## 2018-01-08 ENCOUNTER — Ambulatory Visit (HOSPITAL_COMMUNITY)
Admission: RE | Admit: 2018-01-08 | Discharge: 2018-01-08 | Disposition: A | Discharge status: Home / Self Care | Payer: Commercial Indemnity | Source: Ambulatory Visit | Attending: Family Medicine | Admitting: Family Medicine

## 2018-01-08 DIAGNOSIS — Z1231 Encounter for screening mammogram for malignant neoplasm of breast: Secondary | ICD-10-CM | POA: Insufficient documentation

## 2018-01-08 LAB — COMPREHENSIVE METABOLIC PANEL
A/G RATIO: 1.5 (ref 1.2–2.2)
ALT: 14 IU/L (ref 0–32)
AST: 18 IU/L (ref 0–40)
Albumin: 4.4 g/dL (ref 3.5–5.5)
Alkaline Phosphatase: 51 IU/L (ref 39–117)
BUN/Creatinine Ratio: 11 (ref 9–23)
BUN: 9 mg/dL (ref 6–24)
Bilirubin Total: <0.2 mg/dL (ref 0.0–1.2)
CALCIUM: 8.9 mg/dL (ref 8.7–10.2)
CO2: 20 mmol/L (ref 20–29)
CREATININE: 0.83 mg/dL (ref 0.57–1.00)
Chloride: 109 mmol/L — ABNORMAL HIGH (ref 96–106)
GFR calc Af Amer: 102 mL/min/{1.73_m2} (ref >59)
GFR calc non Af Amer: 88 mL/min/{1.73_m2} (ref >59)
GLUCOSE: 76 mg/dL (ref 65–99)
Globulin, Total: 2.9 g/dL (ref 1.5–4.5)
POTASSIUM: 3.8 mmol/L (ref 3.5–5.2)
Sodium: 145 mmol/L — ABNORMAL HIGH (ref 134–144)
Total Protein: 7.3 g/dL (ref 6.0–8.5)

## 2018-01-08 LAB — CBC
Hematocrit: 40.8 % (ref 34.0–46.6)
Hemoglobin: 13.8 g/dL (ref 11.1–15.9)
MCH: 29.7 pg (ref 26.6–33.0)
MCHC: 33.8 g/dL (ref 31.5–35.7)
MCV: 88 fL (ref 79–97)
PLATELETS: 342 10*3/uL (ref 150–379)
RBC: 4.64 x10E6/uL (ref 3.77–5.28)
RDW: 14.7 % (ref 12.3–15.4)
WBC: 4.2 10*3/uL (ref 3.4–10.8)

## 2018-01-08 LAB — LIPID PANEL
CHOLESTEROL TOTAL: 183 mg/dL (ref 100–199)
Chol/HDL Ratio: 2.8 ratio (ref 0.0–4.4)
HDL: 65 mg/dL (ref >39)
LDL Calculated: 107 mg/dL — ABNORMAL HIGH (ref 0–99)
TRIGLYCERIDES: 57 mg/dL (ref 0–149)
VLDL CHOLESTEROL CAL: 11 mg/dL (ref 5–40)

## 2018-01-08 LAB — CERVICOVAGINAL ANCILLARY ONLY
CHLAMYDIA, DNA PROBE: NEGATIVE
NEISSERIA GONORRHEA: NEGATIVE
TRICH (WINDOWPATH): NEGATIVE

## 2018-01-08 LAB — HEMOGLOBIN A1C
ESTIMATED AVERAGE GLUCOSE: 105 mg/dL
Hgb A1c MFr Bld: 5.3 % (ref 4.8–5.6)

## 2018-01-08 LAB — TSH: TSH: 1.09 u[IU]/mL (ref 0.450–4.500)

## 2018-01-20 ENCOUNTER — Encounter (HOSPITAL_COMMUNITY): Payer: Self-pay

## 2018-07-30 ENCOUNTER — Ambulatory Visit (HOSPITAL_COMMUNITY)
Admission: RE | Admit: 2018-07-30 | Discharge: 2018-07-30 | Disposition: A | Discharge status: Home / Self Care | Payer: Managed Care, Other (non HMO) | Source: Ambulatory Visit | Attending: Family Medicine | Admitting: Family Medicine

## 2018-07-30 ENCOUNTER — Other Ambulatory Visit: Payer: Self-pay

## 2018-07-30 ENCOUNTER — Other Ambulatory Visit (HOSPITAL_COMMUNITY): Payer: Self-pay | Admitting: Family Medicine

## 2018-07-30 DIAGNOSIS — Z01818 Encounter for other preprocedural examination: Secondary | ICD-10-CM | POA: Diagnosis present

## 2018-10-05 ENCOUNTER — Emergency Department (HOSPITAL_BASED_OUTPATIENT_CLINIC_OR_DEPARTMENT_OTHER)
Admission: EM | Admit: 2018-10-05 | Discharge: 2018-10-05 | Disposition: A | Discharge status: Home / Self Care | Payer: Managed Care, Other (non HMO) | Attending: Emergency Medicine | Admitting: Emergency Medicine

## 2018-10-05 ENCOUNTER — Other Ambulatory Visit: Payer: Self-pay

## 2018-10-05 ENCOUNTER — Encounter (HOSPITAL_BASED_OUTPATIENT_CLINIC_OR_DEPARTMENT_OTHER): Payer: Self-pay

## 2018-10-05 DIAGNOSIS — Z79899 Other long term (current) drug therapy: Secondary | ICD-10-CM | POA: Insufficient documentation

## 2018-10-05 DIAGNOSIS — K91873 Postprocedural seroma of a digestive system organ or structure following other procedure: Secondary | ICD-10-CM | POA: Insufficient documentation

## 2018-10-05 DIAGNOSIS — R1084 Generalized abdominal pain: Secondary | ICD-10-CM | POA: Diagnosis not present

## 2018-10-05 DIAGNOSIS — I10 Essential (primary) hypertension: Secondary | ICD-10-CM | POA: Insufficient documentation

## 2018-10-05 DIAGNOSIS — R1900 Intra-abdominal and pelvic swelling, mass and lump, unspecified site: Secondary | ICD-10-CM | POA: Diagnosis present

## 2018-10-05 DIAGNOSIS — S301XXA Contusion of abdominal wall, initial encounter: Secondary | ICD-10-CM

## 2018-10-05 LAB — URINALYSIS, ROUTINE W REFLEX MICROSCOPIC
BILIRUBIN URINE: NEGATIVE
Glucose, UA: NEGATIVE mg/dL
HGB URINE DIPSTICK: NEGATIVE
Ketones, ur: NEGATIVE mg/dL
Leukocytes, UA: NEGATIVE
NITRITE: NEGATIVE
PROTEIN: NEGATIVE mg/dL
SPECIFIC GRAVITY, URINE: 1.01 (ref 1.005–1.030)
pH: 6 (ref 5.0–8.0)

## 2018-10-05 MED ORDER — LIDOCAINE-EPINEPHRINE (PF) 2 %-1:200000 IJ SOLN
10.0000 mL | Freq: Once | INTRAMUSCULAR | Status: DC
  Filled 2018-10-05: qty 10

## 2018-10-05 MED ORDER — LIDOCAINE-EPINEPHRINE (PF) 1 %-1:200000 IJ SOLN
10.0000 mL | Freq: Once | INTRAMUSCULAR | Status: AC
  Administered 2018-10-05: 10 mL via INTRADERMAL

## 2018-10-05 MED ORDER — LIDOCAINE-EPINEPHRINE (PF) 1 %-1:200000 IJ SOLN
INTRAMUSCULAR | Status: AC
  Filled 2018-10-05: qty 10

## 2018-10-05 NOTE — ED Triage Notes (Signed)
Pt states she had gastric sleeve in 2017-surgery for multiple sites skin removal in [place] 12/12-c/o fluid retention with pain to abd x 2 days-NAD-slow gait

## 2018-10-05 NOTE — ED Notes (Signed)
ED Provider at bedside. 

## 2018-10-05 NOTE — ED Provider Notes (Signed)
[facility] Provider Note   CSN: [id number] Arrival date & time: 10/05/18  1355     History   Chief Complaint Chief Complaint  Patient presents with  . Swelling  . Abdominal Pain    HPI [name] is a 42 y.o. female.  Patient is a 42 year old female with a past history of recent travel to [place] and surgery at the beginning of December for skin removal after over 100 pound weight loss.  She had liposuction, tummy tuck, breast reduction and augmentation.  She was in [place] until Christmas Eve and then came home.  She states everything with the surgery went well however she had a drain placed in her lower abdomen and it was pulled too soon.  It was supposed to be taken out when it was draining less than 50 and it was still draining more than 100 when it was pulled.  She saw her surgeon in [place] who aspirated over 100 mL's from a seroma.  She has been home wearing her binder but states the fluid is reaccumulated over the last week and is getting more uncomfortable and swollen.  She went to see her regular doctor who states they would not be able to help her and recommended she come here.  The history is provided by the patient.  Abdominal Pain   This is a recurrent problem.    Past Medical History:  Diagnosis Date  . Anemia   . Anginal pain ([place])    Pt has chest pains but pt states she was informed is related to acid reflux  . Asthma    pt denies  . Bronchitis   . Chronic abdominal pain   . Chronic bronchitis ([place])   . Chronic constipation   . Chronic lower back pain   . GERD (gastroesophageal reflux disease)   . High cholesterol   . 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 History of hiatal hernia   . History of urinary tract infection   . Hypertension   . Migraine    "3-4 times/month" (07/15/2016)  . Pneumonia ~ 2000 X 1  . PONV (postoperative nausea and vomiting)   . Pre-diabetes   . Pseudotumor  cerebri dx'd ealy 2000s  . Seizures ([place])    in childhood / teenage years   . Vision disturbance    "related to [facility]"    Patient Active Problem List   Diagnosis Date Noted  . Vomiting 07/15/2016  . Dehydration 07/10/2016  . Prediabetes 06/23/2016  . Hiatal hernia 06/23/2016  . Hypercholesterolemia with hypertriglyceridemia 06/23/2016  . 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 IIH (idiopathic intracranial hypertension) 03/03/2016  . Second degree hemorrhoids   . Headache 01/05/2016  . Chest pain 01/01/2016  . Pseudotumor cerebri 01/01/2016  . HTN (hypertension) 01/01/2016  . Obesity (BMI 30-39.9) 01/01/2016  . Asthma 01/01/2016  . GERD (gastroesophageal reflux disease) 01/01/2016  . Chest pain syndrome 01/01/2016  . Rectal bleeding 12/26/2015  . Chest discomfort 12/26/2015  . Frequent headaches 12/26/2015  . Pain in joint, lower leg 04/11/2014  . Stiffness of joint, not elsewhere classified, pelvic region and thigh 04/11/2014  . Difficulty in walking(719.7) 04/11/2014  . 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 Rhabdomyolysis 06/27/2013  . Hypokalemia 06/27/2013  . Bruising 06/27/2013  . ACHILLES TENDON RUPTURE 05/29/2008    Past Surgical History:  Procedure Laterality Date  . ABDOMINAL HYSTERECTOMY  "early 2000s"   "partial"  .  ACHILLES TENDON LENGTHENING Left   . COLONOSCOPY  2008   Dr. Gala Romney: friable anal canal, otherwise normal  . COLONOSCOPY N/A 01/23/2016   KGY:JEHUDJSH hemorrhoids  . DILATION AND CURETTAGE OF UTERUS    . ESOPHAGOGASTRODUODENOSCOPY  2009   Dr. Gala Romney: normal   . ESOPHAGOGASTRODUODENOSCOPY N/A 01/23/2016   FWY:OVZCHY esophagus/small HH  . HERNIA REPAIR    . HIATAL HERNIA REPAIR  06/23/2016   Procedure: LAPAROSCOPIC REPAIR OF HIATAL HERNIA;  Surgeon: Greer Pickerel, MD;  Location: [facility];  Service: General;;  . LAPAROSCOPIC CHOLECYSTECTOMY    . LAPAROSCOPIC GASTRIC SLEEVE RESECTION N/A 06/23/2016   Procedure: LAPAROSCOPIC GASTRIC  SLEEVE RESECTION WITH UPPER ENDO;  Surgeon: Greer Pickerel, MD;  Location: [facility];  Service: General;  Laterality: N/A;  . LUMBAR PUNCTURE  "several since early 20's"  . vaginal trauma     secondary to fall in childhood / did have to have surgery pt not sure of details     OB History    Gravida  3   Para  3   Term  3   Preterm      AB      Living  3     SAB      TAB      Ectopic      Multiple      Live Births               Home Medications    Prior to Admission medications   Medication Sig Start Date End Date Taking? Authorizing Provider  amLODipine (NORVASC) 5 MG tablet Take 1 tablet (5 mg total) by mouth daily. Patient taking differently: Take 5 mg by mouth. Patient reports not taking since 08/04/2016 01/03/16   Rai, Vernelle Emerald, MD  atorvastatin (LIPITOR) 40 MG tablet Take 1 tablet (40 mg total) by mouth at bedtime. Patient not taking: Reported on 01/07/2018 01/03/16   Rai, Vernelle Emerald, MD  azelastine (OPTIVAR) 0.05 % ophthalmic solution Place 1 drop into both eyes 3 (three) times daily as needed (ALLERGIES).  01/17/16   [provider]  BIOTIN PO Take 1 tablet by mouth daily.    [provider]  Boric Acid CRYS Place 600 mg vaginally at bedtime. Use vaginally every night for two weeks then twice a week 01/07/18   Constant, Peggy, MD  CALCIUM PO Take 1 tablet by mouth 3 (three) times daily.    [provider]  cyanocobalamin (,VITAMIN B-12,) 1000 MCG/ML injection Inject 1,000 mcg into the muscle every 30 (thirty) days. DUE 08-2016    [provider]  dexlansoprazole (DEXILANT) 60 MG capsule Take 1 capsule (60 mg total) by mouth daily. 05/22/16   Annitta Needs, NP  fluticasone (FLONASE) 50 MCG/ACT nasal spray Place 2 sprays into both nostrils daily.    [provider]  levocetirizine (XYZAL) 5 MG tablet Take 5 mg by mouth daily.    [provider]  lidocaine (LIDODERM) 5 % Place 1 patch onto the skin daily. Remove &  Discard patch within 12 hours or as directed by MD 02/02/17   Antonietta Breach, PA-C  methocarbamol (ROBAXIN) 500 MG tablet Take 1 tablet (500 mg total) by mouth every 8 (eight) hours as needed for muscle spasms. 02/02/17   Antonietta Breach, PA-C  montelukast (SINGULAIR) 10 MG tablet Take 10 mg by mouth daily.    [provider]  Multiple Vitamin (MULTIVITAMIN WITH MINERALS) TABS tablet Take 1 tablet by mouth daily.    [provider]  ondansetron (ZOFRAN-ODT) 4 MG disintegrating tablet Take 1 tablet (4 mg total) by mouth every 6 (six) hours as needed for nausea. 07/17/16   Greer Pickerel, MD  oxyCODONE-acetaminophen (PERCOCET/ROXICET) 5-325 MG tablet Take 1-2 tablets by mouth every 6 (six) hours as needed for severe pain. 02/02/17   Antonietta Breach, PA-C  pantoprazole (PROTONIX) 40 MG tablet Take 40 mg by mouth 2 (two) times daily.    [provider]  predniSONE (DELTASONE) 20 MG tablet Take 2 tablets (40 mg total) by mouth daily. Take 40 mg by mouth daily for 3 days, then '20mg'$  by mouth daily for 3 days, then '10mg'$  daily for 3 days 02/02/17   Antonietta Breach, PA-C  sucralfate (CARAFATE) 1 GM/10ML suspension Take 10 mLs (1 g total) by mouth 4 (four) times daily -  with meals and at bedtime. 07/17/16   Greer Pickerel, MD  topiramate (TOPAMAX) 25 MG tablet Take 25 mg by mouth daily. 12/31/15   [provider]    Family History Family History  Problem Relation Age of Onset  . Migraines Mother   . Cancer Maternal Uncle   . Cancer Maternal Uncle   . Cancer Paternal Grandmother   . Cancer Paternal Grandfather   . Colon cancer Other        unknown    Social History Social History   Tobacco Use  . Smoking status: Never Smoker  . Smokeless tobacco: Never Used  Substance Use Topics  . Alcohol use: Yes    Alcohol/week: 0.0 standard drinks    Comment: occ  . Drug use: Not Currently     Allergies   Patient has no known allergies.   Review of Systems Review of Systems    Gastrointestinal: Positive for abdominal pain.  All other systems reviewed and are negative.    Physical Exam Updated Vital Signs BP (!) 134/96 (BP Location: Right Arm)   Pulse 88   Temp 98.6 F (37 C) (Oral)   Resp 18   Ht '5\' 10"'$  (1.778 m)   Wt 90.3 kg   LMP 11/11/2011   SpO2 100%   BMI 28.55 kg/m   Physical Exam Vitals signs and nursing note reviewed.  Constitutional:      Appearance: She is well-developed and normal weight.  HENT:     Head: Normocephalic.  Cardiovascular:     Rate and Rhythm: Normal rate.  Pulmonary:     Effort: Pulmonary effort is normal.     Comments: Surgery over her breast with well-healing surgical scars. Abdominal:     Tenderness: There is abdominal tenderness.    Musculoskeletal:     Comments: Trace edema in the lower extremities.  Skin:    General: Skin is warm.     Capillary Refill: Capillary refill takes less than 2 seconds.  Neurological:     General: No focal deficit present.     Mental Status: She is alert.  Psychiatric:        Mood and Affect: Mood normal.      ED Treatments / Results  Labs (all labs ordered are listed, but only abnormal results are displayed) Labs Reviewed  URINALYSIS, ROUTINE W REFLEX MICROSCOPIC - Abnormal; Notable for the following components:      Result Value   APPearance HAZY (*)    All other components within normal limits    EKG None  Radiology No results found.  Procedures Procedures (including critical care time) INCISION AND DRAINAGE Performed by: Blanchie Dessert Consent: Verbal  consent obtained. Risks and benefits: risks, benefits and alternatives were discussed Type: abdominal seroma   Body area: lower abd  Anesthesia: local infiltration  Incision was made with a scalpel.  Local anesthetic: lidocaine 1% with epinephrine  Anesthetic total: 3 ml  Complexity:simple  Drainage: clear serosanguinous fluid Drainage amount: 134m   Patient tolerance: Patient tolerated  the procedure well with no immediate complications.     Medications Ordered in ED Medications  lidocaine-EPINEPHrine (XYLOCAINE-EPINEPHrine) 1 %-1:200000 (PF) injection 10 mL (10 mLs Intradermal Given by Other 10/05/18 1636)     Initial Impression / Assessment and Plan / ED Course  I have reviewed the triage vital signs and the nursing notes.  Pertinent labs & imaging results that were available during my care of the patient were reviewed by me and considered in my medical decision making (see chart for details).     Patient presenting today due to reaccumulating seroma in the lower abdomen after having liposuction and tummy tuck after skin removal surgery.  [place].  [place].  She states slowly the fluid is just reaccumulated despite her wearing her binder is just becoming more uncomfortable.  She has had no fevers or redness of the area.  On exam there is evidence of seroma but no sign of infection.  Seroma red aspirated with 100 mL's removed.  Patient was placed back in her binder and she will continue to follow with her surgeon remotely and her PCP here.  Question will be whether if this continues to accumulate if she needs to have another drain placed.  Patient will discuss this with her surgeon.  Final Clinical Impressions(s) / ED Diagnoses   Final diagnoses:  Abdominal wall seroma, initial encounter    ED Discharge Orders    None       PBlanchie Dessert MD 10/05/18 1757

## 2018-11-18 ENCOUNTER — Emergency Department (HOSPITAL_BASED_OUTPATIENT_CLINIC_OR_DEPARTMENT_OTHER)
Admission: EM | Admit: 2018-11-18 | Discharge: 2018-11-19 | Disposition: A | Discharge status: Home / Self Care | Payer: BLUE CROSS/BLUE SHIELD | Attending: Emergency Medicine | Admitting: Emergency Medicine

## 2018-11-18 ENCOUNTER — Other Ambulatory Visit: Payer: Self-pay

## 2018-11-18 ENCOUNTER — Encounter (HOSPITAL_BASED_OUTPATIENT_CLINIC_OR_DEPARTMENT_OTHER): Payer: Self-pay

## 2018-11-18 DIAGNOSIS — I10 Essential (primary) hypertension: Secondary | ICD-10-CM | POA: Diagnosis not present

## 2018-11-18 DIAGNOSIS — R52 Pain, unspecified: Secondary | ICD-10-CM | POA: Diagnosis not present

## 2018-11-18 DIAGNOSIS — J45909 Unspecified asthma, uncomplicated: Secondary | ICD-10-CM | POA: Insufficient documentation

## 2018-11-18 DIAGNOSIS — Z9049 Acquired absence of other specified parts of digestive tract: Secondary | ICD-10-CM | POA: Insufficient documentation

## 2018-11-18 DIAGNOSIS — Z79899 Other long term (current) drug therapy: Secondary | ICD-10-CM | POA: Diagnosis not present

## 2018-11-18 DIAGNOSIS — Z9884 Bariatric surgery status: Secondary | ICD-10-CM | POA: Diagnosis not present

## 2018-11-18 DIAGNOSIS — R1907 Generalized intra-abdominal and pelvic swelling, mass and lump: Secondary | ICD-10-CM | POA: Diagnosis present

## 2018-11-18 NOTE — ED Triage Notes (Signed)
Pt c/o abd and bilat flank swelling-hx of "cosmetic surgery" in [place] in December 2019-dx with "seroma" with fluid drainage-seen by PCP today-NAD-slow steady gait

## 2018-11-18 NOTE — ED Provider Notes (Signed)
[facility] DE[facility] Provider Note: Georgena Spurling, MD, FACEP  CSN: [id number] MRN: [id number] ARRIVAL: 11/18/18 at 2042 ROOM: [place]  Abdominal Pain   HISTORY OF PRESENT ILLNESS  11/18/18 11:50 PM [name] [name] is a 43 y.o. female "tummy tuck" in [place] 2 months ago.  She states she developed a postoperative seroma which required the placement of what sounds like Jackson-Pratt drains and she states these drains were removed too early.  She is here with several days of perceived abdominal swelling which she states involves her entire abdomen radiating around her sides to her back.  She thinks she needs drains put back in.  She has had associated nausea but no vomiting.  Her stools have been loose.  She states she has had a fever to 101.  She rates her pain as a 9 out of 10, worse with palpation or movement.  She did have a small seroma (about 100 mL) aspirated in this department on December 31.   Past Medical History:  Diagnosis Date  . Anemia   . Anginal pain ([place])    Pt has chest pains but pt states she was informed is related to acid reflux  . Asthma    pt denies  . Bronchitis   . Chronic abdominal pain   . Chronic bronchitis ([place])   . Chronic constipation   . Chronic lower back pain   . GERD (gastroesophageal reflux disease)   . High cholesterol   . 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 History of hiatal hernia   . History of urinary tract infection   . Hypertension   . Migraine    "3-4 times/month" (07/15/2016)  . Pneumonia ~ 2000 X 1  . PONV (postoperative nausea and vomiting)   . Pre-diabetes   . Pseudotumor cerebri dx'd ealy 2000s  . Seizures ([place])    in childhood / teenage years   . Vision disturbance    "related to PTC"    Past Surgical History:  Procedure Laterality Date  . ABDOMINAL HYSTERECTOMY  "early 2000s"   "partial"  . ABDOMINOPLASTY    . ACHILLES TENDON LENGTHENING Left   . BREAST  ENHANCEMENT SURGERY    . COLONOSCOPY  2008   Dr. Gala Romney: friable anal canal, otherwise normal  . COLONOSCOPY N/A 01/23/2016   LNL:GXQJJHER hemorrhoids  . DILATION AND CURETTAGE OF UTERUS    . ESOPHAGOGASTRODUODENOSCOPY  2009   Dr. Gala Romney: normal   . ESOPHAGOGASTRODUODENOSCOPY N/A 01/23/2016   DEY:CXKGYJ esophagus/small HH  . HERNIA REPAIR    . HIATAL HERNIA REPAIR  06/23/2016   Procedure: LAPAROSCOPIC REPAIR OF HIATAL HERNIA;  Surgeon: Greer Pickerel, MD;  Location: [facility];  Service: General;;  . LAPAROSCOPIC CHOLECYSTECTOMY    . LAPAROSCOPIC GASTRIC SLEEVE RESECTION N/A 06/23/2016   Procedure: LAPAROSCOPIC GASTRIC SLEEVE RESECTION WITH UPPER ENDO;  Surgeon: Greer Pickerel, MD;  Location: [facility];  Service: General;  Laterality: N/A;  . LIPOSUCTION    . LUMBAR PUNCTURE  "several since early 20's"  . vaginal trauma     secondary to fall in childhood / did have to have surgery pt not sure of details    Family History  Problem Relation Age of Onset  . Migraines Mother   . Cancer Maternal Uncle   . Cancer Maternal Uncle   . Cancer Paternal Grandmother   . Cancer Paternal Grandfather   . Colon cancer Other  unknown    Social History   Tobacco Use  . Smoking status: Never Smoker  . Smokeless tobacco: Never Used  Substance Use Topics  . Alcohol use: Yes    Alcohol/week: 0.0 standard drinks    Comment: occ  . Drug use: Not Currently    Prior to Admission medications   Medication Sig Start Date End Date Taking? Authorizing Provider  amLODipine (NORVASC) 5 MG tablet Take 1 tablet (5 mg total) by mouth daily. Patient taking differently: Take 5 mg by mouth. Patient reports not taking since 08/04/2016 01/03/16   Rai, Vernelle Emerald, MD  atorvastatin (LIPITOR) 40 MG tablet Take 1 tablet (40 mg total) by mouth at bedtime. Patient not taking: Reported on 01/07/2018 01/03/16   Rai, Vernelle Emerald, MD  azelastine (OPTIVAR) 0.05 % ophthalmic solution Place 1 drop into both eyes 3 (three) times daily as  needed (ALLERGIES).  01/17/16   [provider]  BIOTIN PO Take 1 tablet by mouth daily.    [provider]  Boric Acid CRYS Place 600 mg vaginally at bedtime. Use vaginally every night for two weeks then twice a week 01/07/18   Constant, Peggy, MD  CALCIUM PO Take 1 tablet by mouth 3 (three) times daily.    [provider]  cyanocobalamin (,VITAMIN B-12,) 1000 MCG/ML injection Inject 1,000 mcg into the muscle every 30 (thirty) days. DUE 08-2016    [provider]  dexlansoprazole (DEXILANT) 60 MG capsule Take 1 capsule (60 mg total) by mouth daily. 05/22/16   Annitta Needs, NP  fluticasone (FLONASE) 50 MCG/ACT nasal spray Place 2 sprays into both nostrils daily.    [provider]  levocetirizine (XYZAL) 5 MG tablet Take 5 mg by mouth daily.    [provider]  lidocaine (LIDODERM) 5 % Place 1 patch onto the skin daily. Remove & Discard patch within 12 hours or as directed by MD 02/02/17   Antonietta Breach, PA-C  methocarbamol (ROBAXIN) 500 MG tablet Take 1 tablet (500 mg total) by mouth every 8 (eight) hours as needed for muscle spasms. 02/02/17   Antonietta Breach, PA-C  montelukast (SINGULAIR) 10 MG tablet Take 10 mg by mouth daily.    [provider]  Multiple Vitamin (MULTIVITAMIN WITH MINERALS) TABS tablet Take 1 tablet by mouth daily.    [provider]  ondansetron (ZOFRAN-ODT) 4 MG disintegrating tablet Take 1 tablet (4 mg total) by mouth every 6 (six) hours as needed for nausea. 07/17/16   Greer Pickerel, MD  oxyCODONE-acetaminophen (PERCOCET/ROXICET) 5-325 MG tablet Take 1-2 tablets by mouth every 6 (six) hours as needed for severe pain. 02/02/17   Antonietta Breach, PA-C  pantoprazole (PROTONIX) 40 MG tablet Take 40 mg by mouth 2 (two) times daily.    [provider]  predniSONE (DELTASONE) 20 MG tablet Take 2 tablets (40 mg total) by mouth daily. Take 40 mg by mouth daily for 3 days, then '20mg'$  by mouth daily for 3 days, then '10mg'$   daily for 3 days 02/02/17   Antonietta Breach, PA-C  sucralfate (CARAFATE) 1 GM/10ML suspension Take 10 mLs (1 g total) by mouth 4 (four) times daily -  with meals and at bedtime. 07/17/16   Greer Pickerel, MD  topiramate (TOPAMAX) 25 MG tablet Take 25 mg by mouth daily. 12/31/15   [provider]    Allergies Patient has no known allergies.   REVIEW OF SYSTEMS  Negative except as noted here or in the History of Present Illness.  PHYSICAL EXAMINATION  Initial Vital Signs Blood pressure (!) 142/101, pulse 75, temperature 98.3 F (36.8 C), temperature source Oral, resp. rate 20, height '5\' 10"'$  (1.778 m), weight 94.3 kg, last menstrual period 11/11/2011, SpO2 100 %.  Examination General: Well-developed, well-nourished female in no acute distress; appearance consistent with age of record HENT: normocephalic; atraumatic Eyes: pupils equal, round and reactive to light; extraocular muscles intact Neck: supple Heart: regular rate and rhythm Lungs: clear to auscultation bilaterally Abdomen: soft; no appreciable fluid collection or mass; diffuse tenderness; bowel sounds present Extremities: No deformity; full range of motion; pulses normal Neurologic: Awake, alert and oriented; motor function intact in all extremities and symmetric; no facial droop Skin: Warm and dry Psychiatric: Normal mood and affect   RESULTS  Summary of this visit's results, reviewed by myself:   EKG Interpretation  Date/Time:    Ventricular Rate:    PR Interval:    QRS Duration:   QT Interval:    QTC Calculation:   R Axis:     Text Interpretation:        Laboratory Studies: Results for orders placed or performed during the hospital encounter of 11/18/18 (from the past 24 hour(s))  CBC with Differential/Platelet     Status: None   Collection Time: 11/19/18 12:20 AM  Result Value Ref Range   WBC 5.7 4.0 - 10.5 K/uL   RBC 4.30 3.87 - 5.11 MIL/uL   Hemoglobin 12.2 12.0 - 15.0 g/dL   HCT 38.3 36.0 - 46.0  %   MCV 89.1 80.0 - 100.0 fL   MCH 28.4 26.0 - 34.0 pg   MCHC 31.9 30.0 - 36.0 g/dL   RDW 14.0 11.5 - 15.5 %   Platelets 333 150 - 400 K/uL   nRBC 0.0 0.0 - 0.2 %   Neutrophils Relative % 43 %   Neutro Abs 2.4 1.7 - 7.7 K/uL   Lymphocytes Relative 44 %   Lymphs Abs 2.6 0.7 - 4.0 K/uL   Monocytes Relative 8 %   Monocytes Absolute 0.5 0.1 - 1.0 K/uL   Eosinophils Relative 4 %   Eosinophils Absolute 0.2 0.0 - 0.5 K/uL   Basophils Relative 1 %   Basophils Absolute 0.1 0.0 - 0.1 K/uL   Immature Granulocytes 0 %   Abs Immature Granulocytes 0.02 0.00 - 0.07 K/uL  Comprehensive metabolic panel     Status: Abnormal   Collection Time: 11/19/18 12:20 AM  Result Value Ref Range   Sodium 138 135 - 145 mmol/L   Potassium 3.5 3.5 - 5.1 mmol/L   Chloride 111 98 - 111 mmol/L   CO2 19 (L) 22 - 32 mmol/L   Glucose, Bld 82 70 - 99 mg/dL   BUN 14 6 - 20 mg/dL   Creatinine, Ser 0.77 0.44 - 1.00 mg/dL   Calcium 8.6 (L) 8.9 - 10.3 mg/dL   Total Protein 7.7 6.5 - 8.1 g/dL   Albumin 4.1 3.5 - 5.0 g/dL   AST 19 15 - 41 U/L   ALT 15 0 - 44 U/L   Alkaline Phosphatase 45 38 - 126 U/L   Total Bilirubin 0.4 0.3 - 1.2 mg/dL   GFR calc non Af Amer >60 >60 mL/min   GFR calc Af Amer >60 >60 mL/min   Anion gap 8 5 - 15  Lipase, blood     Status: None   Collection Time: 11/19/18 12:20 AM  Result Value Ref Range   Lipase 51 11 - 51 U/L   Imaging  Studies: Ct Abdomen Pelvis W Contrast  Result Date: 11/19/2018 CLINICAL DATA:  Abdominal pain for 2 days EXAM: CT ABDOMEN AND PELVIS WITH CONTRAST TECHNIQUE: Multidetector CT imaging of the abdomen and pelvis was performed using the standard protocol following bolus administration of intravenous contrast. CONTRAST:  114m ISOVUE-300 IOPAMIDOL (ISOVUE-300) INJECTION 61% COMPARISON:  None. FINDINGS: Lower chest: Lung bases are free of acute infiltrate or sizable effusion. Bilateral breast implants are noted. Hepatobiliary: No focal liver abnormality is seen. Status  post cholecystectomy. No biliary dilatation. Pancreas: Unremarkable. No pancreatic ductal dilatation or surrounding inflammatory changes. Spleen: Normal in size without focal abnormality. Adrenals/Urinary Tract: Adrenal glands are within normal limits. Kidneys are well visualize without renal calculi or obstructive changes. The bladder is partially distended. Stomach/Bowel: No obstructive or inflammatory changes are noted. Prior gastric sleeve surgery is noted consistent with the given clinical history. The appendix is not well visualized and likely surgically removed. No inflammatory changes are identified. Vascular/Lymphatic: No significant vascular findings are present. No enlarged abdominal or pelvic lymph nodes. Multiple phleboliths are noted within the pelvis. Reproductive: Uterus has been surgically removed. No adnexal mass is noted. Other: Postsurgical changes are noted in the anterior abdominal wall consistent with the recent cosmetic surgery. Mild inflammatory changes are noted although no definitive postoperative fluid collection is seen. Similar changes are noted in the flanks and buttocks bilaterally also related to the recent surgery. Musculoskeletal: No acute or significant osseous findings. IMPRESSION: Changes consistent with the recent cosmetic surgery. Some inflammatory postsurgical changes are seen although no abscess or seroma is noted. Chronic changes as described without acute abnormality. Electronically Signed   By: MInez CatalinaM.D.   On: 11/19/2018 01:29    ED COURSE and MDM  Nursing notes and initial vitals signs, including pulse oximetry, reviewed.  Vitals:   11/18/18 2059 11/18/18 2328  BP: 134/83 (!) 142/101  Pulse: 85 75  Resp: 20 20  Temp: 98.5 F (36.9 C) 98.3 F (36.8 C)  TempSrc: Oral Oral  SpO2: 100% 100%  Weight: 94.3 kg   Height: '5\' 10"'$  (1.778 m)    1:56 AM Patient advised of CT changes showing some postsurgical inflammation but no seroma or abscess.  Her  abdomen is not warm or erythematous to suggest a cellulitis.  She was advised to contact her surgeon if symptoms persist or worsen.  I do not believe any antibiotics are indicated at this time.  PROCEDURES    ED DIAGNOSES     ICD-10-CM   1. Acute pain associated with inflammation R52        Jayshun Galentine, MD 11/19/18 0157

## 2018-11-19 ENCOUNTER — Emergency Department (HOSPITAL_BASED_OUTPATIENT_CLINIC_OR_DEPARTMENT_OTHER): Payer: BLUE CROSS/BLUE SHIELD

## 2018-11-19 LAB — CBC WITH DIFFERENTIAL/PLATELET
ABS IMMATURE GRANULOCYTES: 0.02 10*3/uL (ref 0.00–0.07)
Basophils Absolute: 0.1 10*3/uL (ref 0.0–0.1)
Basophils Relative: 1 %
EOS PCT: 4 %
Eosinophils Absolute: 0.2 10*3/uL (ref 0.0–0.5)
HCT: 38.3 % (ref 36.0–46.0)
HEMOGLOBIN: 12.2 g/dL (ref 12.0–15.0)
Immature Granulocytes: 0 %
LYMPHS PCT: 44 %
Lymphs Abs: 2.6 10*3/uL (ref 0.7–4.0)
MCH: 28.4 pg (ref 26.0–34.0)
MCHC: 31.9 g/dL (ref 30.0–36.0)
MCV: 89.1 fL (ref 80.0–100.0)
Monocytes Absolute: 0.5 10*3/uL (ref 0.1–1.0)
Monocytes Relative: 8 %
Neutro Abs: 2.4 10*3/uL (ref 1.7–7.7)
Neutrophils Relative %: 43 %
Platelets: 333 10*3/uL (ref 150–400)
RBC: 4.3 MIL/uL (ref 3.87–5.11)
RDW: 14 % (ref 11.5–15.5)
WBC: 5.7 10*3/uL (ref 4.0–10.5)
nRBC: 0 % (ref 0.0–0.2)

## 2018-11-19 LAB — COMPREHENSIVE METABOLIC PANEL
ALBUMIN: 4.1 g/dL (ref 3.5–5.0)
ALT: 15 U/L (ref 0–44)
AST: 19 U/L (ref 15–41)
Alkaline Phosphatase: 45 U/L (ref 38–126)
Anion gap: 8 (ref 5–15)
BUN: 14 mg/dL (ref 6–20)
CO2: 19 mmol/L — ABNORMAL LOW (ref 22–32)
CREATININE: 0.77 mg/dL (ref 0.44–1.00)
Calcium: 8.6 mg/dL — ABNORMAL LOW (ref 8.9–10.3)
Chloride: 111 mmol/L (ref 98–111)
GFR calc Af Amer: >60 mL/min (ref >60)
GFR calc non Af Amer: >60 mL/min (ref >60)
GLUCOSE: 82 mg/dL (ref 70–99)
Potassium: 3.5 mmol/L (ref 3.5–5.1)
Sodium: 138 mmol/L (ref 135–145)
Total Bilirubin: 0.4 mg/dL (ref 0.3–1.2)
Total Protein: 7.7 g/dL (ref 6.5–8.1)

## 2018-11-19 LAB — LIPASE, BLOOD: Lipase: 51 U/L (ref 11–51)

## 2018-11-19 MED ORDER — IOPAMIDOL (ISOVUE-300) INJECTION 61%
100.0000 mL | Freq: Once | INTRAVENOUS | Status: AC | PRN
  Administered 2018-11-19: 100 mL via INTRAVENOUS

## 2018-11-19 MED ORDER — ONDANSETRON HCL 4 MG/2ML IJ SOLN
4.0000 mg | Freq: Once | INTRAMUSCULAR | Status: AC
  Administered 2018-11-19: 4 mg via INTRAVENOUS
  Filled 2018-11-19: qty 2

## 2018-11-19 MED ORDER — FENTANYL CITRATE (PF) 100 MCG/2ML IJ SOLN
100.0000 ug | Freq: Once | INTRAMUSCULAR | Status: AC
  Administered 2018-11-19: 100 ug via INTRAVENOUS
  Filled 2018-11-19: qty 2

## 2018-11-19 NOTE — ED Notes (Signed)
Patient transported to CT 

## 2018-11-19 NOTE — ED Notes (Signed)
Pt returned from ct

## 2019-03-09 ENCOUNTER — Other Ambulatory Visit (HOSPITAL_COMMUNITY): Payer: Self-pay | Admitting: Family Medicine

## 2019-03-09 DIAGNOSIS — Z1231 Encounter for screening mammogram for malignant neoplasm of breast: Secondary | ICD-10-CM

## 2021-03-04 ENCOUNTER — Encounter (HOSPITAL_BASED_OUTPATIENT_CLINIC_OR_DEPARTMENT_OTHER): Payer: Self-pay | Admitting: Emergency Medicine

## 2021-03-04 ENCOUNTER — Emergency Department (HOSPITAL_BASED_OUTPATIENT_CLINIC_OR_DEPARTMENT_OTHER)
Admission: EM | Admit: 2021-03-04 | Discharge: 2021-03-04 | Disposition: A | Discharge status: Home / Self Care | Payer: PRIVATE HEALTH INSURANCE | Attending: Emergency Medicine | Admitting: Emergency Medicine

## 2021-03-04 ENCOUNTER — Other Ambulatory Visit: Payer: Self-pay

## 2021-03-04 DIAGNOSIS — J45909 Unspecified asthma, uncomplicated: Secondary | ICD-10-CM | POA: Diagnosis not present

## 2021-03-04 DIAGNOSIS — I10 Essential (primary) hypertension: Secondary | ICD-10-CM | POA: Diagnosis not present

## 2021-03-04 DIAGNOSIS — R55 Syncope and collapse: Secondary | ICD-10-CM | POA: Diagnosis not present

## 2021-03-04 DIAGNOSIS — Z79899 Other long term (current) drug therapy: Secondary | ICD-10-CM | POA: Insufficient documentation

## 2021-03-04 DIAGNOSIS — Z7951 Long term (current) use of inhaled steroids: Secondary | ICD-10-CM | POA: Insufficient documentation

## 2021-03-04 LAB — CBC WITH DIFFERENTIAL/PLATELET
Abs Immature Granulocytes: 0.01 10*3/uL (ref 0.00–0.07)
Basophils Absolute: 0 10*3/uL (ref 0.0–0.1)
Basophils Relative: 0 %
Eosinophils Absolute: 0.1 10*3/uL (ref 0.0–0.5)
Eosinophils Relative: 2 %
HCT: 33.2 % — ABNORMAL LOW (ref 36.0–46.0)
Hemoglobin: 11 g/dL — ABNORMAL LOW (ref 12.0–15.0)
Immature Granulocytes: 0 %
Lymphocytes Relative: 39 %
Lymphs Abs: 2.8 10*3/uL (ref 0.7–4.0)
MCH: 28.4 pg (ref 26.0–34.0)
MCHC: 33.1 g/dL (ref 30.0–36.0)
MCV: 85.8 fL (ref 80.0–100.0)
Monocytes Absolute: 0.6 10*3/uL (ref 0.1–1.0)
Monocytes Relative: 8 %
Neutro Abs: 3.7 10*3/uL (ref 1.7–7.7)
Neutrophils Relative %: 51 %
Platelets: 355 10*3/uL (ref 150–400)
RBC: 3.87 MIL/uL (ref 3.87–5.11)
RDW: 15.1 % (ref 11.5–15.5)
WBC: 7.2 10*3/uL (ref 4.0–10.5)
nRBC: 0 % (ref 0.0–0.2)

## 2021-03-04 LAB — BASIC METABOLIC PANEL
Anion gap: 6 (ref 5–15)
BUN: 19 mg/dL (ref 6–20)
CO2: 20 mmol/L — ABNORMAL LOW (ref 22–32)
Calcium: 8.5 mg/dL — ABNORMAL LOW (ref 8.9–10.3)
Chloride: 113 mmol/L — ABNORMAL HIGH (ref 98–111)
Creatinine, Ser: 0.81 mg/dL (ref 0.44–1.00)
GFR, Estimated: >60 mL/min (ref >60)
Glucose, Bld: 98 mg/dL (ref 70–99)
Potassium: 3.2 mmol/L — ABNORMAL LOW (ref 3.5–5.1)
Sodium: 139 mmol/L (ref 135–145)

## 2021-03-04 LAB — RAPID URINE DRUG SCREEN, HOSP PERFORMED
Amphetamines: NOT DETECTED
Barbiturates: NOT DETECTED
Benzodiazepines: NOT DETECTED
Cocaine: NOT DETECTED
Opiates: NOT DETECTED
Tetrahydrocannabinol: POSITIVE — AB

## 2021-03-04 MED ORDER — POTASSIUM CHLORIDE CRYS ER 20 MEQ PO TBCR
40.0000 meq | EXTENDED_RELEASE_TABLET | Freq: Once | ORAL | Status: AC
  Administered 2021-03-04: 40 meq via ORAL
  Filled 2021-03-04: qty 2

## 2021-03-04 MED ORDER — SODIUM CHLORIDE 0.9 % IV BOLUS
1000.0000 mL | Freq: Once | INTRAVENOUS | Status: AC
  Administered 2021-03-04: 1000 mL via INTRAVENOUS

## 2021-03-04 NOTE — ED Provider Notes (Signed)
[facility] Provider Note   CSN: [id number] Arrival date & time: 03/04/21  1911     History Chief Complaint  Patient presents with  . Near Syncope    [name] is a 45 y.o. female past medical history of hypertension, laparoscopic sleeve gastrectomy, chronic back and abdominal pain, presenting for evaluation of syncope.  Patient states she was at a club last night.  She was drinking alcohol, she was only on her second drink when she began losing her hearing and then vision and then syncopized.  Does not know duration though she did not fall to the floor, her friends lowered her to a chair.  She states she has been feeling faint with position changes throughout the day today.  This is mostly when she goes from sitting to standing.  She is not having any palpitations, chest pain, shortness of breath.  She does note history of gastric sleeve and does not have a large p.o. intake, is unsure if she has been hydrating properly.  She denies any drug use.  She worries that her drink had some type of drug put in it.  No recent illness, no fever, no vomiting or diarrhea, no UTI symptoms.  The history is provided by the patient.       Past Medical History:  Diagnosis Date  . Anemia   . Anginal pain ([place])    Pt has chest pains but pt states she was informed is related to acid reflux  . Asthma    pt denies  . Bronchitis   . Chronic abdominal pain   . Chronic bronchitis ([place])   . Chronic constipation   . Chronic lower back pain   . GERD (gastroesophageal reflux disease)   . High cholesterol   . 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 History of hiatal hernia   . History of urinary tract infection   . Hypertension   . Migraine    "3-4 times/month" (07/15/2016)  . Pneumonia ~ 2000 X 1  . PONV (postoperative nausea and vomiting)   . Pre-diabetes   . Pseudotumor cerebri dx'd ealy 2000s  . Seizures ([place])    in  childhood / teenage years   . Vision disturbance    "related to [facility]"    Patient Active Problem List   Diagnosis Date Noted  . Vomiting 07/15/2016  . Dehydration 07/10/2016  . Prediabetes 06/23/2016  . Hiatal hernia 06/23/2016  . Hypercholesterolemia with hypertriglyceridemia 06/23/2016  . 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 IIH (idiopathic intracranial hypertension) 03/03/2016  . Second degree hemorrhoids   . Headache 01/05/2016  . Chest pain 01/01/2016  . Pseudotumor cerebri 01/01/2016  . HTN (hypertension) 01/01/2016  . Obesity (BMI 30-39.9) 01/01/2016  . Asthma 01/01/2016  . GERD (gastroesophageal reflux disease) 01/01/2016  . Chest pain syndrome 01/01/2016  . Rectal bleeding 12/26/2015  . Chest discomfort 12/26/2015  . Frequent headaches 12/26/2015  . Pain in joint, lower leg 04/11/2014  . Stiffness of joint, not elsewhere classified, pelvic region and thigh 04/11/2014  . Difficulty in walking(719.7) 04/11/2014  . 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 Rhabdomyolysis 06/27/2013  . Hypokalemia 06/27/2013  . Bruising 06/27/2013  . ACHILLES TENDON RUPTURE 05/29/2008    Past Surgical History:  Procedure Laterality Date  . ABDOMINAL HYSTERECTOMY  "early 2000s"   "partial"  . ABDOMINOPLASTY    . ACHILLES TENDON LENGTHENING Left   . BREAST ENHANCEMENT  SURGERY    . COLONOSCOPY  2008   Dr. Gala Romney: friable anal canal, otherwise normal  . COLONOSCOPY N/A 01/23/2016   OVZ:CHYIFOYD hemorrhoids  . DILATION AND CURETTAGE OF UTERUS    . ESOPHAGOGASTRODUODENOSCOPY  2009   Dr. Gala Romney: normal   . ESOPHAGOGASTRODUODENOSCOPY N/A 01/23/2016   XAJ:OINOMV esophagus/small HH  . HERNIA REPAIR    . HIATAL HERNIA REPAIR  06/23/2016   Procedure: LAPAROSCOPIC REPAIR OF HIATAL HERNIA;  Surgeon: Greer Pickerel, MD;  Location: [facility];  Service: General;;  . LAPAROSCOPIC CHOLECYSTECTOMY    . LAPAROSCOPIC GASTRIC SLEEVE RESECTION N/A 06/23/2016   Procedure: LAPAROSCOPIC GASTRIC  SLEEVE RESECTION WITH UPPER ENDO;  Surgeon: Greer Pickerel, MD;  Location: [facility];  Service: General;  Laterality: N/A;  . LIPOSUCTION    . LUMBAR PUNCTURE  "several since early 20's"  . vaginal trauma     secondary to fall in childhood / did have to have surgery pt not sure of details     OB History    Gravida  3   Para  3   Term  3   Preterm      AB      Living  3     SAB      IAB      Ectopic      Multiple      Live Births              Family History  Problem Relation Age of Onset  . Migraines Mother   . Cancer Maternal Uncle   . Cancer Maternal Uncle   . Cancer Paternal Grandmother   . Cancer Paternal Grandfather   . Colon cancer Other        unknown    Social History   Tobacco Use  . Smoking status: Never Smoker  . Smokeless tobacco: Never Used  Vaping Use  . Vaping Use: Never used  Substance Use Topics  . Alcohol use: Yes    Alcohol/week: 0.0 standard drinks    Comment: occ    Home Medications Prior to Admission medications   Medication Sig Start Date End Date Taking? Authorizing Provider  amLODipine (NORVASC) 5 MG tablet Take 1 tablet (5 mg total) by mouth daily. Patient taking differently: Take 5 mg by mouth. Patient reports not taking since 08/04/2016 01/03/16   Rai, Vernelle Emerald, MD  azelastine (OPTIVAR) 0.05 % ophthalmic solution Place 1 drop into both eyes 3 (three) times daily as needed (ALLERGIES).  01/17/16   [provider]  BIOTIN PO Take 1 tablet by mouth daily.    [provider]  Boric Acid CRYS Place 600 mg vaginally at bedtime. Use vaginally every night for two weeks then twice a week 01/07/18   Constant, Peggy, MD  CALCIUM PO Take 1 tablet by mouth 3 (three) times daily.    [provider]  cyanocobalamin (,VITAMIN B-12,) 1000 MCG/ML injection Inject 1,000 mcg into the muscle every 30 (thirty) days. DUE 08-2016    [provider]  fluticasone (FLONASE) 50 MCG/ACT nasal spray Place 2 sprays into  both nostrils daily.    [provider]  levocetirizine (XYZAL) 5 MG tablet Take 5 mg by mouth daily.    [provider]  montelukast (SINGULAIR) 10 MG tablet Take 10 mg by mouth daily.    [provider]  Multiple Vitamin (MULTIVITAMIN WITH MINERALS) TABS tablet Take 1 tablet by mouth daily.    [provider]  pantoprazole (PROTONIX) 40 MG  tablet Take 40 mg by mouth 2 (two) times daily.    [provider]  topiramate (TOPAMAX) 25 MG tablet Take 25 mg by mouth daily. 12/31/15   [provider]    Allergies    Patient has no known allergies.  Review of Systems   Review of Systems  Neurological: Positive for syncope and light-headedness.  All other systems reviewed and are negative.   Physical Exam Updated Vital Signs BP 127/84   Pulse 64   Temp 98.4 F (36.9 C) (Oral)   Resp 18   Ht $R'5\' 10"'eR$  (1.778 m)   Wt 94.3 kg   LMP 11/11/2011   SpO2 100%   BMI 29.83 kg/m   Physical Exam Vitals and nursing note reviewed.  Constitutional:      General: She is not in acute distress.    Appearance: She is well-developed. She is not ill-appearing.  HENT:     Head: Normocephalic and atraumatic.  Eyes:     Extraocular Movements: Extraocular movements intact.     Conjunctiva/sclera: Conjunctivae normal.     Pupils: Pupils are equal, round, and reactive to light.  Cardiovascular:     Rate and Rhythm: Normal rate and regular rhythm.     Pulses: Normal pulses.     Heart sounds: Normal heart sounds.  Pulmonary:     Effort: Pulmonary effort is normal. No respiratory distress.     Breath sounds: Normal breath sounds.  Abdominal:     General: Bowel sounds are normal.     Palpations: Abdomen is soft.     Tenderness: There is no abdominal tenderness.  Musculoskeletal:     Right lower leg: No edema.     Left lower leg: No edema.  Skin:    General: Skin is warm.  Neurological:     Mental Status: She is alert.  Psychiatric:         Behavior: Behavior normal.     ED Results / Procedures / Treatments   Labs (all labs ordered are listed, but only abnormal results are displayed) Labs Reviewed  BASIC METABOLIC PANEL - Abnormal; Notable for the following components:      Result Value   Potassium 3.2 (*)    Chloride 113 (*)    CO2 20 (*)    Calcium 8.5 (*)    All other components within normal limits  CBC WITH DIFFERENTIAL/PLATELET - Abnormal; Notable for the following components:   Hemoglobin 11.0 (*)    HCT 33.2 (*)    All other components within normal limits  RAPID URINE DRUG SCREEN, HOSP PERFORMED - Abnormal; Notable for the following components:   Tetrahydrocannabinol POSITIVE (*)    All other components within normal limits    EKG EKG Interpretation  Date/Time:  Monday Mar 04 2021 19:26:21 EDT Ventricular Rate:  72 PR Interval:  171 QRS Duration: 98 QT Interval:  415 QTC Calculation: 455 R Axis:   -27 Text Interpretation: Sinus rhythm Borderline left axis deviation Confirmed by Octaviano Glow [phone]) on 03/04/2021 7:28:22 PM   Radiology No results found.  Procedures Procedures   Medications Ordered in ED Medications  sodium chloride 0.9 % bolus 1,000 mL ( Intravenous Stopped 03/04/21 2126)  potassium chloride SA (KLOR-CON) CR tablet 40 mEq (40 mEq Oral Given 03/04/21 2236)    ED Course  I have reviewed the triage vital signs and the nursing notes.  Pertinent labs & imaging results that were available during my care of the patient were reviewed by me  and considered in my medical decision making (see chart for details).  Clinical Course as of 03/04/21 2341  Mon Mar 04, 2021  2251 Discussed lab results with patient.  She is feeling improvement after IV fluids and ambulating without recurrent lightheadedness.  She is not orthostatic.  Recommend close PCP follow-up and strict return precautions.  She is in agreement with this plan. [JR]    Clinical Course User Index [JR] Kadan Millstein, [place] N,  PA-C   MDM Rules/Calculators/A&P                          Patient is a 45 year old female presenting for evaluation of syncope that occurred last night while she was drinking alcohol at a club.  There is concerned that there was some drug put in her drink when she had only had 1.5 beverages at the time.  She has been feeling near syncopal throughout the day today with standing and fatigue.  Heart lung sounds are clear.  No arrhythmia on EKG.  She denies any chest pain or shortness of breath to suggest cardiac or pulmonary etiology.  She is not hypoxic or tachypneic.  She is given IV fluids for symptoms.  Blood work ordered.  CBC with very minimal anemia with hemoglobin of 11.  Metabolic panel without acute significant abnormalities.  She does have mild hypokalemia, this is replaced orally.  Mildly low bicarb and a slightly elevated chloride, though this appears to be consistent with her previous blood draws/baseline.  UDS is positive for cannabis, she admits to using marijuana.  She is not pregnant (s/p hysterectomy).  Patient with improvement in symptoms after IV fluids, ambulating around the ED without recurrent lightheadedness.  Her orthostatics are negative.  Discussed importance of oral hydration, and close PCP follow-up.  She is in agreement with care plan for discharge at this time.  She is instructed to return should symptoms worsen.  Discussed results, findings, treatment and follow up. Patient advised of return precautions. Patient verbalized understanding and agreed with plan.  Final Clinical Impression(s) / ED Diagnoses Final diagnoses:  Syncope and collapse    Rx / DC Orders ED Discharge Orders    None       Azelyn Batie, [place] N, PA-C 03/04/21 2341    Wyvonnia Dusky, MD 03/05/21 [phone]

## 2021-03-04 NOTE — Discharge Instructions (Signed)
It is important you stay hydrated and eat a well-balanced diet. Follow closely with your primary care provider. Return to the ER if you get recurrent worsening symptoms, chest pain, persistent palpitations, severe shortness of breath.

## 2021-03-04 NOTE — ED Triage Notes (Signed)
Patient presents with complaints of syncopal episode last pm. Denies fall. States ETOH use last pm.

## 2021-03-04 NOTE — ED Notes (Signed)
Generalized weakness present

## 2021-03-06 ENCOUNTER — Other Ambulatory Visit: Payer: Self-pay | Admitting: Family Medicine

## 2021-03-06 DIAGNOSIS — Z1231 Encounter for screening mammogram for malignant neoplasm of breast: Secondary | ICD-10-CM

## 2021-03-25 ENCOUNTER — Other Ambulatory Visit: Payer: Self-pay | Admitting: Family Medicine

## 2021-05-06 ENCOUNTER — Other Ambulatory Visit: Payer: Self-pay | Admitting: Family Medicine

## 2021-05-06 DIAGNOSIS — R350 Frequency of micturition: Secondary | ICD-10-CM

## 2021-05-06 DIAGNOSIS — R1030 Lower abdominal pain, unspecified: Secondary | ICD-10-CM

## 2021-05-06 DIAGNOSIS — R110 Nausea: Secondary | ICD-10-CM

## 2021-05-08 ENCOUNTER — Ambulatory Visit (INDEPENDENT_AMBULATORY_CARE_PROVIDER_SITE_OTHER): Payer: PRIVATE HEALTH INSURANCE

## 2021-05-08 ENCOUNTER — Encounter: Payer: Self-pay | Admitting: Orthopaedic Surgery

## 2021-05-08 ENCOUNTER — Ambulatory Visit (INDEPENDENT_AMBULATORY_CARE_PROVIDER_SITE_OTHER): Payer: PRIVATE HEALTH INSURANCE | Admitting: Orthopaedic Surgery

## 2021-05-08 ENCOUNTER — Other Ambulatory Visit: Payer: Self-pay

## 2021-05-08 DIAGNOSIS — M545 Low back pain, unspecified: Secondary | ICD-10-CM

## 2021-05-08 DIAGNOSIS — G8929 Other chronic pain: Secondary | ICD-10-CM

## 2021-05-08 DIAGNOSIS — M25561 Pain in right knee: Secondary | ICD-10-CM | POA: Insufficient documentation

## 2021-05-08 HISTORY — DX: Low back pain, unspecified: M54.50

## 2021-05-08 MED ORDER — METHYLPREDNISOLONE ACETATE 40 MG/ML IJ SUSP
80.0000 mg | INTRAMUSCULAR | Status: AC | PRN
  Administered 2021-05-08: 80 mg via INTRA_ARTICULAR

## 2021-05-08 MED ORDER — LIDOCAINE HCL 1 % IJ SOLN
2.0000 mL | INTRAMUSCULAR | Status: AC | PRN
  Administered 2021-05-08: 2 mL

## 2021-05-08 MED ORDER — BUPIVACAINE HCL 0.25 % IJ SOLN
2.0000 mL | INTRAMUSCULAR | Status: AC | PRN
  Administered 2021-05-08: 2 mL via INTRA_ARTICULAR

## 2021-05-08 NOTE — Progress Notes (Signed)
Office Visit Note   Patient: [name]           Date of Birth: [birth year]           MRN: [id number] Visit Date: 05/08/2021              Requested by: No referring provider defined for this encounter. PCP: Pcp, No   Assessment & Plan: Visit Diagnoses:  1. Chronic left-sided low back pain, unspecified whether sciatica present   2. Acute pain of right knee   3. Chronic bilateral low back pain without sciatica     Plan: [name] has a chronic history of "off-and-on" low back pain.  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  She has been evaluated in the past but without diagnostic testing.  Today she had films demonstrating some narrowing of the L5-S1 disc space consistent with osteoarthritis.  She does not appear to have any radiculopathy.  Long discussion regarding what she can expect over time and to continue working with exercises.  She does visit the chiropractor on occasion which is fine.  Also discussed use of anti-inflammatory medicines.  We will plan to see her back as needed.  Having more trouble with her right knee with recent onset without injury or trauma.  She has had some trouble with deep knee bends and squats with both of her knees and has evidence of chondromalacia patella.  Films of her right knee demonstrate some mild degenerative changes.  Today she is having a bit more trouble on the medial compartment and about the patellofemoral joint without effusion.  I injected the knee with cortisone and will monitor her response.  She is celebrating her birthday with a 2-week vacation and wants to be able to dance.  Follow-Up Instructions: Return if symptoms worsen or fail to improve.   Orders:  Orders Placed This Encounter  Procedures   XR KNEE 3 VIEW RIGHT   XR Lumbar Spine 2-3 Views   No orders of the defined types were placed in this encounter.     Procedures: Large Joint Inj: R knee on 05/08/2021 11:01  AM Indications: pain and diagnostic evaluation Details: 25 G 1.5 in needle, anteromedial approach  Arthrogram: No  Medications: 2 mL lidocaine 1 %; 80 mg methylPREDNISolone acetate 40 MG/ML; 2 mL bupivacaine 0.25 % Procedure, treatment alternatives, risks and benefits explained, specific risks discussed. Consent was given by the patient. Immediately prior to procedure a time out was called to verify the correct patient, procedure, equipment, support staff and site/side marked as required. Patient was prepped and draped in the usual sterile fashion.      Clinical Data: No additional findings.   Subjective: Chief Complaint  Patient presents with   Right Knee - Pain   Lower Back - Pain  Patient presents today for her right knee and lower back pain. She said that her right knee started to hurt medially three days ago. She is now aware of any injury, but states that she may have bumped it at work. She is a Administrator. She said that she cannot sleep. She cannot squat down. She has a big trip coming up for her birthday with lots of excursions planned. She is concerned she will not be able to do those things. She also has left sided lower back pain that has been present for quite some time on and off. She does to a  chriopractor. She does not take anything for pain.  Past history is positive for gastric sleeve procedure nearly 10 years ago she notes that her weight has been stable at about 207 pounds.  She is not diabetic  HPI  Review of Systems   Objective: Vital Signs: LMP 11/11/2011   Physical Exam Constitutional:      Appearance: She is well-developed.  Eyes:     Pupils: Pupils are equal, round, and reactive to light.  Pulmonary:     Effort: Pulmonary effort is normal.  Skin:    General: Skin is warm and dry.  Neurological:     Mental Status: She is alert and oriented to person, place, and time.  Psychiatric:        Behavior: Behavior normal.    Ortho Exam awake alert and  oriented x3.  Comfortable sitting and walking.  No limp.  Right knee was not hot red warm or swollen.  No effusion.  There was positive patella crepitation but no pain with compression.  Mild to moderate diffuse medial joint pain without popping or clicking.  Little discomfort laterally.  Full extension flexed over 105 degrees without instability.  No popliteal pain or mass.  No calf pain.  Straight leg raise negative bilaterally.  Painless range of motion of both hips.  Motor and sensory exam intact both lower extremities.  Very minimal percussible tenderness of lumbar spine Specialty Comments:  No specialty comments available.  Imaging: XR KNEE 3 VIEW RIGHT  Result Date: 05/08/2021 Films of the right knee are obtained in 3 projections standing.  There might be a very small peripheral osteophyte laterally of the femur but the joint space was maintained.  Slight increased valgus.  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  There is a lateral patella tilt with some narrowing of the lateral patella femoral articulation.  Films are consistent with mild osteoarthritis  XR Lumbar Spine 2-3 Views  Result Date: 05/08/2021 Films of the lumbar spine obtained in 2 projections.  There is some narrowing of the L5-S1 disc space consistent with degenerative disc disease.  There is some mild facet sclerosis at the same level.  No evidence of scoliosis or listhesis.    PMFS History: Patient Active Problem List   Diagnosis Date Noted   Pain in right knee 05/08/2021   Low back pain 05/08/2021   Vomiting 07/15/2016   Dehydration 07/10/2016   Prediabetes 06/23/2016   Hiatal hernia 06/23/2016   Hypercholesterolemia with hypertriglyceridemia 06/23/2016   S/P laparoscopic sleeve gastrectomy 06/23/2016   IIH (idiopathic intracranial hypertension) 03/03/2016   Second degree hemorrhoids    Headache 01/05/2016   Chest pain 01/01/2016   Pseudotumor cerebri 01/01/2016   HTN  (hypertension) 01/01/2016   Obesity (BMI 30-39.9) 01/01/2016   Asthma 01/01/2016   GERD (gastroesophageal reflux disease) 01/01/2016   Chest pain syndrome 01/01/2016   Rectal bleeding 12/26/2015   Chest discomfort 12/26/2015   Frequent headaches 12/26/2015   Pain in joint, lower leg 04/11/2014   Stiffness of joint, not elsewhere classified, pelvic region and thigh 04/11/2014   Difficulty in walking(719.7) 04/11/2014   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   Rhabdomyolysis 06/27/2013   Hypokalemia 06/27/2013   Bruising 06/27/2013   ACHILLES TENDON RUPTURE 05/29/2008   Past Medical History:  Diagnosis Date   Anemia    Anginal pain ([place])    Pt has chest pains but pt states she was informed is related to acid reflux   Asthma  pt denies   Bronchitis    Chronic abdominal pain    Chronic bronchitis ([facility])    Chronic constipation    Chronic lower back pain    GERD (gastroesophageal reflux disease)    High cholesterol    History of blood clots    "in vein leading to my pancreas" (07/15/2016)   History of hiatal hernia    History of urinary tract infection    Hypertension    Migraine    "3-4 times/month" (07/15/2016)   Pneumonia ~ 2000 X 1   PONV (postoperative nausea and vomiting)    Pre-diabetes    Pseudotumor cerebri dx'd ealy 2000s   Seizures ([place])    in childhood / teenage years    Vision disturbance    "related to PTC"    Family History  Problem Relation Age of Onset   Migraines Mother    Cancer Maternal Uncle    Cancer Maternal Uncle    Cancer Paternal Grandmother    Cancer Paternal Grandfather    Colon cancer Other        unknown    Past Surgical History:  Procedure Laterality Date   ABDOMINAL HYSTERECTOMY  "early 2000s"   "partial"   ABDOMINOPLASTY     ACHILLES TENDON LENGTHENING Left    BREAST ENHANCEMENT SURGERY     COLONOSCOPY  2008   Dr. Gala Romney: friable anal canal, otherwise normal   COLONOSCOPY N/A 01/23/2016   [id number] hemorrhoids    DILATION AND CURETTAGE OF UTERUS     ESOPHAGOGASTRODUODENOSCOPY  2009   Dr. Gala Romney: normal    ESOPHAGOGASTRODUODENOSCOPY N/A 01/23/2016   FJU:VQQUIV esophagus/small HH   HERNIA REPAIR     HIATAL HERNIA REPAIR  06/23/2016   Procedure: LAPAROSCOPIC REPAIR OF HIATAL HERNIA;  Surgeon: Greer Pickerel, MD;  Location: [facility];  Service: General;;   LAPAROSCOPIC CHOLECYSTECTOMY     LAPAROSCOPIC GASTRIC SLEEVE RESECTION N/A 06/23/2016   Procedure: LAPAROSCOPIC GASTRIC SLEEVE RESECTION WITH UPPER ENDO;  Surgeon: Greer Pickerel, MD;  Location: [facility];  Service: General;  Laterality: N/A;   LIPOSUCTION     LUMBAR PUNCTURE  "several since early 20's"   vaginal trauma     secondary to fall in childhood / did have to have surgery pt not sure of details   Social History   Occupational History   Occupation: [employer]   Tobacco Use   Smoking status: Never   Smokeless tobacco: Never  Vaping Use   Vaping Use: Never used  Substance and Sexual Activity   Alcohol use: Yes    Alcohol/week: 0.0 standard drinks    Comment: occ   Drug use: Not on file   Sexual activity: Yes    Birth control/protection: Surgical

## 2021-05-09 ENCOUNTER — Other Ambulatory Visit: Payer: PRIVATE HEALTH INSURANCE

## 2023-03-23 ENCOUNTER — Ambulatory Visit (INDEPENDENT_AMBULATORY_CARE_PROVIDER_SITE_OTHER): Payer: Self-pay | Admitting: Family

## 2023-03-23 VITALS — BP 145/97 | Temp 97.2°F | Ht 70.0 in | Wt 230.2 lb

## 2023-03-23 DIAGNOSIS — I10 Essential (primary) hypertension: Secondary | ICD-10-CM

## 2023-03-23 DIAGNOSIS — E782 Mixed hyperlipidemia: Secondary | ICD-10-CM

## 2023-03-23 DIAGNOSIS — B3731 Acute candidiasis of vulva and vagina: Secondary | ICD-10-CM

## 2023-03-23 DIAGNOSIS — G932 Benign intracranial hypertension: Secondary | ICD-10-CM

## 2023-03-23 DIAGNOSIS — E669 Obesity, unspecified: Secondary | ICD-10-CM

## 2023-03-23 DIAGNOSIS — M2242 Chondromalacia patellae, left knee: Secondary | ICD-10-CM

## 2023-03-23 DIAGNOSIS — Z9884 Bariatric surgery status: Secondary | ICD-10-CM

## 2023-03-23 DIAGNOSIS — J3089 Other allergic rhinitis: Secondary | ICD-10-CM

## 2023-03-23 LAB — LAB REPORT - SCANNED
A1c: 5.6
EGFR: 90

## 2023-03-23 MED ORDER — NEXLETOL 180 MG PO TABS: 180.0000 mg | ORAL_TABLET | Freq: Every day | ORAL | 1 refills | Status: AC | PRN

## 2023-03-23 MED ORDER — AZELASTINE HCL 0.05 % OP SOLN: 1.0000 [drp] | Freq: Three times a day (TID) | OPHTHALMIC | 5 refills | Status: AC | PRN

## 2023-03-23 MED ORDER — MONTELUKAST SODIUM 10 MG PO TABS: 10.0000 mg | ORAL_TABLET | Freq: Every day | ORAL | 1 refills | Status: DC

## 2023-03-23 MED ORDER — CYANOCOBALAMIN 1000 MCG/ML IJ SOLN: 1000.0000 ug | INTRAMUSCULAR | 0 refills | Status: DC

## 2023-03-23 MED ORDER — BORIC ACID CRYS: 600.0000 mg | CRYSTALS | Freq: Every day | 5 refills | Status: AC

## 2023-03-23 MED ORDER — ACETAZOLAMIDE ER 500 MG PO CP12: 500.0000 mg | ORAL_CAPSULE | Freq: Two times a day (BID) | ORAL | 1 refills | Status: AC

## 2023-03-23 MED ORDER — CYANOCOBALAMIN 1000 MCG/ML IJ SOLN
1000.0000 ug | Freq: Once | INTRAMUSCULAR | Status: AC
  Administered 2023-03-23: 1000 ug via INTRAMUSCULAR

## 2023-03-23 MED ORDER — FLUCONAZOLE 150 MG PO TABS: ORAL_TABLET | ORAL | 0 refills | Status: AC

## 2023-03-23 MED ORDER — TOPIRAMATE 25 MG PO TABS: 25.0000 mg | ORAL_TABLET | Freq: Every day | ORAL | 1 refills | Status: AC

## 2023-03-23 MED ORDER — LEVOCETIRIZINE DIHYDROCHLORIDE 5 MG PO TABS: 5.0000 mg | ORAL_TABLET | Freq: Every day | ORAL | 1 refills | Status: DC

## 2023-03-23 NOTE — Progress Notes (Signed)
New Patient Office Visit  Subjective:  Patient ID: [name], female    DOB: 1976/10/01  Age: 47 y.o. MRN: [id number]  CC:  Chief Complaint  Patient presents with   New Patient (Initial Visit)   Fall    Pt c/o fall a month ago, Pt slipped at a truck stop and hit the back of her head. Pt states she was having head pains for while.    Knee Pain    Pt states she has knee pain in left knee. Pt states she use to get knee injections which did help sx. Pt is a truck driver, sitting for long periods of time.    Vaginal Itching    Pt c/o vaginal itching for the past couple months, Uses boric acid.    IIH    needs refills   Allergic Rhinitis     needs refills    HPI [name] presents for establishing care today.  Hypertension: Patient is currently maintained on the following medications for blood pressure: None. Failed meds include: Amlodipine, caused her BP to drop too low. Patient reports good compliance with blood pressure medications. Patient denies chest pain, headaches, shortness of breath or swelling. Last 3 blood pressure readings in our office are as follows: BP Readings from Last 3 Encounters:  03/23/23 (!) 145/97  03/04/21 127/84  11/19/18 (!) 143/102    Vaginal sx:  pt reports having BV for years then having yeast infections, uses Boric acid as preventative, but also has used douches.   Allergy hx:  environmental allergies mostly and has seen allergist in the past and needs refills today of Xyzal, Azelastine eye drops and Singulair.  Left knee pain:  used to work out to prevent stiffness, has hx of getting injections.  Would like to go back to [facility] for re-evaluation. pt drives a truck for long hauls.  IIH d/t Pseudotumor Cerebri/Headaches:  pt fell 3 weeks ago, slipped on some water at a truck stop and hit the back of her head but was on a route driving her truck and had to keep going. States she didn't pass out, but had nausea and dizziness. Pt also  reports running out of her Diamox & Topamax which could be contributing to sx, also has elevated BP today.   Assessment & Plan:  IIH (idiopathic intracranial hypertension) Assessment & Plan: chronic taking Diamox 500mg  ER bid & Topamax 25mg  every day reports having sx if she misses a dose of the Diamox sending refills f/u 81m  Orders: -     Topiramate; Take 1 tablet (25 mg total) by mouth daily.  Dispense: 90 tablet; Refill: 1 -     acetaZOLAMIDE ER; Take 1 capsule (500 mg total) by mouth 2 (two) times daily.  Dispense: 180 capsule; Refill: 1  Primary hypertension Assessment & Plan: Chronic, intermittent pt reports not taking any meds for over a year last time she took Amlodipine it caused her BP to drop too low & never gave her anything else c/o HA but she thinks it is from a fall she had on the back of her head as that is where her pain is; but also has run out of her Topamax and Diamox and that can cause her head to hurt advised on exercise, drinking more water, low sodium diet will continue to monitor   Chondromalacia of left patella -     Ambulatory referral to Orthopedic Surgery  S/P laparoscopic sleeve gastrectomy Assessment & Plan: sgy 06/2016  gives herself B12 shots monthly since requesting shot in- office today as has been without for a few months sending refills f/u 31m-59yr  Orders: -     Cyanocobalamin; Inject 1 mL (1,000 mcg total) into the muscle every 30 (thirty) days. DUE 08-2016  Dispense: 10 mL; Refill: 0 -     Cyanocobalamin  Hypercholesterolemia with hypertriglyceridemia Assessment & Plan: chronic taking Zetia 10mg  every day and Nexletol 180mg  every day sending refills states she is getting labs through her wt loss med provider and will get me a copy of results f/u 6 m- 52yr  Orders: -     Nexletol; Take 1 tablet (180 mg total) by mouth daily as needed.  Dispense: 90 tablet; Refill: 1  Non-seasonal allergic rhinitis due to other allergic  trigger Assessment & Plan: chronic taking Xyzal every day, Singulair, Azelastine eye drops sending refills f/u 43m-59yr  Orders: -     Montelukast Sodium; Take 1 tablet (10 mg total) by mouth daily.  Dispense: 90 tablet; Refill: 1 -     Levocetirizine Dihydrochloride; Take 1 tablet (5 mg total) by mouth daily.  Dispense: 90 tablet; Refill: 1 -     Azelastine HCl; Place 1 drop into both eyes 3 (three) times daily as needed (ALLERGIES).  Dispense: 6 mL; Refill: 5  Obesity (BMI 30-39.9) Assessment & Plan: chronic hx of gastric sleeve in 2017 has regained some weight going to a local provider for  [facility], paying out of pocket going to [facility] for full set of labs wt. Loss strategies reviewed including portion control, less carbs including sweets, eating most of calories earlier in day, drinking 64oz water qd, and establishing daily exercise routine.    Vaginal yeast infection -     Boric Acid; Place 600 mg vaginally at bedtime. Use vaginally every night for two weeks then twice a week  Dispense: 500 g; Refill: 5 -     Fluconazole; Take 1 pill today and the 2nd pill in 3 days.  Dispense: 2 tablet; Refill: 0    Subjective:    Outpatient Medications Prior to Visit  Medication Sig Dispense Refill   BIOTIN PO Take 1 tablet by mouth daily.     CALCIUM PO Take 1 tablet by mouth 3 (three) times daily.     Multiple Vitamin (MULTIVITAMIN WITH MINERALS) TABS tablet Take 1 tablet by mouth daily.     acetaZOLAMIDE ER (DIAMOX) 500 MG capsule Take 500 mg by mouth 2 (two) times daily.     azelastine (OPTIVAR) 0.05 % ophthalmic solution Place 1 drop into both eyes 3 (three) times daily as needed (ALLERGIES).      Boric Acid CRYS Place 600 mg vaginally at bedtime. Use vaginally every night for two weeks then twice a week 500 g 5   cyanocobalamin (,VITAMIN B-12,) 1000 MCG/ML injection Inject 1,000 mcg into the muscle every 30 (thirty) days. DUE 08-2016     levocetirizine (XYZAL) 5 MG tablet Take 5 mg  by mouth daily.     montelukast (SINGULAIR) 10 MG tablet Take 10 mg by mouth daily.     topiramate (TOPAMAX) 25 MG tablet Take 25 mg by mouth daily.     amLODipine (NORVASC) 5 MG tablet Take 1 tablet (5 mg total) by mouth daily. (Patient not taking: Reported on 03/23/2023) 30 tablet 3   fluticasone (FLONASE) 50 MCG/ACT nasal spray Place 2 sprays into both nostrils daily. (Patient not taking: Reported on 03/23/2023)     pantoprazole (PROTONIX) 40 MG tablet  Take 40 mg by mouth 2 (two) times daily. (Patient not taking: Reported on 03/23/2023)     No facility-administered medications prior to visit.   Past Medical History:  Diagnosis Date   Anemia    Anginal pain ([facility])    Pt has chest pains but pt states she was informed is related to acid reflux   Asthma    pt denies   Bronchitis    Chronic abdominal pain    Chronic bronchitis ([facility])    Chronic constipation    Chronic lower back pain    GERD (gastroesophageal reflux disease)    High cholesterol    History of blood clots    "in vein leading to my pancreas" (07/15/2016)   History of hiatal hernia    History of urinary tract infection    Hypertension    Low back pain 05/08/2021   Migraine    "3-4 times/month" (07/15/2016)   Pneumonia ~ 2000 X 1   PONV (postoperative nausea and vomiting)    Pre-diabetes    Pseudotumor cerebri dx'd ealy 2000s   Seizures ([facility])    in childhood / teenage years    Vision disturbance    "related to PTC"   Past Surgical History:  Procedure Laterality Date   ABDOMINAL HYSTERECTOMY  "early 2000s"   "partial"   ABDOMINOPLASTY     ACHILLES TENDON LENGTHENING Left    BREAST ENHANCEMENT SURGERY     COLONOSCOPY  2008   Dr. Jena Gauss: friable anal canal, otherwise normal   COLONOSCOPY N/A 01/23/2016   [id number] hemorrhoids   DILATION AND CURETTAGE OF UTERUS     ESOPHAGOGASTRODUODENOSCOPY  2009   Dr. Jena Gauss: normal    ESOPHAGOGASTRODUODENOSCOPY N/A 01/23/2016   HQI:ONGEXB esophagus/small [facility]   HERNIA REPAIR      HIATAL HERNIA REPAIR  06/23/2016   Procedure: LAPAROSCOPIC REPAIR OF HIATAL HERNIA;  Surgeon: Gaynelle Adu, MD;  Location: [facility];  Service: General;;   LAPAROSCOPIC CHOLECYSTECTOMY     LAPAROSCOPIC GASTRIC SLEEVE RESECTION N/A 06/23/2016   Procedure: LAPAROSCOPIC GASTRIC SLEEVE RESECTION WITH UPPER ENDO;  Surgeon: Gaynelle Adu, MD;  Location: [facility];  Service: General;  Laterality: N/A;   LIPOSUCTION     LUMBAR PUNCTURE  "several since early 20's"   vaginal trauma     secondary to fall in childhood / did have to have surgery pt not sure of details    Objective:   Today's Vitals: BP (!) 145/97   Temp (!) 97.2 F (36.2 C)   Ht 5\' 10"  (1.778 m)   Wt 230 lb 3.2 oz (104.4 kg)   LMP 11/11/2011   BMI 33.03 kg/m   Physical Exam Vitals and nursing note reviewed.  Constitutional:      Appearance: Normal appearance. She is obese.  Cardiovascular:     Rate and Rhythm: Normal rate and regular rhythm.  Pulmonary:     Effort: Pulmonary effort is normal.     Breath sounds: Normal breath sounds.  Musculoskeletal:     Left knee: Swelling present. Decreased range of motion. Tenderness present.  Skin:    General: Skin is warm and dry.  Neurological:     Mental Status: She is alert.  Psychiatric:        Mood and Affect: Mood normal.        Behavior: Behavior normal.    Meds ordered this encounter  Medications   cyanocobalamin (VITAMIN B12) 1000 MCG/ML injection    Sig: Inject 1 mL (1,000 mcg total) into the  muscle every 30 (thirty) days. DUE 08-2016    Dispense:  10 mL    Refill:  0   montelukast (SINGULAIR) 10 MG tablet    Sig: Take 1 tablet (10 mg total) by mouth daily.    Dispense:  90 tablet    Refill:  1   levocetirizine (XYZAL) 5 MG tablet    Sig: Take 1 tablet (5 mg total) by mouth daily.    Dispense:  90 tablet    Refill:  1   topiramate (TOPAMAX) 25 MG tablet    Sig: Take 1 tablet (25 mg total) by mouth daily.    Dispense:  90 tablet    Refill:  1   azelastine  (OPTIVAR) 0.05 % ophthalmic solution    Sig: Place 1 drop into both eyes 3 (three) times daily as needed (ALLERGIES).    Dispense:  6 mL    Refill:  5   Boric Acid CRYS    Sig: Place 600 mg vaginally at bedtime. Use vaginally every night for two weeks then twice a week    Dispense:  500 g    Refill:  5   acetaZOLAMIDE ER (DIAMOX) 500 MG capsule    Sig: Take 1 capsule (500 mg total) by mouth 2 (two) times daily.    Dispense:  180 capsule    Refill:  1   Bempedoic Acid (NEXLETOL) 180 MG TABS    Sig: Take 1 tablet (180 mg total) by mouth daily as needed.    Dispense:  90 tablet    Refill:  1   cyanocobalamin (VITAMIN B12) injection 1,000 mcg   fluconazole (DIFLUCAN) 150 MG tablet    Sig: Take 1 pill today and the 2nd pill in 3 days.    Dispense:  2 tablet    Refill:  0    Order Specific Question:   Supervising Provider    Answer:   ANDY, CAMILLE L [[id number]]   *Extra time spent ( ) with patient today which consisted of chart review, discussing diagnoses, work up, treatment, answering questions, and documentation.   Dulce Sellar, NP

## 2023-03-23 NOTE — Patient Instructions (Addendum)
Welcome to [facility], It was a pleasure meeting you today!   BE SURE TO FAX OR DROP OFF a copy of your [facility] results when you get them!  As discussed, I have sent your refills to your pharmacy.  I have sent a referral to [facility] that you used to see for your knee.  Please schedule a 6 month follow up visit today.     PLEASE NOTE: If you had any LAB tests please let us know if you have not heard back within a few days. You may see your results on MyChart before we have a chance to review them but we will give you a call once they are reviewed by [place]. If we ordered any REFERRALS today, please let us know if you have not heard from their office within the next week.  Let us know through MyChart if you are needing REFILLS, or have your pharmacy send [place] the request. You can also use MyChart to communicate with me or any office staff.  Please try these tips to maintain a healthy lifestyle: It is important that you exercise regularly at least 30 minutes 5 times a week. Think about what you will eat, plan ahead. Choose whole foods, & think  "clean, green, fresh or frozen" over canned, processed or packaged foods which are more sugary, salty, and fatty. 70 to 75% of food eaten should be fresh vegetables and protein. 2-3  meals daily with healthy snacks between meals, but must be whole fruit, protein or vegetables. Aim to eat over a 10 hour period when you are active, for example, 7am to 5pm, and then STOP after your last meal of the day, drinking only water.  Shorter eating windows, 6-8 hours, are showing benefits in heart disease and blood sugar regulation. Drink water every day! Shoot for 64 ounces daily = 8 cups, no other drink is as healthy! Fruit juice is best enjoyed in a healthy way, by EATING the fruit.

## 2023-03-24 ENCOUNTER — Other Ambulatory Visit: Payer: Self-pay

## 2023-03-24 ENCOUNTER — Ambulatory Visit (INDEPENDENT_AMBULATORY_CARE_PROVIDER_SITE_OTHER): Payer: Medicaid Other

## 2023-03-24 ENCOUNTER — Ambulatory Visit (INDEPENDENT_AMBULATORY_CARE_PROVIDER_SITE_OTHER): Payer: Medicaid Other | Admitting: Physician Assistant

## 2023-03-24 ENCOUNTER — Ambulatory Visit (INDEPENDENT_AMBULATORY_CARE_PROVIDER_SITE_OTHER): Payer: Medicaid Other | Admitting: Sports Medicine

## 2023-03-24 DIAGNOSIS — M25562 Pain in left knee: Secondary | ICD-10-CM | POA: Diagnosis not present

## 2023-03-24 DIAGNOSIS — M25512 Pain in left shoulder: Secondary | ICD-10-CM

## 2023-03-24 MED ORDER — LIDOCAINE HCL 1 % IJ SOLN
2.0000 mL | INTRAMUSCULAR | Status: AC | PRN
  Administered 2023-03-24: 2 mL

## 2023-03-24 MED ORDER — METHYLPREDNISOLONE ACETATE 40 MG/ML IJ SUSP
40.0000 mg | INTRAMUSCULAR | Status: AC | PRN
Start: 2023-03-24 — End: 2023-03-24
  Administered 2023-03-24: 40 mg via INTRA_ARTICULAR

## 2023-03-24 MED ORDER — LIDOCAINE HCL 1 % IJ SOLN
0.5000 mL | INTRAMUSCULAR | Status: AC | PRN
Start: 2023-03-24 — End: 2023-03-24
  Administered 2023-03-24: .5 mL

## 2023-03-24 MED ORDER — METHYLPREDNISOLONE ACETATE 40 MG/ML IJ SUSP
40.0000 mg | INTRAMUSCULAR | Status: AC | PRN
  Administered 2023-03-24: 40 mg via INTRA_ARTICULAR

## 2023-03-24 MED ORDER — BUPIVACAINE HCL 0.25 % IJ SOLN
2.0000 mL | INTRAMUSCULAR | Status: AC | PRN
  Administered 2023-03-24: 2 mL via INTRA_ARTICULAR

## 2023-03-24 NOTE — Progress Notes (Signed)
Office Visit Note   Patient: [name]           Date of Birth: 1976-06-13           MRN: [id number] Visit Date: 03/24/2023              Requested by: Dulce Sellar, NP [address] [place],  [place] [address] PCP: Dulce Sellar, NP   Assessment & Plan: Visit Diagnoses:  1. Left knee pain, unspecified chronicity   2. 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  In regards to the shoulder, we have discussed referral to Dr. Shon Baton for ultrasound-guided cortisone injection to the [facility] joint.  Regards to the left knee, we have discussed repeat cortisone injection for which she would like to proceed.  She will avoid exercises such as deep squats and lunges.  She will follow-up with [place] as needed.  Follow-Up Instructions: Return if symptoms worsen or fail to improve.   Orders:  Orders Placed This Encounter  Procedures   Large Joint Inj   XR KNEE 3 VIEW LEFT   XR Shoulder Left   No orders of the defined types were placed in this encounter.     Procedures: Large Joint Inj: L knee on 03/24/2023 9:31 AM Indications: pain Details: 22 G needle, anterolateral approach Medications: 2 mL lidocaine 1 %; 2 mL bupivacaine 0.25 %; 40 mg methylPREDNISolone acetate 40 MG/ML      Clinical Data: No additional findings.   Subjective: Chief Complaint  Patient presents with   Left Knee - Pain    HPI patient is a pleasant 47 year old female who comes in today with left knee pain and left shoulder pain.  In regards to her left knee, she has had pain off and on for a while.  This most recent episode began about 3 weeks ago after she is performing a squat challenge.  The pain she has is to the retropatellar aspect.  Symptoms are worse with lunges, squats and stair climbing.  Previous cortisone injections have helped.  In regards to her left shoulder, the pain is primarily to the [facility] joint  with some radiation into the proximal humerus.  Symptoms began about a year ago after she was lifting a tarp on a flat bed.  She does note her symptoms have improved.  Pain is worse with sleeping as well as with forward flexion.  She has been using Biofreeze and Tylenol with mild relief.  Review of Systems as detailed in HPI.  All others reviewed and are negative.   Objective: Vital Signs: LMP 11/11/2011   Physical Exam well-developed well-nourished female no acute distress.  Alert and oriented x 3.  Ortho Exam left shoulder exam reveals full active range of motion in all planes.  Internal rotation to T12.  Negative empty can, cross body adduction, speeds and O'Brien's test.  She does have moderate tenderness to the [facility] joint.  5 out of 5 strength throughout.  She is neurovascular intact distally.  Left knee exam shows no effusion.  Range of motion 0 to 125 degrees.  She does have slight pain with the extremes of flexion.  No joint line tenderness.  No patellar apprehension.  Mild patellofemoral crepitus.  She is neurovascularly intact distally.  Specialty Comments:  No specialty comments available.  Imaging: XR Shoulder Left  Result Date: 03/24/2023 Moderate AC degenerative changes.  No other acute or structural abnormalities.  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   IIH (idiopathic intracranial hypertension) 03/03/2016   Second degree hemorrhoids    Pseudotumor cerebri 01/01/2016   HTN (hypertension) 01/01/2016   Obesity (BMI 30-39.9) 01/01/2016   Asthma 01/01/2016   GERD (gastroesophageal reflux disease) 01/01/2016   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   ACHILLES TENDON  RUPTURE 05/29/2008   Past Medical History:  Diagnosis Date   Anemia    Anginal pain (HCC)    Pt has chest pains but pt states she was informed is related to acid reflux   Asthma    pt denies   Bronchitis    Chronic abdominal pain    Chronic bronchitis (HCC)    Chronic constipation    Chronic lower back pain    GERD (gastroesophageal reflux disease)    High cholesterol    History of blood clots    "in vein leading to my pancreas" (07/15/2016)   History of hiatal hernia    History of urinary tract infection    Hypertension    Migraine    "3-4 times/month" (07/15/2016)   Pneumonia ~ 2000 X 1   PONV (postoperative nausea and vomiting)    Pre-diabetes    Pseudotumor cerebri dx'd ealy 2000s   Seizures (HCC)    in childhood / teenage years    Vision disturbance    "related to PTC"    Family History  Problem Relation Age of Onset   Migraines Mother    Cancer Maternal Uncle    Cancer Maternal Uncle    Cancer Paternal Grandmother    Cancer Paternal Grandfather    Colon cancer Other        unknown    Past Surgical History:  Procedure Laterality Date   ABDOMINAL HYSTERECTOMY  "early 2000s"   "partial"   ABDOMINOPLASTY     ACHILLES TENDON LENGTHENING Left    BREAST ENHANCEMENT SURGERY     COLONOSCOPY  2008   Dr. Jena Gauss: friable anal canal, otherwise normal   COLONOSCOPY N/A 01/23/2016   [id number] hemorrhoids   DILATION AND CURETTAGE OF UTERUS     ESOPHAGOGASTRODUODENOSCOPY  2009   Dr. Jena Gauss: normal    ESOPHAGOGASTRODUODENOSCOPY N/A 01/23/2016   JWJ:XBJYNW esophagus/small HH   HERNIA REPAIR     HIATAL HERNIA REPAIR  06/23/2016   Procedure: LAPAROSCOPIC REPAIR OF HIATAL HERNIA;  Surgeon: Gaynelle Adu, MD;  Location: [facility];  Service: General;;   LAPAROSCOPIC CHOLECYSTECTOMY     LAPAROSCOPIC GASTRIC SLEEVE RESECTION N/A 06/23/2016   Procedure: LAPAROSCOPIC GASTRIC SLEEVE RESECTION WITH UPPER ENDO;  Surgeon: Gaynelle Adu, MD;  Location: [facility];  Service: General;   Laterality: N/A;   LIPOSUCTION     LUMBAR PUNCTURE  "several since early 20's"   vaginal trauma     secondary to fall in childhood / did have to have surgery pt not sure of details   Social History   Occupational History   Occupation: [employer]   Tobacco Use   Smoking status: Never   Smokeless tobacco: Never  Vaping Use   Vaping Use: Never used  Substance and Sexual Activity   Alcohol use: Yes    Alcohol/week: 0.0 standard drinks of alcohol    Comment:  occ   Drug use: Not on file   Sexual activity: Yes    Birth control/protection: Surgical

## 2023-03-24 NOTE — Progress Notes (Signed)
   Procedure Note  Patient: [name]             Date of Birth: April 08, 1976           MRN: [id number]             Visit Date: 03/24/2023  Procedures: Visit Diagnoses:  1. Arthralgia of left acromioclavicular joint   2. Left shoulder pain, unspecified chronicity    Medium Joint Inj: L acromioclavicular on 03/24/2023 9:40 AM Indications: pain Details: 25 G 1.5 in needle, ultrasound-guided anterior approach Medications: 0.5 mL lidocaine 1 %; 40 mg methylPREDNISolone acetate 40 MG/ML  US-guided AC Joint injection, left shoulder After discussion on risks/benefits/indications, informed verbal consent was obtained. A timeout was then performed. The patient was seated in examination room. The area overlying the [facility] joint of the shoulder was prepped with Betadine and alcohol swab then utilizing ultrasound guidance, patient's AC joint was injected using a 25G, 1.5" needle with 0.5:1.73mL lidocaine:depomedrol of injectate via an out-of-plane, walk-down approach. Visualization of injectate flow was noted under ultrasound guidance. Patient tolerated the procedure well.  There was a small degree of leakage with the first injection, we did have to remove the syringe sterilely and replaced into the needle for the injection.  Ultrasound then did show good visualization of injectate into the joint.   Procedure, treatment alternatives, risks and benefits explained, specific risks discussed. Consent was given by the patient. Immediately prior to procedure a time out was called to verify the correct patient, procedure, equipment, support staff and site/side marked as required. Patient was prepped and draped in the usual sterile fashion.     - I evaluated the patient about 10 minutes post-injection and they had improvement in pain and range of motion - follow-up with Dr. Roda Shutters and Mardella Layman as indicated; I am happy to see them as needed  Madelyn Brunner, DO Primary Care Sports Medicine Physician  [facility] - Orthopedics  This note was dictated using Dragon naturally speaking software and may contain errors in syntax, spelling, or content which have not been identified prior to signing this note.

## 2023-03-25 ENCOUNTER — Other Ambulatory Visit: Payer: Self-pay | Admitting: Family

## 2023-03-26 ENCOUNTER — Encounter: Payer: Self-pay | Admitting: Family

## 2023-03-26 DIAGNOSIS — J309 Allergic rhinitis, unspecified: Secondary | ICD-10-CM | POA: Insufficient documentation

## 2023-03-26 NOTE — Assessment & Plan Note (Addendum)
chronic taking Diamox 500mg  ER bid & Topamax 25mg  every day reports having sx if she misses a dose of the Diamox sending refills f/u 45m

## 2023-03-26 NOTE — Assessment & Plan Note (Signed)
sgy 06/2016 gives herself B12 shots monthly since requesting shot in- office today as has been without for a few months sending refills f/u 20m-18yr

## 2023-03-26 NOTE — Assessment & Plan Note (Signed)
chronic taking Zetia 10mg  every day and Nexletol 180mg  every day sending refills states she is getting labs through her wt loss med provider and will get me a copy of results f/u 6 m- 19yr

## 2023-03-26 NOTE — Assessment & Plan Note (Signed)
chronic hx of gastric sleeve in 2017 has regained some weight going to a local provider for  [facility], paying out of pocket going to [facility] for full set of labs wt. Loss strategies reviewed including portion control, less carbs including sweets, eating most of calories earlier in day, drinking 64oz water qd, and establishing daily exercise routine.

## 2023-03-26 NOTE — Assessment & Plan Note (Signed)
Chronic, intermittent pt reports not taking any meds for over a year last time she took Amlodipine it caused her BP to drop too low & never gave her anything else c/o HA but she thinks it is from a fall she had on the back of her head as that is where her pain is; but also has run out of her Topamax and Diamox and that can cause her head to hurt advised on exercise, drinking more water, low sodium diet will continue to monitor

## 2023-03-26 NOTE — Assessment & Plan Note (Signed)
chronic taking Xyzal every day, Singulair, Azelastine eye drops sending refills f/u 83m-61yr

## 2023-05-02 ENCOUNTER — Other Ambulatory Visit: Payer: Self-pay | Admitting: Family

## 2023-05-02 DIAGNOSIS — B3731 Acute candidiasis of vulva and vagina: Secondary | ICD-10-CM

## 2023-05-04 ENCOUNTER — Telehealth: Payer: Self-pay | Admitting: Family

## 2023-05-04 NOTE — Telephone Encounter (Signed)
Prescription Request  05/04/2023  LOV: 03/23/2023  What is the name of the medication or equipment?  fluconazole (DIFLUCAN) 150 MG tablet   Have you contacted your pharmacy to request a refill? Yes   Which pharmacy would you like this sent to? [id number] - [place], [place] - [address] [place] [address] Phone: [phone] Fax: [phone]   Patient notified that their request is being sent to the clinical staff for review and that they should receive a response within 2 business days.   Please advise at Mobile [phone] (mobile)

## 2023-05-04 NOTE — Telephone Encounter (Signed)
She needs to come in for testing. Is she still using the boric acid vaginal tabs to prevent yeast? I sent refills for that med.

## 2023-05-05 NOTE — Telephone Encounter (Signed)
Tried calling patient, unable to leave a message, mailbox full.

## 2023-09-10 ENCOUNTER — Other Ambulatory Visit: Payer: Self-pay | Admitting: Family

## 2023-09-10 DIAGNOSIS — G932 Benign intracranial hypertension: Secondary | ICD-10-CM

## 2023-09-10 DIAGNOSIS — J3089 Other allergic rhinitis: Secondary | ICD-10-CM

## 2024-01-21 ENCOUNTER — Other Ambulatory Visit: Payer: Self-pay | Admitting: Family

## 2024-01-21 DIAGNOSIS — Z9884 Bariatric surgery status: Secondary | ICD-10-CM

## 2024-01-21 DIAGNOSIS — J3089 Other allergic rhinitis: Secondary | ICD-10-CM

## 2024-08-03 ENCOUNTER — Other Ambulatory Visit: Payer: Self-pay | Admitting: Family Medicine

## 2024-08-03 DIAGNOSIS — Z1231 Encounter for screening mammogram for malignant neoplasm of breast: Secondary | ICD-10-CM

## 2024-08-04 ENCOUNTER — Telehealth: Payer: Self-pay

## 2024-08-04 ENCOUNTER — Other Ambulatory Visit: Payer: Self-pay

## 2024-08-04 DIAGNOSIS — Z1211 Encounter for screening for malignant neoplasm of colon: Secondary | ICD-10-CM

## 2024-08-04 MED ORDER — NA SULFATE-K SULFATE-MG SULF 17.5-3.13-1.6 GM/177ML PO SOLN: 1.0000 | Freq: Once | ORAL | 0 refills | Status: AC

## 2024-08-04 NOTE — Telephone Encounter (Signed)
 Gastroenterology Pre-Procedure Review  Request Date: 10/10/24 Requesting Physician: Dr. Melany  PATIENT REVIEW QUESTIONS: The patient responded to the following health history questions as indicated:    1. Are you having any GI issues? no 2. Do you have a personal history of Polyps? no 3. Do you have a family history of Colon Cancer or Polyps? no 4. Diabetes Mellitus? no 5. Joint replacements in the past 12 months?no 6. Major health problems in the past 3 months?no 7. Any artificial heart valves, MVP, or defibrillator?no 8. Weight mgm meds? Yes Mounjaro pt has been advised to stop 7 days prior    MEDICATIONS & ALLERGIES:    Patient reports the following regarding taking any anticoagulation/antiplatelet therapy:   Plavix, Coumadin, Eliquis, Xarelto, Lovenox , Pradaxa, Brilinta, or Effient? no Aspirin ? no  Patient confirms/reports the following medications:  Current Outpatient Medications  Medication Sig Dispense Refill   acetaZOLAMIDE  ER (DIAMOX ) 500 MG capsule Take 1 capsule (500 mg total) by mouth 2 (two) times daily. 180 capsule 1   azelastine  (OPTIVAR ) 0.05 % ophthalmic solution Place 1 drop into both eyes 3 (three) times daily as needed (ALLERGIES). 6 mL 5   Bempedoic Acid  (NEXLETOL ) 180 MG TABS Take 1 tablet (180 mg total) by mouth daily as needed. 90 tablet 1   BIOTIN PO Take 1 tablet by mouth daily.     Boric Acid CRYS Place 600 mg vaginally at bedtime. Use vaginally every night for two weeks then twice a week 500 g 5   CALCIUM  PO Take 1 tablet by mouth 3 (three) times daily.     cyanocobalamin  (VITAMIN B12) 1000 MCG/ML injection INJECT 1 ML INTO THE MUSCLE EVERY 30 DAYS 10 mL 0   ezetimibe (ZETIA) 10 MG tablet TAKE 1 TABLET BY MOUTH EVERY DAY 90 tablet 0   fluconazole  (DIFLUCAN ) 150 MG tablet Take 1 pill today and the 2nd pill in 3 days. 2 tablet 0   levocetirizine (XYZAL ) 5 MG tablet TAKE 1 TABLET(5 MG) BY MOUTH DAILY 90 tablet 1   montelukast  (SINGULAIR ) 10 MG tablet TAKE 1  TABLET(10 MG) BY MOUTH DAILY 90 tablet 1   Multiple Vitamin (MULTIVITAMIN WITH MINERALS) TABS tablet Take 1 tablet by mouth daily.     topiramate  (TOPAMAX ) 25 MG tablet Take 1 tablet (25 mg total) by mouth daily. 90 tablet 1   No current facility-administered medications for this visit.    Patient confirms/reports the following allergies:  No Known Allergies  No orders of the defined types were placed in this encounter.   AUTHORIZATION INFORMATION Primary Insurance: 1D#: Group #:  Secondary Insurance: 1D#: Group #:  SCHEDULE INFORMATION: Date: 10/10/14 Time: Location: [facility]

## 2024-09-19 ENCOUNTER — Telehealth: Payer: Self-pay

## 2024-09-19 NOTE — Telephone Encounter (Signed)
Pt need to r/s procedure

## 2024-09-19 NOTE — Telephone Encounter (Signed)
 Returned phone call to reschedule patient's 10/24/24 colonoscopy at [facility].  LVM for her to call me back.  Thanks,  [place], CMA

## 2024-09-22 NOTE — Telephone Encounter (Signed)
 Canceled 10/24/24 colonoscopy.  Call patient back in Feb to schedule for April colonoscopy.  Thanks,  [place], CMA

## 2024-09-27 ENCOUNTER — Ambulatory Visit
Admission: RE | Admit: 2024-09-27 | Discharge: 2024-09-27 | Disposition: A | Discharge status: Home / Self Care | Source: Ambulatory Visit | Attending: Family Medicine | Admitting: Family Medicine

## 2024-09-27 DIAGNOSIS — Z1231 Encounter for screening mammogram for malignant neoplasm of breast: Secondary | ICD-10-CM | POA: Diagnosis present

## 2024-10-12 ENCOUNTER — Other Ambulatory Visit: Payer: Self-pay | Admitting: Family Medicine

## 2024-10-12 DIAGNOSIS — R928 Other abnormal and inconclusive findings on diagnostic imaging of breast: Secondary | ICD-10-CM

## 2024-10-24 ENCOUNTER — Ambulatory Visit: Admit: 2024-10-24 | Admitting: Gastroenterology

## 2024-10-24 SURGERY — COLONOSCOPY
Anesthesia: Choice

## 2024-11-11 NOTE — Telephone Encounter (Signed)
 Left voice message with patient to inquire if she would like to move forward with rescheduling her colonoscopy for the month of April.  Will await callback to see if she would like to schedule.  Thanks,  [place], CMA
